# Patient Record
Sex: Male | Born: 1955 | Hispanic: No | Marital: Married | Smoking: Never smoker
Health system: Southern US, Community
[De-identification: ages and names within clinical notes are randomized; demographics above are authoritative.]

## PROBLEM LIST (undated history)

## (undated) DIAGNOSIS — I2510 Atherosclerotic heart disease of native coronary artery without angina pectoris: Secondary | ICD-10-CM

## (undated) DIAGNOSIS — I213 ST elevation (STEMI) myocardial infarction of unspecified site: Secondary | ICD-10-CM

## (undated) DIAGNOSIS — J302 Other seasonal allergic rhinitis: Secondary | ICD-10-CM

## (undated) DIAGNOSIS — I219 Acute myocardial infarction, unspecified: Secondary | ICD-10-CM

## (undated) DIAGNOSIS — E785 Hyperlipidemia, unspecified: Secondary | ICD-10-CM

## (undated) DIAGNOSIS — I10 Essential (primary) hypertension: Secondary | ICD-10-CM

## (undated) DIAGNOSIS — T7840XA Allergy, unspecified, initial encounter: Secondary | ICD-10-CM

## (undated) HISTORY — DX: Essential (primary) hypertension: I10

## (undated) HISTORY — DX: Other seasonal allergic rhinitis: J30.2

## (undated) HISTORY — DX: Hyperlipidemia, unspecified: E78.5

## (undated) HISTORY — DX: Allergy, unspecified, initial encounter: T78.40XA

## (undated) HISTORY — DX: Acute myocardial infarction, unspecified: I21.9

## (undated) HISTORY — DX: Atherosclerotic heart disease of native coronary artery without angina pectoris: I25.10

---

## 2000-08-23 HISTORY — PX: KNEE ARTHROSCOPY: SUR90

## 2005-07-01 ENCOUNTER — Ambulatory Visit (HOSPITAL_COMMUNITY): Admission: RE | Admit: 2005-07-01 | Discharge: 2005-07-01 | Payer: Self-pay | Admitting: Family Medicine

## 2006-01-12 ENCOUNTER — Ambulatory Visit (HOSPITAL_COMMUNITY): Admission: RE | Admit: 2006-01-12 | Discharge: 2006-01-12 | Payer: Self-pay | Admitting: Family Medicine

## 2006-03-01 ENCOUNTER — Ambulatory Visit (HOSPITAL_COMMUNITY): Admission: RE | Admit: 2006-03-01 | Discharge: 2006-03-01 | Payer: Self-pay | Admitting: Urology

## 2008-03-28 ENCOUNTER — Ambulatory Visit (HOSPITAL_COMMUNITY): Admission: RE | Admit: 2008-03-28 | Discharge: 2008-03-28 | Payer: Self-pay | Admitting: Family Medicine

## 2011-08-24 HISTORY — PX: HERNIA REPAIR: SHX51

## 2013-01-03 ENCOUNTER — Telehealth: Payer: Self-pay

## 2013-01-03 NOTE — Telephone Encounter (Signed)
LMOM to call. ( Pt was referred by Dr. Regino Schultze for screening colonoscopy).

## 2013-01-18 NOTE — Telephone Encounter (Signed)
LMOM to call.

## 2013-01-19 NOTE — Telephone Encounter (Signed)
Letter to pt and PCP.  

## 2015-01-01 ENCOUNTER — Telehealth: Payer: Self-pay | Admitting: *Deleted

## 2015-01-01 ENCOUNTER — Encounter: Payer: Self-pay | Admitting: Neurology

## 2015-01-01 ENCOUNTER — Ambulatory Visit (INDEPENDENT_AMBULATORY_CARE_PROVIDER_SITE_OTHER): Payer: BLUE CROSS/BLUE SHIELD | Admitting: Neurology

## 2015-01-01 VITALS — BP 139/86 | HR 81 | Ht 66.5 in | Wt 166.0 lb

## 2015-01-01 DIAGNOSIS — G20 Parkinson's disease: Secondary | ICD-10-CM

## 2015-01-01 DIAGNOSIS — R251 Tremor, unspecified: Secondary | ICD-10-CM

## 2015-01-01 MED ORDER — PROPRANOLOL HCL ER 80 MG PO CP24: 80.0000 mg | ORAL_CAPSULE | Freq: Every day | ORAL | Status: DC

## 2015-01-01 NOTE — Telephone Encounter (Signed)
Patient notes on [facility] desk.

## 2015-01-01 NOTE — Patient Instructions (Signed)
Overall you are doing fairly well but I do want to suggest a few things today:   Remember to drink plenty of fluid, eat healthy meals and do not skip any meals. Try to eat protein with a every meal and eat a healthy snack such as fruit or nuts in between meals. Try to keep a regular sleep-wake schedule and try to exercise daily, particularly in the form of walking, 20-30 minutes a day, if you can.   As far as your medications are concerned, I would like to suggest: Propranolol ER daily. Stop caffeine  As far as diagnostic testing: MRI of the brain, labs  I would like to see you back in 4 months, sooner if we need to. Please call us with any interim questions, concerns, problems, updates or refill requests.   Please also call us for any test results so we can go over those with you on the phone.  My clinical assistant and will answer any of your questions and relay your messages to me and also relay most of my messages to you.   Our phone number is [phone]. We also have an after hours call service for urgent matters and there is a physician on-call for urgent questions. For any emergencies you know to call 911 or go to the nearest emergency room

## 2015-01-01 NOTE — Progress Notes (Signed)
[facility]    Provider:  Dr Lucia GaskinsAhern Referring Provider: [name], Juan C, MD Primary Care Physician:  Juan RibasGOLDING, Juan CABOT, MD  CC:  Tremors  HPI:  Juan Schultz is a 59 y.o. male here as a referral from Dr. Hassan RowanKoberlein for Tremors  Just the left hand, been years. Patient unsure if it is worsening. He sometimes will shake so much wll spill drink. He drinks 2 cups caffeine a day and drinks it every night but more decaf or half caf. It is every day, can be mild or higher frequency. Usually a baseline tremor but worsens sometimes. Could be caffeine or anxiety. Still has good smell. No constipation, no dry mouth, no ED, no orthostatic hypotension. Shuffles sometimes. He feels it even at rest. Doesn't drink alcohol. No tremors in the family. Uses singular sparingly. No history of dopa blocking medications. No FHX parkinson's disease. No inciting factors, no previous head trauma.  Review of Systems: Patient complains of symptoms per HPI as well as the following symptoms: Fatigue, snoring, allergies. Pertinent negatives per HPI. All others negative.   History   Social History  . Marital Status: Married    Spouse Name: Juan Schultz  . Number of Children: 2  . Years of Education: College   Occupational History  . [employer]    Social History Main Topics  . Smoking status: Never Smoker   . Smokeless tobacco: Not on file  . Alcohol Use: No     Comment: Quit in 1981  . Drug Use: No  . Sexual Activity: Not on file   Other Topics Concern  . Not on file   Social History Narrative   Lives at home with wife and son.   Caffeine use: 1 cups coffee per day   Drinks tea occass    No family history on file.  Past Medical History  Diagnosis Date  . Seasonal allergies     Past Surgical History  Procedure Laterality Date  . Hernia repair  2013  . Knee arthroscopy Right 2002    Current Outpatient Prescriptions  Medication Sig Dispense Refill  . Montelukast  Sodium (SINGULAIR PO) Take 5 mg by mouth as needed.    Marland Kitchen. EPIPEN 2-PAK 0.3 MG/0.3ML SOAJ injection     . propranolol ER (INDERAL LA) 80 MG 24 hr capsule Take 1 capsule (80 mg total) by mouth daily. 30 capsule 6   No current facility-administered medications for this visit.    Allergies as of 01/01/2015 - Review Complete 01/01/2015  Allergen Reaction Noted  . Tequin [gatifloxacin]  01/01/2015    Vitals: BP 139/86 mmHg  Pulse 81  Ht 5' 6.5" (1.689 m)  Wt 166 lb (75.297 kg)  BMI 26.39 kg/m2 Last Weight:  Wt Readings from Last 1 Encounters:  01/01/15 166 lb (75.297 kg)   Last Height:   Ht Readings from Last 1 Encounters:  01/01/15 5' 6.5" (1.689 m)   Physical exam: Exam: Gen: NAD, conversant, well nourised, well groomed                     CV: RRR, no MRG. No Carotid Bruits. No peripheral edema, warm, nontender Eyes: Conjunctivae clear without exudates or hemorrhage  Neuro: Detailed Neurologic Exam  Speech:    Speech is normal; fluent and spontaneous with normal comprehension.  Cognition:    The patient is oriented to person, place, and time;     recent and remote memory intact;     language fluent;  normal attention, concentration,     fund of knowledge Cranial Nerves:    The pupils are equal, round, and reactive to light. The fundi are normal and spontaneous venous pulsations are present. Visual fields are full to finger confrontation. Extraocular movements are intact. Trigeminal sensation is intact and the muscles of mastication are normal. The face is symmetric. The palate elevates in the midline. Hearing intact. Voice is normal. Shoulder shrug is normal. The tongue has normal motion without fasciculations.   Coordination:     Normal rapid alternating movements.   Gait:    Heel-toe and tandem gait are normal.   Motor Observation:    Left > right postural tremor which increases with FTN. . Tone:    Normal muscle tone.    Posture:    Posture is normal.  normal erect    Strength:    Strength is V/V in the upper and lower limbs.      Sensation: intact to LT     Reflex Exam:  DTR's:    Deep tendon reflexes in the upper and lower extremities are normal bilaterally.   Toes:    The toes are downgoing bilaterally.   Clonus:    Clonus is absent.   Assessment/Plan:  59 year old male with left greater than right tremor. Drinks multiple cups of caffeine every day. Advised to stop caffeine one day and see if the tremor improves. No other signs or symptoms of parkinsonism. We'll try propranolol extended release. MRI of the brain. We'll order thyroid and copper studies.  Need recent labs from primary care, will request.   Naomie DeanAntonia Laurice Kimmons, MD  [facility] [address] [place], [place] [id number]  Phone [phone] Fax [phone]

## 2015-01-01 NOTE — Telephone Encounter (Signed)
Called patient and verified he had lab work done in our office this morning. He stated he did.

## 2015-01-02 ENCOUNTER — Telehealth: Payer: Self-pay

## 2015-01-02 NOTE — Telephone Encounter (Signed)
Pt was referred by Dr. Phillips OdorGolding for screening colonoscopy. He left VM he is ready to schedule. I called and LMOM to call.

## 2015-01-03 LAB — THYROID PANEL WITH TSH
Free Thyroxine Index: 2.1 (ref 1.2–4.9)
T3 Uptake Ratio: 30 % (ref 24–39)
T4, Total: 7.1 ug/dL (ref 4.5–12.0)
TSH: 3.42 u[IU]/mL (ref 0.450–4.500)

## 2015-01-03 LAB — COPPER, SERUM: Copper: 98 ug/dL (ref 72–166)

## 2015-01-03 LAB — CERULOPLASMIN: Ceruloplasmin: 23.7 mg/dL (ref 16.0–31.0)

## 2015-01-05 ENCOUNTER — Encounter: Payer: Self-pay | Admitting: Neurology

## 2015-01-07 ENCOUNTER — Telehealth: Payer: Self-pay | Admitting: Neurology

## 2015-01-07 ENCOUNTER — Telehealth: Payer: Self-pay

## 2015-01-07 NOTE — Telephone Encounter (Signed)
lvm to schedule mri

## 2015-01-07 NOTE — Telephone Encounter (Signed)
Patient was informed of normal labs, he has no questions at this time.  Thanks

## 2015-01-15 ENCOUNTER — Ambulatory Visit (INDEPENDENT_AMBULATORY_CARE_PROVIDER_SITE_OTHER): Payer: BLUE CROSS/BLUE SHIELD

## 2015-01-15 ENCOUNTER — Other Ambulatory Visit: Payer: BLUE CROSS/BLUE SHIELD

## 2015-01-15 DIAGNOSIS — G20 Parkinson's disease: Secondary | ICD-10-CM

## 2015-01-15 DIAGNOSIS — R251 Tremor, unspecified: Secondary | ICD-10-CM | POA: Diagnosis not present

## 2015-01-16 NOTE — Telephone Encounter (Signed)
LMOM to call.

## 2015-01-21 ENCOUNTER — Telehealth: Payer: Self-pay

## 2015-01-21 ENCOUNTER — Other Ambulatory Visit: Payer: Self-pay

## 2015-01-21 DIAGNOSIS — Z1211 Encounter for screening for malignant neoplasm of colon: Secondary | ICD-10-CM

## 2015-01-21 NOTE — Telephone Encounter (Signed)
Gastroenterology Pre-Procedure Review  Request Date: 01/16/2015 Requesting Physician: Dr. Phillips OdorGolding  PATIENT REVIEW QUESTIONS: The patient responded to the following health history questions as indicated:    1. Diabetes Melitis: no 2. Joint replacements in the past 12 months: no 3. Major health problems in the past 3 months: no 4. Has an artificial valve or MVP: no 5. Has a defibrillator: no 6. Has been advised in past to take antibiotics in advance of a procedure like teeth cleaning: no    MEDICATIONS & ALLERGIES:    Patient reports the following regarding taking any blood thinners:   Plavix? no Aspirin? no Coumadin? no  Patient confirms/reports the following medications:  Current Outpatient Prescriptions  Medication Sig Dispense Refill  . Montelukast Sodium (SINGULAIR PO) Take 5 mg by mouth as needed.    Marland Kitchen. EPIPEN 2-PAK 0.3 MG/0.3ML SOAJ injection     . propranolol ER (INDERAL LA) 80 MG 24 hr capsule Take 1 capsule (80 mg total) by mouth daily. (Patient not taking: Reported on 01/16/2015) 30 capsule 6   No current facility-administered medications for this visit.    Patient confirms/reports the following allergies:  Allergies  Allergen Reactions  . Tequin [Gatifloxacin]     Mouth got numb    No orders of the defined types were placed in this encounter.    AUTHORIZATION INFORMATION Primary Insurance:   ID #:  Group #:  Pre-Cert / Auth required:  Pre-Cert / Auth #:   Secondary Insurance:  ID #:   Group #:  Pre-Cert / Auth required: Pre-Cert / Auth #:  SCHEDULE INFORMATION: Procedure has been scheduled as follows:  Date:    03/14/2015                Time: 8:30 AM  [facility]  This Gastroenterology Pre-Precedure Review Form is being routed to the following provider(s): Jonette EvaSandi Fields, MD

## 2015-01-21 NOTE — Telephone Encounter (Signed)
Pt was informed of Normal MRI

## 2015-01-21 NOTE — Telephone Encounter (Signed)
VM left to inform patient of normal MRI, he been instructed to call office back.

## 2015-01-22 NOTE — Telephone Encounter (Signed)
SUPREP SPLIT DOSING- FULL LIQUID WITH BREAKFAST.   Full Liquid Diet A high-calorie, high-protein supplement should be used to meet your nutritional requirements when the full liquid diet is continued for more than 2 or 3 days. If this diet is to be used for an extended period of time (more than 7 days), a multivitamin should be considered.  Breads and Starches  Allowed: None are allowed except crackers WHOLE OR pureed (made into a thick, smooth soup) in soup.   Avoid: Any others.    Potatoes/Pasta/Rice  Allowed: ANY ITEM AS A SOUP OR SMALL PLATE OF MASHED POTATOES.       Vegetables  Allowed: Strained tomato or vegetable juice. Vegetables pureed in soup.   Avoid: Any others.    Fruit  Allowed: Any strained fruit juices and fruit drinks. Include 1 serving of citrus or vitamin C-enriched fruit juice daily.   Avoid: Any others.  Meat and Meat Substitutes  Allowed: Egg  Avoid: Any meat, fish, or fowl. All cheese.  Milk  Allowed: Milk beverages, including milk shakes and instant breakfast mixes. Smooth yogurt.   Avoid: Any others. Avoid dairy products if not tolerated.    Soups and Combination Foods  Allowed: Broth, strained cream soups. Strained, broth-based soups.   Avoid: Any others.    Desserts and Sweets  Allowed: flavored gelatin,plain ice cream, sherbet, smooth pudding, junket, fruit ices, frozen ice pops, pudding pops,, frozen fudge pops, chocolate syrup. Sugar, honey, jelly, syrup.   Avoid: Any others.  Fats and Oils  Allowed: Margarine, butter, cream, sour cream, oils.   Avoid: Any others.  Beverages  Allowed: All.   Avoid: None.  Condiments  Allowed: Iodized salt, pepper, spices, flavorings. Cocoa powder.   Avoid: Any others.    SAMPLE MEAL PLAN Breakfast   cup orange juice.   1 OR 2 EGGS   1 cup  milk.   1 cup beverage (coffee or tea).   Cream or sugar, if desired.    Midmorning Snack  2 SCRAMBLED OR HARD BOILED  EGG   Lunch  1 cup cream soup.    cup fruit juice.   1 cup milk.    cup custard.   1 cup beverage (coffee or tea).   Cream or sugar, if desired.    Midafternoon Snack  1 cup milk shake.  Dinner  1 cup cream soup.    cup fruit juice.   1 cup milk.    cup pudding.   1 cup beverage (coffee or tea).   Cream or sugar, if desired.  Evening Snack  1 cup supplement.  To increase calories, add sugar, cream, butter, or margarine if possible. Nutritional supplements will also increase the total calories.

## 2015-01-23 MED ORDER — NA SULFATE-K SULFATE-MG SULF 17.5-3.13-1.6 GM/177ML PO SOLN: 1.0000 | ORAL | Status: DC

## 2015-01-23 NOTE — Telephone Encounter (Signed)
Rx sent to the pharmacy and instructions mailed to pt.  

## 2015-01-31 ENCOUNTER — Ambulatory Visit: Payer: Self-pay | Admitting: Cardiology

## 2015-02-07 ENCOUNTER — Ambulatory Visit (INDEPENDENT_AMBULATORY_CARE_PROVIDER_SITE_OTHER): Payer: BLUE CROSS/BLUE SHIELD | Admitting: Internal Medicine

## 2015-02-07 VITALS — BP 104/76 | HR 78 | Ht 66.5 in | Wt 165.0 lb

## 2015-02-07 DIAGNOSIS — Z136 Encounter for screening for cardiovascular disorders: Secondary | ICD-10-CM

## 2015-02-07 DIAGNOSIS — Z8249 Family history of ischemic heart disease and other diseases of the circulatory system: Secondary | ICD-10-CM | POA: Diagnosis not present

## 2015-02-07 NOTE — Patient Instructions (Signed)
Your physician recommends that you schedule a follow-up appointment in: to be determined    Please schedule Cardiac CT for calcium scoring at the [facility] office      Thank you for choosing [facility] !

## 2015-02-07 NOTE — Progress Notes (Signed)
Cardiology Office Note   Date:  02/07/2015   ID:  Juan Schultz, DOB 1955-09-30, MRN [id number]  PCP:  Purvis Kilts, MD  Cardiologist:   Dorris Carnes, MD   No chief complaint on file.    Pt presents for cardiac risk assessment   History of Present Illness: Juan Schultz is a 59 y.o. male with no history of CAD  Denies CP  No SOB   Does have a fairly strong FHX  Mom had 2 mis in her 58s  Not a smoker  Dad had several stents.  Brother who is 33 years older has had 6 stents  Also nonsmoker Pt seen in internal  medcine recently  Recomm ASA 81 mg   Pt has not started     Current Outpatient Prescriptions  Medication Sig Dispense Refill  . EPIPEN 2-PAK 0.3 MG/0.3ML SOAJ injection     . Montelukast Sodium (SINGULAIR PO) Take 5 mg by mouth as needed.    . Na Sulfate-K Sulfate-Mg Sulf (SUPREP BOWEL PREP) SOLN Take 1 kit by mouth as directed. 1 Bottle 0  . propranolol ER (INDERAL LA) 80 MG 24 hr capsule Take 1 capsule (80 mg total) by mouth daily. 30 capsule 6   No current facility-administered medications for this visit.    Allergies:   Tequin   Past Medical History  Diagnosis Date  . Seasonal allergies     Past Surgical History  Procedure Laterality Date  . Hernia repair  2013  . Knee arthroscopy Right 2002     Social History:  The patient  reports that he has never smoked. He does not have any smokeless tobacco history on file. He reports that he does not drink alcohol or use illicit drugs.   Family History:  The patient's family history is negative for Parkinsonism.    ROS:  Please see the history of present illness. All other systems are reviewed and  Negative to the above problem except as noted.    PHYSICAL EXAM: VS:  BP 104/76 mmHg  Pulse 78  Ht 5' 6.5" (1.689 m)  Wt 165 lb (74.844 kg)  BMI 26.24 kg/m2  SpO2 96%  GEN: Well nourished, well developed, in no acute distress HEENT: normal Neck: no JVD, carotid bruits, or masses Cardiac: RRR; no  murmurs, rubs, or gallops,no edema  Respiratory:  clear to auscultation bilaterally, normal work of breathing GI: soft, nontender, nondistended, + BS  No hepatomegaly  MS: no deformity Moving all extremities   Skin: warm and dry, no rash Neuro:  Strength and sensation are intact Psych: euthymic mood, full affect   EKG:  EKG is ordered today.  SR 78   Lipid Panel No results found for: CHOL, TRIG, HDL, CHOLHDL, VLDL, LDLCALC, LDLDIRECT    Wt Readings from Last 3 Encounters:  02/07/15 165 lb (74.844 kg)  01/14/15 166 lb (75.297 kg)  01/01/15 166 lb (75.297 kg)      ASSESSMENT AND PLAN:   1.  Cardiac risk assessment.  Pt with strong FHx of CAD   His risk assessment is not that alarming  Lipids appear pretty good.  Reviewed with him  Note recomm for ASA which he has not started. I would recomm 1.  He find out more about his families lipids, risk factors.  ? If they also have good numbers  2.  I would recomm CT calcium score to observe degree fof plaquing.   Furhter recomm based on above  I did encourage  him to increase his physical activity   Discussed benefits.    Disposition:   FU will depend on test results    Signed, Dorris Carnes, MD  02/07/2015 8:37 AM    [address], [place], [place]  [address] Phone: [phone]; Fax: [phone]

## 2015-02-27 ENCOUNTER — Other Ambulatory Visit: Payer: Self-pay | Admitting: Internal Medicine

## 2015-02-27 ENCOUNTER — Ambulatory Visit
Admission: RE | Admit: 2015-02-27 | Discharge: 2015-02-27 | Disposition: A | Discharge status: Home / Self Care | Payer: BLUE CROSS/BLUE SHIELD | Source: Ambulatory Visit | Attending: Internal Medicine | Admitting: Internal Medicine

## 2015-02-27 ENCOUNTER — Ambulatory Visit (INDEPENDENT_AMBULATORY_CARE_PROVIDER_SITE_OTHER)
Admission: RE | Admit: 2015-02-27 | Discharge: 2015-02-27 | Disposition: A | Discharge status: Home / Self Care | Payer: BLUE CROSS/BLUE SHIELD | Source: Ambulatory Visit | Attending: Internal Medicine | Admitting: Internal Medicine

## 2015-02-27 DIAGNOSIS — Z8249 Family history of ischemic heart disease and other diseases of the circulatory system: Secondary | ICD-10-CM

## 2015-03-06 ENCOUNTER — Other Ambulatory Visit: Payer: Self-pay

## 2015-03-06 MED ORDER — ASPIRIN EC 81 MG PO TBEC: 81.0000 mg | DELAYED_RELEASE_TABLET | Freq: Every day | ORAL | Status: DC

## 2015-03-10 ENCOUNTER — Telehealth: Payer: Self-pay

## 2015-03-10 NOTE — Telephone Encounter (Signed)
I called pt to update triage prior to colonoscopy. He said he is not taking anything any different. He was prescribed Propanolol but he is not taking it.

## 2015-03-11 ENCOUNTER — Telehealth: Payer: Self-pay

## 2015-03-11 NOTE — Telephone Encounter (Signed)
I called [employer] @ [phone] and spoke to Vassie MoselleKeith P who said that a PA is not required for screening colonoscopy. Reference # [id number].

## 2015-03-11 NOTE — Telephone Encounter (Signed)
REVIEWED-NO ADDITIONAL RECOMMENDATIONS. 

## 2015-03-14 ENCOUNTER — Ambulatory Visit (HOSPITAL_COMMUNITY)
Admission: RE | Admit: 2015-03-14 | Discharge: 2015-03-14 | Disposition: A | Discharge status: Home / Self Care | Payer: BLUE CROSS/BLUE SHIELD | Source: Ambulatory Visit | Attending: Gastroenterology | Admitting: Gastroenterology

## 2015-03-14 ENCOUNTER — Encounter (HOSPITAL_COMMUNITY)
Admission: RE | Disposition: A | Discharge status: Home / Self Care | Payer: Self-pay | Source: Ambulatory Visit | Attending: Gastroenterology

## 2015-03-14 ENCOUNTER — Encounter (HOSPITAL_COMMUNITY): Payer: Self-pay

## 2015-03-14 ENCOUNTER — Telehealth: Payer: Self-pay

## 2015-03-14 DIAGNOSIS — K5730 Diverticulosis of large intestine without perforation or abscess without bleeding: Secondary | ICD-10-CM | POA: Diagnosis not present

## 2015-03-14 DIAGNOSIS — K644 Residual hemorrhoidal skin tags: Secondary | ICD-10-CM | POA: Diagnosis not present

## 2015-03-14 DIAGNOSIS — K6389 Other specified diseases of intestine: Secondary | ICD-10-CM | POA: Diagnosis not present

## 2015-03-14 DIAGNOSIS — Z79899 Other long term (current) drug therapy: Secondary | ICD-10-CM | POA: Diagnosis not present

## 2015-03-14 DIAGNOSIS — K648 Other hemorrhoids: Secondary | ICD-10-CM | POA: Diagnosis not present

## 2015-03-14 DIAGNOSIS — J302 Other seasonal allergic rhinitis: Secondary | ICD-10-CM | POA: Insufficient documentation

## 2015-03-14 DIAGNOSIS — Z1211 Encounter for screening for malignant neoplasm of colon: Secondary | ICD-10-CM | POA: Diagnosis not present

## 2015-03-14 DIAGNOSIS — Z7982 Long term (current) use of aspirin: Secondary | ICD-10-CM | POA: Diagnosis not present

## 2015-03-14 DIAGNOSIS — K621 Rectal polyp: Secondary | ICD-10-CM | POA: Insufficient documentation

## 2015-03-14 HISTORY — PX: COLONOSCOPY: SHX5424

## 2015-03-14 SURGERY — COLONOSCOPY
Anesthesia: Moderate Sedation

## 2015-03-14 MED ORDER — STERILE WATER FOR IRRIGATION IR SOLN
Status: DC | PRN
  Administered 2015-03-14: 08:00:00

## 2015-03-14 MED ORDER — MEPERIDINE HCL 100 MG/ML IJ SOLN
INTRAMUSCULAR | Status: DC | PRN
  Administered 2015-03-14 (×3): 25 mg via INTRAVENOUS

## 2015-03-14 MED ORDER — MEPERIDINE HCL 100 MG/ML IJ SOLN
INTRAMUSCULAR | Status: AC
  Filled 2015-03-14: qty 2

## 2015-03-14 MED ORDER — MIDAZOLAM HCL 5 MG/5ML IJ SOLN
INTRAMUSCULAR | Status: AC
  Filled 2015-03-14: qty 10

## 2015-03-14 MED ORDER — MIDAZOLAM HCL 5 MG/5ML IJ SOLN
INTRAMUSCULAR | Status: DC | PRN
  Administered 2015-03-14: 2 mg via INTRAVENOUS
  Administered 2015-03-14: 1 mg via INTRAVENOUS
  Administered 2015-03-14: 2 mg via INTRAVENOUS
  Administered 2015-03-14: 1 mg via INTRAVENOUS

## 2015-03-14 MED ORDER — SODIUM CHLORIDE 0.9 % IV SOLN
INTRAVENOUS | Status: DC
  Administered 2015-03-14: 08:00:00 via INTRAVENOUS

## 2015-03-14 NOTE — Telephone Encounter (Signed)
Called [facility] left message to call back so we can get a copy of lab work (Lipids) for Dr. Tenny Craw

## 2015-03-14 NOTE — Op Note (Signed)
[facility] [address] [place], [address]   COLONOSCOPY PROCEDURE REPORT  PATIENT: Juan Schultz, Juan Schultz  MR#: [id number] BIRTHDATE: [birth year] , 59  yrs. old GENDER: male ENDOSCOPIST: West Bali, MD REFERRED AO:ZHYQ Phillips Odor, M.D. PROCEDURE DATE:  03/22/2015 PROCEDURE:   Colonoscopy with cold biopsy polypectomy INDICATIONS:average risk patient for colorectal cancer. MEDICATIONS: Demerol 75 mg IV and Versed 6 mg IV  DESCRIPTION OF PROCEDURE:    Physical exam was performed.  Informed consent was obtained from the patient after explaining the benefits, risks, and alternatives to procedure.  The patient was connected to monitor and placed in left lateral position. Continuous oxygen was provided by nasal cannula and IV medicine administered through an indwelling cannula.  After administration of sedation and rectal exam, the patients rectum was intubated and the EC-[id number] ([id number])  colonoscope was advanced under direct visualization to the cecum.  The scope was removed slowly by carefully examining the color, texture, anatomy, and integrity mucosa on the way out.  The patient was recovered in endoscopy and discharged home in satisfactory condition. Estimated blood loss is zero unless otherwise noted in this procedure report.     COLON FINDINGS: There was mild diverticulosis noted throughout the entire examined colon with associated tortuosity.  , The examination was otherwise normal.  , A sessile polyp measuring 3 mm in size was found in the rectum.  A polypectomy was performed with cold forceps.  , and Small external and internal hemorrhoids were found.  PREP QUALITY: excellent.  CECAL W/D TIME: 11       minutes COMPLICATIONS: None  ENDOSCOPIC IMPRESSION: 1.   Mild diverticulosis throughout the entire examined colon 2.   ONE RECTAL POLYP REMOVED 3.   Small external and internal hemorrhoids  RECOMMENDATIONS: AWAIT BIOPSY HIGH FIBER DIET NEXT TCS IN  5-10 YEARS      _______________________________ eSignedWest Bali, MD Mar 22, 2015 10:32 AM    CPT CODES: ICD CODES:  The ICD and CPT codes recommended by this software are interpretations from the data that the clinical staff has captured with the software.  The verification of the translation of this report to the ICD and CPT codes and modifiers is the sole responsibility of the health care institution and practicing physician where this report was generated.  [employer] will not be held responsible for the validity of the ICD and CPT codes included on this report.  AMA assumes no liability for data contained or not contained herein. CPT is a Publishing rights manager of the [employer].

## 2015-03-14 NOTE — H&P (Signed)
  Primary Care Physician:  Purvis Kilts, MD Primary Gastroenterologist:  Dr. Oneida Alar  Pre-Procedure History & Physical: HPI:  Juan Schultz is a 59 y.o. male here for [place].  Past Medical History  Diagnosis Date  . Seasonal allergies     Past Surgical History  Procedure Laterality Date  . Hernia repair  2013  . Knee arthroscopy Right 2002    Prior to Admission medications   Medication Sig Start Date End Date Taking? Authorizing Provider  aspirin EC 81 MG tablet Take 1 tablet (81 mg total) by mouth daily. 03/06/15  Yes Fay Records, MD  montelukast (SINGULAIR) 10 MG tablet Take 10 mg by mouth daily as needed (allergies).   Yes Historical Provider, MD  Na Sulfate-K Sulfate-Mg Sulf (SUPREP BOWEL PREP) SOLN Take 1 kit by mouth as directed. 01/23/15  Yes Danie Binder, MD  OVER THE COUNTER MEDICATION Take 1 packet by mouth daily. "Green Vibrance" vitamin supplement   Yes Historical Provider, MD  Saw Palmetto, Serenoa repens, (SAW PALMETTO PO) Take 1 capsule by mouth daily.   Yes Historical Provider, MD    Allergies as of 01/21/2015 - Review Complete 01/16/2015  Allergen Reaction Noted  . Tequin [gatifloxacin]  01/01/2015    Family History  Problem Relation Age of Onset  . Parkinsonism Neg Hx     History   Social History  . Marital Status: Married    Spouse Name: [name]  . Number of Children: 2  . Years of Education: College   Occupational History  . [employer]    Social History Main Topics  . Smoking status: Never Smoker   . Smokeless tobacco: Not on file  . Alcohol Use: No     Comment: Quit in 1981  . Drug Use: No  . Sexual Activity: Not on file   Other Topics Concern  . Not on file   Social History Narrative   Lives at home with wife and son.   Caffeine use: 1 cups coffee per day   Drinks tea occass    Review of Systems: See HPI, otherwise negative ROS   Physical Exam: BP 133/83 mmHg  Temp(Src) 97.8 F (36.6 C) (Oral)   Ht $R'5\' 6"'Az$  (1.676 m)  Wt 168 lb (76.204 kg)  BMI 27.13 kg/m2  SpO2 99% General:   Alert,  pleasant and cooperative in NAD Head:  Normocephalic and atraumatic. Neck:  Supple; Lungs:  Clear throughout to auscultation.    Heart:  Regular rate and rhythm. Abdomen:  Soft, nontender and nondistended. Normal bowel sounds, without guarding, and without rebound.   Neurologic:  Alert and  oriented x4;  grossly normal neurologically.  Impression/Plan:     SCREENING  Plan:  1. TCS TODAY

## 2015-03-14 NOTE — Discharge Instructions (Signed)
You had 1 polyp removed. You have small internal hemorrhoids and diverticulosis IN YOUR RIGHT AND LEFT COLON.   FOLLOW A HIGH FIBER DIET. AVOID ITEMS THAT CAUSE BLOATING. SEE INFO BELOW.  YOUR BIOPSY RESULTS WILL BE AVAILABLE IN MY CHART AFTER JUL 26 AND MY OFFICE WILL CONTACT YOU IN 10-14 DAYS WITH YOUR RESULTS.   Next colonoscopy in 5-10 years.   Colonoscopy Care After Read the instructions outlined below and refer to this sheet in the next week. These discharge instructions provide you with general information on caring for yourself after you leave the hospital. While your treatment has been planned according to the most current medical practices available, unavoidable complications occasionally occur. If you have any problems or questions after discharge, call DR. Juliane Guest, [phone].  ACTIVITY  You may resume your regular activity, but move at a slower pace for the next 24 hours.   Take frequent rest periods for the next 24 hours.   Walking will help get rid of the air and reduce the bloated feeling in your belly (abdomen).   No driving for 24 hours (because of the medicine (anesthesia) used during the test).   You may shower.   Do not sign any important legal documents or operate any machinery for 24 hours (because of the anesthesia used during the test).    NUTRITION  Drink plenty of fluids.   You may resume your normal diet as instructed by your doctor.   Begin with a light meal and progress to your normal diet. Heavy or fried foods are harder to digest and may make you feel sick to your stomach (nauseated).   Avoid alcoholic beverages for 24 hours or as instructed.    MEDICATIONS  You may resume your normal medications.   WHAT YOU CAN EXPECT TODAY  Some feelings of bloating in the abdomen.   Passage of more gas than usual.   Spotting of blood in your stool or on the toilet paper  .  IF YOU HAD POLYPS REMOVED DURING THE COLONOSCOPY:  Eat a soft diet IF  YOU HAVE NAUSEA, BLOATING, ABDOMINAL PAIN, OR VOMITING.    FINDING OUT THE RESULTS OF YOUR TEST Not all test results are available during your visit. DR. Darrick Penna WILL CALL YOU WITHIN 14 DAYS OF YOUR PROCEDUE WITH YOUR RESULTS. Do not assume everything is normal if you have not heard from DR. Olando Willems, CALL HER OFFICE AT [phone].  SEEK IMMEDIATE MEDICAL ATTENTION AND CALL THE OFFICE: [phone] IF:  You have more than a spotting of blood in your stool.   Your belly is swollen (abdominal distention).   You are nauseated or vomiting.   You have a temperature over 101F.   You have abdominal pain or discomfort that is severe or gets worse throughout the day.  Polyps, Colon  A polyp is extra tissue that grows inside your body. Colon polyps grow in the large intestine. The large intestine, also called the colon, is part of your digestive system. It is a long, hollow tube at the end of your digestive tract where your body makes and stores stool. Most polyps are not dangerous. They are benign. This means they are not cancerous. But over time, some types of polyps can turn into cancer. Polyps that are smaller than a pea are usually not harmful. But larger polyps could someday become or may already be cancerous. To be safe, doctors remove all polyps and test them.   PREVENTION There is not one sure way to  prevent polyps. You might be able to lower your risk of getting them if you:  Eat more fruits and vegetables and less fatty food.   Do not smoke.   Avoid alcohol.   Exercise every day.   Lose weight if you are overweight.   Eating more calcium and folate can also lower your risk of getting polyps. Some foods that are rich in calcium are milk, cheese, and broccoli. Some foods that are rich in folate are chickpeas, kidney beans, and spinach.    High-Fiber Diet A high-fiber diet changes your normal diet to include more whole grains, legumes, fruits, and vegetables. Changes in the diet  involve replacing refined carbohydrates with unrefined foods. The calorie level of the diet is essentially unchanged. The Dietary Reference Intake (recommended amount) for adult males is 38 grams per day. For adult females, it is 25 grams per day. Pregnant and lactating women should consume 28 grams of fiber per day. Fiber is the intact part of a plant that is not broken down during digestion. Functional fiber is fiber that has been isolated from the plant to provide a beneficial effect in the body. PURPOSE  Increase stool bulk.   Ease and regulate bowel movements.   Lower cholesterol.  INDICATIONS THAT YOU NEED MORE FIBER  Constipation and hemorrhoids.   Uncomplicated diverticulosis (intestine condition) and irritable bowel syndrome.   Weight management.   As a protective measure against hardening of the arteries (atherosclerosis), diabetes, and cancer.   GUIDELINES FOR INCREASING FIBER IN THE DIET  Start adding fiber to the diet slowly. A gradual increase of about 5 more grams (2 slices of whole-wheat bread, 2 servings of most fruits or vegetables, or 1 bowl of high-fiber cereal) per day is best. Too rapid an increase in fiber may result in constipation, flatulence, and bloating.   Drink enough water and fluids to keep your urine clear or pale yellow. Water, juice, or caffeine-free drinks are recommended. Not drinking enough fluid may cause constipation.   Eat a variety of high-fiber foods rather than one type of fiber.   Try to increase your intake of fiber through using high-fiber foods rather than fiber pills or supplements that contain small amounts of fiber.   The goal is to change the types of food eaten. Do not supplement your present diet with high-fiber foods, but replace foods in your present diet.   INCLUDE A VARIETY OF FIBER SOURCES  Replace refined and processed grains with whole grains, canned fruits with fresh fruits, and incorporate other fiber sources. White rice,  white breads, and most bakery goods contain little or no fiber.   Brown whole-grain rice, buckwheat oats, and many fruits and vegetables are all good sources of fiber. These include: broccoli, Brussels sprouts, cabbage, cauliflower, beets, sweet potatoes, white potatoes (skin on), carrots, tomatoes, eggplant, squash, berries, fresh fruits, and dried fruits.   Cereals appear to be the richest source of fiber. Cereal fiber is found in whole grains and bran. Bran is the fiber-rich outer coat of cereal grain, which is largely removed in refining. In whole-grain cereals, the bran remains. In breakfast cereals, the largest amount of fiber is found in those with "bran" in their names. The fiber content is sometimes indicated on the label.   You may need to include additional fruits and vegetables each day.   In baking, for 1 cup white flour, you may use the following substitutions:   1 cup whole-wheat flour minus 2 tablespoons.   1/2  cup white flour plus 1/2 cup whole-wheat flour.    Diverticulosis Diverticulosis is a common condition that develops when small pouches (diverticula) form in the wall of the colon. The risk of diverticulosis increases with age. It happens more often in people who eat a low-fiber diet. Most individuals with diverticulosis have no symptoms. Those individuals with symptoms usually experience belly (abdominal) pain, constipation, or loose stools (diarrhea).  HOME CARE INSTRUCTIONS  Increase the amount of fiber in your diet as directed by your caregiver or dietician. This may reduce symptoms of diverticulosis.   Drink at least 6 to 8 glasses of water each day to prevent constipation.   Try not to strain when you have a bowel movement.   Avoiding nuts and seeds to prevent complications is NOT NECESSARY.   FOODS HAVING HIGH FIBER CONTENT INCLUDE:  Fruits. Apple, peach, pear, tangerine, raisins, prunes.   Vegetables. Brussels sprouts, asparagus, broccoli, cabbage,  carrot, cauliflower, romaine lettuce, spinach, summer squash, tomato, winter squash, zucchini.   Starchy Vegetables. Baked beans, kidney beans, lima beans, split peas, lentils, potatoes (with skin).   Grains. Whole wheat bread, brown rice, bran flake cereal, plain oatmeal, white rice, shredded wheat, bran muffins.   SEEK IMMEDIATE MEDICAL CARE IF:  You develop increasing pain or severe bloating.   You have an oral temperature above 101F.   You develop vomiting or bowel movements that are bloody or black.   Hemorrhoids Hemorrhoids are dilated (enlarged) veins around the rectum. Sometimes clots will form in the veins. This makes them swollen and painful. These are called thrombosed hemorrhoids. Causes of hemorrhoids include:  Constipation.   Straining to have a bowel movement.   HEAVY LIFTING HOME CARE INSTRUCTIONS  Eat a well balanced diet and drink 6 to 8 glasses of water every day to avoid constipation. You may also use a bulk laxative.   Avoid straining to have bowel movements.   Keep anal area dry and clean.   Do not use a donut shaped pillow or sit on the toilet for long periods. This increases blood pooling and pain.   Move your bowels when your body has the urge; this will require less straining and will decrease pain and pressure.

## 2015-03-17 ENCOUNTER — Encounter (HOSPITAL_COMMUNITY): Payer: Self-pay | Admitting: Gastroenterology

## 2015-03-17 NOTE — Telephone Encounter (Signed)
Spoke with medical records at [facility] they are faxing over copy of lab work today.

## 2015-03-18 ENCOUNTER — Telehealth: Payer: Self-pay | Admitting: Gastroenterology

## 2015-03-18 ENCOUNTER — Encounter: Payer: Self-pay | Admitting: Internal Medicine

## 2015-03-18 NOTE — Telephone Encounter (Signed)
Pt is aware of results. 

## 2015-03-18 NOTE — Telephone Encounter (Signed)
LMOM to call.

## 2015-03-18 NOTE — Telephone Encounter (Signed)
NIC for 10 TCS

## 2015-03-18 NOTE — Telephone Encounter (Signed)
Please call pt. He had A HYPERPLASTIC POLYP removed.   FOLLOW A HIGH FIBER DIET. AVOID ITEMS THAT CAUSE BLOATING.   Next colonoscopy in 10 years.

## 2015-03-19 NOTE — Progress Notes (Signed)
Would recomm 6 month trial of diet then recheck lipids

## 2015-05-05 ENCOUNTER — Ambulatory Visit: Payer: BLUE CROSS/BLUE SHIELD | Admitting: Neurology

## 2016-02-19 ENCOUNTER — Ambulatory Visit (HOSPITAL_COMMUNITY)
Admission: RE | Admit: 2016-02-19 | Discharge: 2016-02-19 | Disposition: A | Discharge status: Home / Self Care | Payer: BLUE CROSS/BLUE SHIELD | Source: Ambulatory Visit | Attending: Family Medicine | Admitting: Family Medicine

## 2016-02-19 ENCOUNTER — Other Ambulatory Visit (HOSPITAL_COMMUNITY): Payer: Self-pay | Admitting: Family Medicine

## 2016-02-19 DIAGNOSIS — R05 Cough: Secondary | ICD-10-CM | POA: Insufficient documentation

## 2016-02-19 DIAGNOSIS — J45909 Unspecified asthma, uncomplicated: Secondary | ICD-10-CM | POA: Diagnosis present

## 2016-02-19 DIAGNOSIS — J209 Acute bronchitis, unspecified: Secondary | ICD-10-CM

## 2016-04-23 ENCOUNTER — Ambulatory Visit: Payer: BLUE CROSS/BLUE SHIELD | Admitting: Internal Medicine

## 2019-08-13 ENCOUNTER — Ambulatory Visit: Payer: BC Managed Care – PPO | Attending: Internal Medicine

## 2019-08-13 ENCOUNTER — Other Ambulatory Visit: Payer: Self-pay

## 2019-08-13 DIAGNOSIS — Z20822 Contact with and (suspected) exposure to covid-19: Secondary | ICD-10-CM

## 2019-08-14 LAB — NOVEL CORONAVIRUS, NAA: SARS-CoV-2, NAA: NOT DETECTED

## 2019-11-19 ENCOUNTER — Other Ambulatory Visit: Payer: Self-pay

## 2019-11-19 ENCOUNTER — Other Ambulatory Visit (HOSPITAL_COMMUNITY): Payer: Self-pay | Admitting: Internal Medicine

## 2019-11-19 ENCOUNTER — Ambulatory Visit (HOSPITAL_COMMUNITY)
Admission: RE | Admit: 2019-11-19 | Discharge: 2019-11-19 | Disposition: A | Discharge status: Home / Self Care | Payer: BC Managed Care – PPO | Source: Ambulatory Visit | Attending: Internal Medicine | Admitting: Internal Medicine

## 2019-11-19 DIAGNOSIS — R059 Cough, unspecified: Secondary | ICD-10-CM

## 2019-11-19 DIAGNOSIS — R05 Cough: Secondary | ICD-10-CM | POA: Diagnosis present

## 2019-12-03 ENCOUNTER — Other Ambulatory Visit: Payer: Self-pay

## 2019-12-03 ENCOUNTER — Other Ambulatory Visit (HOSPITAL_COMMUNITY): Payer: Self-pay | Admitting: Internal Medicine

## 2019-12-03 ENCOUNTER — Other Ambulatory Visit: Payer: Self-pay | Admitting: Internal Medicine

## 2019-12-03 ENCOUNTER — Ambulatory Visit (HOSPITAL_COMMUNITY)
Admission: RE | Admit: 2019-12-03 | Discharge: 2019-12-03 | Disposition: A | Discharge status: Home / Self Care | Payer: BC Managed Care – PPO | Source: Ambulatory Visit | Attending: Internal Medicine | Admitting: Internal Medicine

## 2019-12-03 DIAGNOSIS — R0600 Dyspnea, unspecified: Secondary | ICD-10-CM

## 2019-12-03 DIAGNOSIS — R079 Chest pain, unspecified: Secondary | ICD-10-CM

## 2019-12-03 LAB — POCT I-STAT CREATININE: Creatinine, Ser: 1.2 mg/dL (ref 0.61–1.24)

## 2019-12-03 MED ORDER — IOHEXOL 350 MG/ML SOLN
100.0000 mL | Freq: Once | INTRAVENOUS | Status: AC | PRN
  Administered 2019-12-03: 100 mL via INTRAVENOUS

## 2020-04-17 ENCOUNTER — Encounter: Payer: Self-pay | Admitting: Nurse Practitioner

## 2020-04-17 ENCOUNTER — Telehealth (HOSPITAL_COMMUNITY): Payer: Self-pay | Admitting: Nurse Practitioner

## 2020-04-17 DIAGNOSIS — U071 COVID-19: Secondary | ICD-10-CM

## 2020-04-17 NOTE — Telephone Encounter (Signed)
Called to discuss with Juan Schultz about Covid symptoms and the use of regeneron, a monoclonal antibody infusion for those with mild to moderate Covid symptoms and at a high risk of hospitalization.     Pt is qualified for this infusion at the [facility] infusion center due to co-morbid conditions and/or a member of an at-risk group, however declines infusion at this time. Symptoms tier reviewed as well as criteria for ending isolation.  Symptoms reviewed that would warrant ED/Hospital evaluation. Preventative practices reviewed. Patient verbalized understanding. Patient advised to call back if he/she opts to proceed with infusion. Callback number provided. Urgent care and/or ER precautions given for severe symptoms. Last date eligible for infusion was today. Patient says he's starting to feel better.   Patient Active Problem List   Diagnosis Date Noted  . Special screening for malignant neoplasms, colon   . Tremor 01/01/2015   Consuello Masse, NP [facility]  [phone] [email or web address]

## 2020-04-17 NOTE — Telephone Encounter (Signed)
Called to Discuss with patient about Covid symptoms and the use of regeneron, a monoclonal antibody infusion for those with mild to moderate Covid symptoms and at a high risk of hospitalization.     Pt is qualified for this infusion at the [facility] infusion center due to co-morbid conditions and/or a member of an at-risk group.     Unable to reach pt. Left message to return call. Sent mychart message.   Adrianna Dudas, DNP, AGNP-C [phone] (Infusion Center Hotline)  

## 2020-11-26 ENCOUNTER — Encounter: Payer: Self-pay | Admitting: Emergency Medicine

## 2020-11-26 ENCOUNTER — Ambulatory Visit
Admission: EM | Admit: 2020-11-26 | Discharge: 2020-11-26 | Disposition: A | Discharge status: Home / Self Care | Payer: BC Managed Care – PPO | Attending: Emergency Medicine | Admitting: Emergency Medicine

## 2020-11-26 ENCOUNTER — Other Ambulatory Visit: Payer: Self-pay

## 2020-11-26 DIAGNOSIS — S6991XA Unspecified injury of right wrist, hand and finger(s), initial encounter: Secondary | ICD-10-CM

## 2020-11-26 DIAGNOSIS — Z23 Encounter for immunization: Secondary | ICD-10-CM | POA: Diagnosis not present

## 2020-11-26 DIAGNOSIS — L089 Local infection of the skin and subcutaneous tissue, unspecified: Secondary | ICD-10-CM | POA: Diagnosis not present

## 2020-11-26 DIAGNOSIS — S60921A Unspecified superficial injury of right hand, initial encounter: Secondary | ICD-10-CM

## 2020-11-26 MED ORDER — TETANUS-DIPHTH-ACELL PERTUSSIS 5-2.5-18.5 LF-MCG/0.5 IM SUSY
0.5000 mL | PREFILLED_SYRINGE | Freq: Once | INTRAMUSCULAR | Status: AC
  Administered 2020-11-26: 0.5 mL via INTRAMUSCULAR

## 2020-11-26 MED ORDER — DOXYCYCLINE HYCLATE 100 MG PO CAPS: 100.0000 mg | ORAL_CAPSULE | Freq: Two times a day (BID) | ORAL | 0 refills | Status: DC

## 2020-11-26 NOTE — ED Provider Notes (Signed)
[facility]   [id number] 11/26/20 Arrival Time: 1727  CC: Hand infection  SUBJECTIVE:  Juan Schultz is a 65 y.o. male who presents with hand swelling and redness that he noticed today.  Cut hand 4 days ago with hand saw.  Has been applying neosporin without relief.  Denies similar symptoms in the past.  Denies fever, chills, nausea, vomiting, redness, swelling, purulent drainage, decrease strength or sensation.   Td UTD: Unknown.  ROS: As per HPI.  All other pertinent ROS negative.     Past Medical History:  Diagnosis Date  . Seasonal allergies    Past Surgical History:  Procedure Laterality Date  . COLONOSCOPY N/A 03/14/2015   Procedure: COLONOSCOPY;  Surgeon: West Bali, MD;  [facility];  Service: Endoscopy;  Laterality: N/A;  8:30 AM  . HERNIA REPAIR  2013  . KNEE ARTHROSCOPY Right 2002   Allergies  Allergen Reactions  . Tequin [Gatifloxacin]     Mouth got numb   No current facility-administered medications on file prior to encounter.   Current Outpatient Medications on File Prior to Encounter  Medication Sig Dispense Refill  . aspirin EC 81 MG tablet Take 1 tablet (81 mg total) by mouth daily.    . montelukast (SINGULAIR) 10 MG tablet Take 10 mg by mouth daily as needed (allergies).    Marland Kitchen OVER THE COUNTER MEDICATION Take 1 packet by mouth daily. "Green Vibrance" vitamin supplement    . Saw Palmetto, Serenoa repens, (SAW PALMETTO PO) Take 1 capsule by mouth daily.     Social History   Socioeconomic History  . Marital status: Married    Spouse name: Clydie Braun  . Number of children: 2  . Years of education: College  . Highest education level: Not on file  Occupational History  . Occupation: Programme researcher, broadcasting/film/video  Tobacco Use  . Smoking status: Never Smoker  . Smokeless tobacco: Never Used  Substance and Sexual Activity  . Alcohol use: No    Alcohol/week: 0.0 standard drinks    Comment: Quit in 1981  . Drug use: No  . Sexual activity: Not  on file  Other Topics Concern  . Not on file  Social History Narrative   Lives at home with wife and son.   Caffeine use: 1 cups coffee per day   Drinks tea occass   Social Determinants of Health   Financial Resource Strain: Not on file  Food Insecurity: Not on file  Transportation Needs: Not on file  Physical Activity: Not on file  Stress: Not on file  Social Connections: Not on file  Intimate Partner Violence: Not on file   Family History  Problem Relation Age of Onset  . Parkinsonism Neg Hx    OBJECTIVE:  Vitals:   11/26/20 1749  BP: (!) 143/94  Pulse: 77  Resp: 18  Temp: 97.7 F (36.5 C)  TempSrc: Oral  SpO2: 96%    General appearance: alert; no distress CV: Radial pulse 2+ Thumb: strength and sensation intact Skin: 1-2 cm superficial healing laceration to distal lateral aspect of RT second MC, small superficial laceration to distal tip of first digit, erythema and swelling diffuse about the lateral aspect of hand, specifically dorsum of hand, TTP over distal first digit, no obvious drainage or bleeding Psychological: alert and cooperative; normal mood and affect  ASSESSMENT & PLAN:  1. Infected superficial injury of right hand, initial encounter   2. Injury of finger of right hand, initial encounter  Meds ordered this encounter  Medications  . doxycycline (VIBRAMYCIN) 100 MG capsule    Sig: Take 1 capsule (100 mg total) by mouth 2 (two) times daily.    Dispense:  20 capsule    Refill:  0    Order Specific Question:   Supervising Provider    Answer:   Eustace Moore [[id number]]  . Tdap (BOOSTRIX) injection 0.5 mL   Wash with warm water and mild soap Prescribed doxycycline take as directed and to completion Continue to alternate ibuprofen and tylenol as needed for pain and fever Follow up with PCP if symptoms persists Return or go to the ED if you have any new or worsening symptoms such as increased pain, redness, swelling, discharge, high fever,  night sweats, abdominal pain, etc...    Reviewed expectations re: course of current medical issues. Questions answered. Outlined signs and symptoms indicating need for more acute intervention. Patient verbalized understanding. After Visit Summary given.   Rennis Harding, PA-C 11/26/20 1750

## 2020-11-26 NOTE — ED Triage Notes (Signed)
Cut to RT thumb on sat that is now red and swollen

## 2020-11-26 NOTE — Discharge Instructions (Signed)
Wash with warm water and mild soap °Prescribed doxycycline take as directed and to completion °Continue to alternate ibuprofen and tylenol as needed for pain and fever °Follow up with PCP if symptoms persists °Return or go to the ED if you have any new or worsening symptoms such as increased pain, redness, swelling, discharge, high fever, night sweats, abdominal pain, etc...  °

## 2020-11-27 ENCOUNTER — Encounter: Payer: Self-pay | Admitting: Emergency Medicine

## 2020-11-27 ENCOUNTER — Ambulatory Visit
Admission: EM | Admit: 2020-11-27 | Discharge: 2020-11-27 | Disposition: A | Discharge status: Home / Self Care | Payer: BC Managed Care – PPO | Attending: Emergency Medicine | Admitting: Emergency Medicine

## 2020-11-27 DIAGNOSIS — S61011D Laceration without foreign body of right thumb without damage to nail, subsequent encounter: Secondary | ICD-10-CM

## 2020-11-27 DIAGNOSIS — L089 Local infection of the skin and subcutaneous tissue, unspecified: Secondary | ICD-10-CM | POA: Diagnosis not present

## 2020-11-27 DIAGNOSIS — L03129 Acute lymphangitis of unspecified part of limb: Secondary | ICD-10-CM

## 2020-11-27 DIAGNOSIS — Z5189 Encounter for other specified aftercare: Secondary | ICD-10-CM

## 2020-11-27 MED ORDER — CEFTRIAXONE SODIUM 1 G IJ SOLR
1.0000 g | Freq: Once | INTRAMUSCULAR | Status: AC
  Administered 2020-11-27: 1 g via INTRAMUSCULAR

## 2020-11-27 NOTE — ED Provider Notes (Signed)
[facility]   [id number] 11/27/20 Arrival Time: 0850  CC: Wound check  SUBJECTIVE:  Juan Schultz is a 65 y.o. male who presents for wound check.  Laceration to left hand over weekend.  Presented to UC yesterday for thumb pain, redness, and swelling.  Prescribed doxycycline to cover for infection.  Has taken two doses.  Today noticed red streaking up arm and low back pain.  Concern for reaction to doxycycline.  Denies fever, chills, nausea, vomiting, purulent drainage, decrease sensation.   ROS: As per HPI.  All other pertinent ROS negative.     Past Medical History:  Diagnosis Date  . Seasonal allergies    Past Surgical History:  Procedure Laterality Date  . COLONOSCOPY N/A 03/14/2015   Procedure: COLONOSCOPY;  Surgeon: West Bali, MD;  Location: [facility];  Service: Endoscopy;  Laterality: N/A;  8:30 AM  . HERNIA REPAIR  2013  . KNEE ARTHROSCOPY Right 2002   Allergies  Allergen Reactions  . Tequin [Gatifloxacin]     Mouth got numb   No current facility-administered medications on file prior to encounter.   Current Outpatient Medications on File Prior to Encounter  Medication Sig Dispense Refill  . aspirin EC 81 MG tablet Take 1 tablet (81 mg total) by mouth daily.    Marland Kitchen doxycycline (VIBRAMYCIN) 100 MG capsule Take 1 capsule (100 mg total) by mouth 2 (two) times daily. 20 capsule 0  . montelukast (SINGULAIR) 10 MG tablet Take 10 mg by mouth daily as needed (allergies).    Marland Kitchen OVER THE COUNTER MEDICATION Take 1 packet by mouth daily. "Green Vibrance" vitamin supplement    . Saw Palmetto, Serenoa repens, (SAW PALMETTO PO) Take 1 capsule by mouth daily.     Social History   Socioeconomic History  . Marital status: Married    Spouse name: Clydie Braun  . Number of children: 2  . Years of education: College  . Highest education level: Not on file  Occupational History  . Occupation: Programme researcher, broadcasting/film/video  Tobacco Use  . Smoking status: Never Smoker  . Smokeless  tobacco: Never Used  Substance and Sexual Activity  . Alcohol use: No    Alcohol/week: 0.0 standard drinks    Comment: Quit in 1981  . Drug use: No  . Sexual activity: Not on file  Other Topics Concern  . Not on file  Social History Narrative   Lives at home with wife and son.   Caffeine use: 1 cups coffee per day   Drinks tea occass   Social Determinants of Health   Financial Resource Strain: Not on file  Food Insecurity: Not on file  Transportation Needs: Not on file  Physical Activity: Not on file  Stress: Not on file  Social Connections: Not on file  Intimate Partner Violence: Not on file   Family History  Problem Relation Age of Onset  . Parkinsonism Neg Hx      OBJECTIVE:  Vitals:   11/27/20 0918  BP: 131/78  Pulse: 89  Resp: 16  Temp: 98.1 F (36.7 C)  TempSrc: Oral  SpO2: 95%     General appearance: alert; no distress CV: radial pulse 2+, cap refill <2 seconds about thumb Hand: ROM intact about base of thumb, somewhat limited about the distal tip of thumb Skin: healing laceration to distal lateral aspect of RT second MC; erythematous streaking up anterior forearm towards RT axilla, thumb with erythema and swelling (see pictures below) Psychological: alert and cooperative; normal mood  and affect         ASSESSMENT & PLAN:  1. Laceration of right thumb with infection, subsequent encounter   2. Visit for wound check   3. Acute lymphangitis of multiple sites of upper arm     Meds ordered this encounter  Medications  . cefTRIAXone (ROCEPHIN) injection 1 g    1 gram rocephin given in office Continue with doxycycline Wash with warm water and mild soap Continue with OTC ibuprofen or tylenol as needed for pain relief If streaking does not improve or worsens (continues to go up arm) please go to the ED for further evaluation and management.  You may need IV antibiotics Otherwise follow up with PCP for recheck next week Go to the ED if you have  any new or worsening symptoms such as increased pain, swelling, drainage, decreased range of motion of extremity, fever, nausea, vomiting, flu-like symptoms, etc..     Reviewed expectations re: course of current medical issues. Questions answered. Outlined signs and symptoms indicating need for more acute intervention. Patient verbalized understanding. After Visit Summary given.   Rennis Harding, PA-C 11/27/20 [phone]

## 2020-11-27 NOTE — Discharge Instructions (Signed)
1 gram rocephin given in office Continue with doxycycline Wash with warm water and mild soap Continue with OTC ibuprofen or tylenol as needed for pain relief If streaking does not improve or worsens (continues to go up arm) please go to the ED for further evaluation and management.  You may need IV antibiotics Otherwise follow up with PCP for recheck next week Go to the ED if you have any new or worsening symptoms such as increased pain, swelling, drainage, decreased range of motion of extremity, fever, nausea, vomiting, flu-like symptoms, etc..

## 2020-11-27 NOTE — ED Triage Notes (Signed)
Pt was seen here yesterday.  Now has red streaks going up his forearm.

## 2021-04-22 ENCOUNTER — Other Ambulatory Visit (HOSPITAL_COMMUNITY): Payer: Self-pay | Admitting: Family Medicine

## 2021-04-22 ENCOUNTER — Other Ambulatory Visit: Payer: Self-pay

## 2021-04-22 ENCOUNTER — Ambulatory Visit (HOSPITAL_COMMUNITY)
Admission: RE | Admit: 2021-04-22 | Discharge: 2021-04-22 | Disposition: A | Discharge status: Home / Self Care | Payer: BC Managed Care – PPO | Source: Ambulatory Visit | Attending: Family Medicine | Admitting: Family Medicine

## 2021-04-22 DIAGNOSIS — M25561 Pain in right knee: Secondary | ICD-10-CM

## 2021-04-22 DIAGNOSIS — M92513 Juvenile osteochondrosis of proximal tibia, bilateral: Secondary | ICD-10-CM

## 2021-09-26 ENCOUNTER — Encounter: Payer: Self-pay | Admitting: Emergency Medicine

## 2021-09-26 ENCOUNTER — Other Ambulatory Visit: Payer: Self-pay

## 2021-09-26 ENCOUNTER — Ambulatory Visit
Admission: EM | Admit: 2021-09-26 | Discharge: 2021-09-26 | Disposition: A | Discharge status: Home / Self Care | Payer: BC Managed Care – PPO | Attending: Urgent Care | Admitting: Urgent Care

## 2021-09-26 DIAGNOSIS — Z20822 Contact with and (suspected) exposure to covid-19: Secondary | ICD-10-CM

## 2021-09-26 NOTE — ED Triage Notes (Signed)
Here for covid test only 

## 2021-09-27 LAB — NOVEL CORONAVIRUS, NAA: SARS-CoV-2, NAA: NOT DETECTED

## 2021-09-27 LAB — SARS-COV-2, NAA 2 DAY TAT

## 2021-10-29 ENCOUNTER — Ambulatory Visit
Admission: EM | Admit: 2021-10-29 | Discharge: 2021-10-29 | Disposition: A | Discharge status: Home / Self Care | Payer: BC Managed Care – PPO | Attending: Urgent Care | Admitting: Urgent Care

## 2021-10-29 ENCOUNTER — Other Ambulatory Visit: Payer: Self-pay

## 2021-10-29 ENCOUNTER — Ambulatory Visit (INDEPENDENT_AMBULATORY_CARE_PROVIDER_SITE_OTHER): Payer: BC Managed Care – PPO

## 2021-10-29 DIAGNOSIS — R070 Pain in throat: Secondary | ICD-10-CM

## 2021-10-29 DIAGNOSIS — J4520 Mild intermittent asthma, uncomplicated: Secondary | ICD-10-CM

## 2021-10-29 DIAGNOSIS — R0982 Postnasal drip: Secondary | ICD-10-CM | POA: Diagnosis not present

## 2021-10-29 DIAGNOSIS — R051 Acute cough: Secondary | ICD-10-CM | POA: Diagnosis not present

## 2021-10-29 DIAGNOSIS — J309 Allergic rhinitis, unspecified: Secondary | ICD-10-CM | POA: Diagnosis not present

## 2021-10-29 DIAGNOSIS — R059 Cough, unspecified: Secondary | ICD-10-CM | POA: Diagnosis not present

## 2021-10-29 MED ORDER — PREDNISONE 20 MG PO TABS: ORAL_TABLET | ORAL | 0 refills | Status: DC

## 2021-10-29 MED ORDER — LEVOCETIRIZINE DIHYDROCHLORIDE 5 MG PO TABS: 5.0000 mg | ORAL_TABLET | Freq: Every evening | ORAL | 0 refills | Status: DC

## 2021-10-29 MED ORDER — PROMETHAZINE-DM 6.25-15 MG/5ML PO SYRP
5.0000 mL | ORAL_SOLUTION | Freq: Four times a day (QID) | ORAL | 0 refills | Status: DC | PRN
Start: 2021-10-29 — End: 2021-11-21

## 2021-10-29 NOTE — ED Provider Notes (Signed)
?[facility] ? ? ?MRN: [id number] DOB: 29-Apr-1956 ? ?Subjective:  ? ?Juan Schultz is a 66 y.o. male presenting for 1 day history of acute onset persistent cough, sinus drainage and scratchy throat.  Has a history of allergic rhinitis and asthma.  He also has a history of frequent viral infections, pneumonia and bronchitis.  Would like to make sure that this is not the case.  No active chest pain, shortness of breath or wheezing. ? ?No current facility-administered medications for this encounter. ? ?Current Outpatient Medications:  ?  aspirin EC 81 MG tablet, Take 1 tablet (81 mg total) by mouth daily., Disp: , Rfl:  ?  montelukast (SINGULAIR) 10 MG tablet, Take 10 mg by mouth daily as needed (allergies)., Disp: , Rfl:  ?  OVER THE COUNTER MEDICATION, Take 1 packet by mouth daily. "Green Vibrance" vitamin supplement, Disp: , Rfl:  ?  Saw Palmetto, Serenoa repens, (SAW PALMETTO PO), Take 1 capsule by mouth daily., Disp: , Rfl:   ? ?Allergies  ?Allergen Reactions  ? Tequin [Gatifloxacin]   ?  Mouth got numb  ? ? ?Past Medical History:  ?Diagnosis Date  ? Seasonal allergies   ?  ? ?Past Surgical History:  ?Procedure Laterality Date  ? COLONOSCOPY N/A 03/14/2015  ? Procedure: COLONOSCOPY;  Surgeon: West Bali, MD;  Location: [facility];  Service: Endoscopy;  Laterality: N/A;  8:30 AM  ? HERNIA REPAIR  2013  ? KNEE ARTHROSCOPY Right 2002  ? ? ?Family History  ?Problem Relation Age of Onset  ? Parkinsonism Neg Hx   ? ? ?Social History  ? ?Tobacco Use  ? Smoking status: Never  ? Smokeless tobacco: Never  ?Substance Use Topics  ? Alcohol use: No  ?  Alcohol/week: 0.0 standard drinks  ?  Comment: Quit in 1981  ? Drug use: No  ? ? ?ROS ? ? ?Objective:  ? ?Vitals: ?BP 130/81   Pulse 85   Temp 98.2 ?F (36.8 ?C) (Oral)   Resp 18   SpO2 95%  ? ?Physical Exam ?Constitutional:   ?   General: He is not in acute distress. ?   Appearance: Normal appearance. He is well-developed. He is not ill-appearing,  toxic-appearing or diaphoretic.  ?HENT:  ?   Head: Normocephalic and atraumatic.  ?   Right Ear: External ear normal.  ?   Left Ear: External ear normal.  ?   Nose: Rhinorrhea present. No congestion.  ?   Mouth/Throat:  ?   Mouth: Mucous membranes are moist.  ?   Pharynx: No oropharyngeal exudate or posterior oropharyngeal erythema.  ?   Comments: Like postnasal drainage overlying pharynx. ?Eyes:  ?   General: No scleral icterus.    ?   Right eye: No discharge.     ?   Left eye: No discharge.  ?   Extraocular Movements: Extraocular movements intact.  ?Cardiovascular:  ?   Rate and Rhythm: Normal rate and regular rhythm.  ?   Heart sounds: Normal heart sounds. No murmur heard. ?  No friction rub. No gallop.  ?Pulmonary:  ?   Effort: Pulmonary effort is normal. No respiratory distress.  ?   Breath sounds: Normal breath sounds. No stridor. No wheezing, rhonchi or rales.  ?Neurological:  ?   Mental Status: He is alert and oriented to person, place, and time.  ?Psychiatric:     ?   Mood and Affect: Mood normal.     ?   Behavior:  Behavior normal.     ?   Thought Content: Thought content normal.  ? ?DG Chest 2 View ? ?Result Date: 10/29/2021 ?CLINICAL DATA:  Cough for 5 days.  History of pneumonia. EXAM: CHEST - 2 VIEW COMPARISON:  Chest two views 11/19/2019 FINDINGS: Cardiac silhouette and mediastinal contours are within normal limits. The lungs are clear. No pleural effusion or pneumothorax. No acute skeletal abnormality. IMPRESSION: No active cardiopulmonary disease. Electronically Signed   By: Neita Garnet M.D.   On: 10/29/2021 10:03   ? ?Assessment and Plan :  ? ?PDMP not reviewed this encounter. ? ?1. Acute cough   ?2. Post-nasal drainage   ?3. Throat discomfort   ?4. Allergic rhinitis, unspecified seasonality, unspecified trigger   ?5. Mild intermittent asthma without complication   ? ? Xray negative.  In the context of his asthma, allergic rhinitis offered an oral prednisone course.  Recommended Xyzal long-term.   Use supportive care otherwise. Counseled patient on potential for adverse effects with medications prescribed/recommended today, ER and return-to-clinic precautions discussed, patient verbalized understanding. ? ?  ?Wallis Bamberg, PA-C ?10/29/21 1008 ? ?

## 2021-10-29 NOTE — ED Triage Notes (Signed)
Pt presents with c/o cough for past week  

## 2021-10-31 ENCOUNTER — Ambulatory Visit
Admission: EM | Admit: 2021-10-31 | Discharge: 2021-10-31 | Disposition: A | Discharge status: Home / Self Care | Payer: BC Managed Care – PPO | Attending: Family Medicine | Admitting: Family Medicine

## 2021-10-31 ENCOUNTER — Encounter: Payer: Self-pay | Admitting: Emergency Medicine

## 2021-10-31 ENCOUNTER — Other Ambulatory Visit: Payer: Self-pay

## 2021-10-31 DIAGNOSIS — J069 Acute upper respiratory infection, unspecified: Secondary | ICD-10-CM | POA: Diagnosis not present

## 2021-10-31 DIAGNOSIS — R509 Fever, unspecified: Secondary | ICD-10-CM | POA: Diagnosis not present

## 2021-10-31 DIAGNOSIS — Z1152 Encounter for screening for COVID-19: Secondary | ICD-10-CM | POA: Diagnosis not present

## 2021-10-31 MED ORDER — MOLNUPIRAVIR EUA 200MG CAPSULE: 4.0000 | ORAL_CAPSULE | Freq: Two times a day (BID) | ORAL | 0 refills | Status: AC

## 2021-10-31 NOTE — ED Triage Notes (Signed)
Pt reports was seen for same on Thursday and reports fever, chills that started on Friday. Pt reports work is requiring PCR covid. ? ?

## 2021-10-31 NOTE — ED Provider Notes (Signed)
?[facility] ? ? ? ?CSN: [id number] ?Arrival date & time: 10/31/21  1031 ? ? ?  ? ?History   ?Chief Complaint ?Chief Complaint  ?Patient presents with  ? Chills  ? ? ?HPI ?Juan Schultz is a 66 y.o. male.  ? ?Presenting today with 3-day history of progressively worsening congestion, cough, fever, chills, body aches, fatigue.  Denies chest pain, shortness of breath, abdominal pain, nausea vomiting or diarrhea.  Taking sinus medications, cough medication and allergy regimen with mild relief of symptoms.  States his work is requiring a COVID PCR test given the progression of symptoms.  No known history of chronic pulmonary disease. ? ? ?Past Medical History:  ?Diagnosis Date  ? Seasonal allergies   ? ? ?Patient Active Problem List  ? Diagnosis Date Noted  ? Special screening for malignant neoplasms, colon   ? Tremor 01/01/2015  ? ? ?Past Surgical History:  ?Procedure Laterality Date  ? COLONOSCOPY N/A 03/14/2015  ? Procedure: COLONOSCOPY;  Surgeon: West Bali, MD;  Location: [facility];  Service: Endoscopy;  Laterality: N/A;  8:30 AM  ? HERNIA REPAIR  2013  ? KNEE ARTHROSCOPY Right 2002  ? ? ? ? ? ?Home Medications   ? ?Prior to Admission medications   ?Medication Sig Start Date End Date Taking? Authorizing Provider  ?molnupiravir EUA (LAGEVRIO) 200 mg CAPS capsule Take 4 capsules (800 mg total) by mouth 2 (two) times daily for 5 days. 10/31/21 11/05/21 Yes Particia Nearing, PA-C  ?aspirin EC 81 MG tablet Take 1 tablet (81 mg total) by mouth daily. 03/06/15   Pricilla Riffle, MD  ?levocetirizine (XYZAL) 5 MG tablet Take 1 tablet (5 mg total) by mouth every evening. 10/29/21   Wallis Bamberg, PA-C  ?montelukast (SINGULAIR) 10 MG tablet Take 10 mg by mouth daily as needed (allergies).    [provider]  ?OVER THE COUNTER MEDICATION Take 1 packet by mouth daily. "Green Vibrance" vitamin supplement    [provider]  ?predniSONE (DELTASONE) 20 MG tablet Take 2 tablets daily with breakfast.  10/29/21   Wallis Bamberg, PA-C  ?promethazine-dextromethorphan (PROMETHAZINE-DM) 6.25-15 MG/5ML syrup Take 5 mLs by mouth 4 (four) times daily as needed for cough. 10/29/21   Wallis Bamberg, PA-C  ?Saw Palmetto, Serenoa repens, (SAW PALMETTO PO) Take 1 capsule by mouth daily.    [provider]  ? ? ?Family History ?Family History  ?Problem Relation Age of Onset  ? Parkinsonism Neg Hx   ? ? ?Social History ?Social History  ? ?Tobacco Use  ? Smoking status: Never  ? Smokeless tobacco: Never  ?Substance Use Topics  ? Alcohol use: No  ?  Alcohol/week: 0.0 standard drinks  ?  Comment: Quit in 1981  ? Drug use: No  ? ? ? ?Allergies   ?Tequin [gatifloxacin] ? ? ?Review of Systems ?Review of Systems ?Per HPI ? ?Physical Exam ?Triage Vital Signs ?ED Triage Vitals  ?Enc Vitals Group  ?   BP 10/31/21 1233 123/87  ?   Pulse Rate 10/31/21 1233 99  ?   Resp 10/31/21 1233 18  ?   Temp 10/31/21 1233 98 ?F (36.7 ?C)  ?   Temp Source 10/31/21 1233 Oral  ?   SpO2 10/31/21 1233 92 %  ?   Weight 10/31/21 1234 160 lb (72.6 kg)  ?   Height 10/31/21 1234 5' 6.5" (1.689 m)  ?   Head Circumference --   ?   Peak Flow --   ?  Pain Score 10/31/21 1234 0  ?   Pain Loc --   ?   Pain Edu? --   ?   Excl. in GC? --   ? ?No data found. ? ?Updated Vital Signs ?BP 123/87 (BP Location: Right Arm)   Pulse 99   Temp 98 ?F (36.7 ?C) (Oral)   Resp 18   Ht 5' 6.5" (1.689 m)   Wt 160 lb (72.6 kg)   SpO2 92%   BMI 25.44 kg/m?  ? ?Visual Acuity ?Right Eye Distance:   ?Left Eye Distance:   ?Bilateral Distance:   ? ?Right Eye Near:   ?Left Eye Near:    ?Bilateral Near:    ? ?Physical Exam ?Vitals and nursing note reviewed.  ?Constitutional:   ?   Appearance: He is well-developed.  ?HENT:  ?   Head: Atraumatic.  ?   Right Ear: External ear normal.  ?   Left Ear: External ear normal.  ?   Nose: Rhinorrhea present.  ?   Mouth/Throat:  ?   Pharynx: Posterior oropharyngeal erythema present. No oropharyngeal exudate.  ?Eyes:  ?   Conjunctiva/sclera:  Conjunctivae normal.  ?   Pupils: Pupils are equal, round, and reactive to light.  ?Cardiovascular:  ?   Rate and Rhythm: Normal rate and regular rhythm.  ?Pulmonary:  ?   Effort: Pulmonary effort is normal. No respiratory distress.  ?   Breath sounds: No wheezing or rales.  ?Musculoskeletal:     ?   General: Normal range of motion.  ?   Cervical back: Normal range of motion and neck supple.  ?Lymphadenopathy:  ?   Cervical: No cervical adenopathy.  ?Skin: ?   General: Skin is warm and dry.  ?Neurological:  ?   Mental Status: He is alert and oriented to person, place, and time.  ?Psychiatric:     ?   Behavior: Behavior normal.  ? ?UC Treatments / Results  ?Labs ?(all labs ordered are listed, but only abnormal results are displayed) ?Labs Reviewed  ?COVID-19, FLU A+B NAA  ? ? ?EKG ? ? ?Radiology ?No results found. ? ?Procedures ?Procedures (including critical care time) ? ?Medications Ordered in UC ?Medications - No data to display ? ?Initial Impression / Assessment and Plan / UC Course  ?I have reviewed the triage vital signs and the nursing notes. ? ?Pertinent labs & imaging results that were available during my care of the patient were reviewed by me and considered in my medical decision making (see chart for details). ? ?  ? ?Vital signs overall reassuring today, consistent with viral upper respiratory infection.  Likely COVID given symptoms, COVID and flu testing pending, start molnupiravir proactively while awaiting results.  Discussed supportive home care and return precautions.  Work note given. ? ?Final Clinical Impressions(s) / UC Diagnoses  ? ?Final diagnoses:  ?Encounter for screening for COVID-19  ?Viral URI with cough  ?Fever, unspecified  ? ?Discharge Instructions   ?None ?  ? ?ED Prescriptions   ? ? Medication Sig Dispense Auth. Provider  ? molnupiravir EUA (LAGEVRIO) 200 mg CAPS capsule Take 4 capsules (800 mg total) by mouth 2 (two) times daily for 5 days. 40 capsule Particia Nearing, [place]   ? ?  ? ?PDMP not reviewed this encounter. ?  ?Particia Nearing, PA-C ?10/31/21 1355 ? ?

## 2021-11-01 LAB — COVID-19, FLU A+B NAA
Influenza A, NAA: NOT DETECTED
Influenza B, NAA: NOT DETECTED
SARS-CoV-2, NAA: NOT DETECTED

## 2021-11-21 ENCOUNTER — Ambulatory Visit (INDEPENDENT_AMBULATORY_CARE_PROVIDER_SITE_OTHER): Payer: BC Managed Care – PPO

## 2021-11-21 ENCOUNTER — Ambulatory Visit
Admission: EM | Admit: 2021-11-21 | Discharge: 2021-11-21 | Disposition: A | Discharge status: Home / Self Care | Payer: 59 | Attending: Urgent Care | Admitting: Urgent Care

## 2021-11-21 ENCOUNTER — Other Ambulatory Visit: Payer: Self-pay

## 2021-11-21 DIAGNOSIS — R053 Chronic cough: Secondary | ICD-10-CM | POA: Diagnosis not present

## 2021-11-21 DIAGNOSIS — R0989 Other specified symptoms and signs involving the circulatory and respiratory systems: Secondary | ICD-10-CM

## 2021-11-21 MED ORDER — BENZONATATE 100 MG PO CAPS
100.0000 mg | ORAL_CAPSULE | Freq: Three times a day (TID) | ORAL | 0 refills | Status: DC | PRN
Start: 2021-11-21 — End: 2022-08-24

## 2021-11-21 MED ORDER — LEVOCETIRIZINE DIHYDROCHLORIDE 5 MG PO TABS: 5.0000 mg | ORAL_TABLET | Freq: Every evening | ORAL | 0 refills | Status: DC

## 2021-11-21 MED ORDER — PROMETHAZINE-DM 6.25-15 MG/5ML PO SYRP
5.0000 mL | ORAL_SOLUTION | Freq: Four times a day (QID) | ORAL | 0 refills | Status: DC | PRN
Start: 2021-11-21 — End: 2022-08-24

## 2021-11-21 NOTE — ED Triage Notes (Signed)
Pt reports sinus headache since this morning; right side sharp pain on and off , muscle spasm x 2 weeks; congestion in chest x 1 month. Pt reports he was treated for walking pneumonia. Denies chest pain at this moment.  ? ?Pt reports he was feeling dizzy at work, on 11/18/2021.  ?

## 2021-11-21 NOTE — ED Provider Notes (Signed)
?[place] ? ? ?MRN: [id number] DOB: 01-10-1956 ? ?Subjective:  ? ?Juan Schultz is a 66 y.o. male presenting for persistent malaise, fatigue, coughing, sweats, body aches. I first saw patient 10/29/2021, had a negative chest x-ray. Had a steroid course then with supportive care. On 10/31/2021, was tested for COVID 19, was negative. Did not take molnupiravir. Went to his PCP thereafter, underwent a course of doxycycline, IM Rocefin and IM steroid followed by oral prednisone through his doctor's office. Finished this treatment ~1 week ago.  No history of smoking.  No history of respiratory disorders.  Patient is overall healthy.  He is not currently using any medications to help him with his symptoms. ? ?No current facility-administered medications for this encounter. ? ?Current Outpatient Medications:  ?  aspirin EC 81 MG tablet, Take 1 tablet (81 mg total) by mouth daily., Disp: , Rfl:  ?  levocetirizine (XYZAL) 5 MG tablet, Take 1 tablet (5 mg total) by mouth every evening., Disp: 90 tablet, Rfl: 0 ?  montelukast (SINGULAIR) 10 MG tablet, Take 10 mg by mouth daily as needed (allergies)., Disp: , Rfl:  ?  OVER THE COUNTER MEDICATION, Take 1 packet by mouth daily. "Green Vibrance" vitamin supplement, Disp: , Rfl:  ?  predniSONE (DELTASONE) 20 MG tablet, Take 2 tablets daily with breakfast., Disp: 10 tablet, Rfl: 0 ?  promethazine-dextromethorphan (PROMETHAZINE-DM) 6.25-15 MG/5ML syrup, Take 5 mLs by mouth 4 (four) times daily as needed for cough., Disp: 100 mL, Rfl: 0 ?  Saw Palmetto, Serenoa repens, (SAW PALMETTO PO), Take 1 capsule by mouth daily., Disp: , Rfl:   ? ?Allergies  ?Allergen Reactions  ? Tequin [Gatifloxacin]   ?  Mouth got numb  ? ? ?Past Medical History:  ?Diagnosis Date  ? Seasonal allergies   ?  ? ?Past Surgical History:  ?Procedure Laterality Date  ? COLONOSCOPY N/A 03/14/2015  ? Procedure: COLONOSCOPY;  Surgeon: Danie Binder, MD;  [facility]: [facility];  Service:  Endoscopy;  Laterality: N/A;  8:30 AM  ? HERNIA REPAIR  2013  ? KNEE ARTHROSCOPY Right 2002  ? ? ?Family History  ?Problem Relation Age of Onset  ? Parkinsonism Neg Hx   ? ? ?Social History  ? ?Tobacco Use  ? Smoking status: Never  ? Smokeless tobacco: Never  ?Substance Use Topics  ? Alcohol use: No  ?  Alcohol/week: 0.0 standard drinks  ?  Comment: Quit in 1981  ? Drug use: No  ? ? ?ROS ? ? ?Objective:  ? ?Vitals: ?BP 140/88 (BP Location: Right Arm)   Pulse (!) 121   Temp 98.4 ?F (36.9 ?C) (Oral)   SpO2 96%  ? ?Pulse ranged between 115-124bpm. Pulse oximetry ranged from 92%-96%. ? ?Physical Exam ?Constitutional:   ?   General: He is not in acute distress. ?   Appearance: Normal appearance. He is well-developed. He is not ill-appearing, toxic-appearing or diaphoretic.  ?HENT:  ?   Head: Normocephalic and atraumatic.  ?   Right Ear: External ear normal.  ?   Left Ear: External ear normal.  ?   Nose: Nose normal.  ?   Mouth/Throat:  ?   Mouth: Mucous membranes are moist.  ?Eyes:  ?   General: No scleral icterus.    ?   Right eye: No discharge.     ?   Left eye: No discharge.  ?   Extraocular Movements: Extraocular movements intact.  ?Cardiovascular:  ?   Rate and Rhythm: Normal  rate and regular rhythm.  ?   Heart sounds: Normal heart sounds. No murmur heard. ?  No friction rub. No gallop.  ?Pulmonary:  ?   Effort: Pulmonary effort is normal. No respiratory distress.  ?   Breath sounds: Normal breath sounds. No stridor. No wheezing, rhonchi or rales.  ?Neurological:  ?   Mental Status: He is alert and oriented to person, place, and time.  ?Psychiatric:     ?   Mood and Affect: Mood normal.     ?   Behavior: Behavior normal.     ?   Thought Content: Thought content normal.     ?   Judgment: Judgment normal.  ? ?DG Chest 2 View ? ?Result Date: 11/21/2021 ?CLINICAL DATA:  Chest congestion EXAM: CHEST - 2 VIEW COMPARISON:  10/29/2021. FINDINGS: The heart size and mediastinal contours are within normal limits. Both lungs  are clear. The visualized skeletal structures are unremarkable. IMPRESSION: No active cardiopulmonary disease. Electronically Signed   By: Misty Stanley M.D.   On: 11/21/2021 10:49   ? ?ED ECG REPORT ? ? Date: 11/21/2021 ? EKG Time: 11:26 AM ? Rate: 102 ? Rhythm: sinus tachycardia,  unchanged from previous tracings ? Axis: normal ? Intervals:none ? ST&T Change: Nonspecific T wave inversion in lead aVL ? Narrative Interpretation: Borderline sinus tachycardia at 102 bpm with nonspecific T wave change.  No acute findings, changes from previous EKG. ? ? ?Assessment and Plan :  ? ?PDMP not reviewed this encounter. ? ?1. Persistent cough   ?2. Chest congestion   ? ?Patient has an undifferentiated persistent cough.  He is taking 2 antibiotics, 3 rounds of steroids.  He has low risk factors for an acute cardiopulmonary event including pulmonary embolism and the patient does not have any overt shortness of breath.  Chest x-ray was negative again today.  EKG unremarkable with borderline tachycardia.  Recommended continued supportive care, follow-up closely with his PCP.  Maintain strict ER precautions. Counseled patient on potential for adverse effects with medications prescribed/recommended today, ER and return-to-clinic precautions discussed, patient verbalized understanding. ? ?  ?Jaynee Eagles, PA-C ?11/21/21 1127 ? ?

## 2021-11-22 ENCOUNTER — Ambulatory Visit (HOSPITAL_COMMUNITY): Payer: Self-pay

## 2021-11-22 ENCOUNTER — Other Ambulatory Visit: Payer: Self-pay

## 2021-11-22 ENCOUNTER — Emergency Department (HOSPITAL_COMMUNITY): Payer: 59

## 2021-11-22 ENCOUNTER — Emergency Department (HOSPITAL_COMMUNITY)
Admission: EM | Admit: 2021-11-22 | Discharge: 2021-11-22 | Disposition: A | Discharge status: Home / Self Care | Payer: 59 | Attending: Emergency Medicine | Admitting: Emergency Medicine

## 2021-11-22 ENCOUNTER — Encounter (HOSPITAL_COMMUNITY): Payer: Self-pay | Admitting: Emergency Medicine

## 2021-11-22 DIAGNOSIS — Z20822 Contact with and (suspected) exposure to covid-19: Secondary | ICD-10-CM | POA: Insufficient documentation

## 2021-11-22 DIAGNOSIS — R000 Tachycardia, unspecified: Secondary | ICD-10-CM | POA: Diagnosis not present

## 2021-11-22 DIAGNOSIS — R7401 Elevation of levels of liver transaminase levels: Secondary | ICD-10-CM | POA: Insufficient documentation

## 2021-11-22 DIAGNOSIS — J45909 Unspecified asthma, uncomplicated: Secondary | ICD-10-CM | POA: Insufficient documentation

## 2021-11-22 DIAGNOSIS — R42 Dizziness and giddiness: Secondary | ICD-10-CM | POA: Insufficient documentation

## 2021-11-22 DIAGNOSIS — R079 Chest pain, unspecified: Secondary | ICD-10-CM | POA: Diagnosis not present

## 2021-11-22 DIAGNOSIS — R0602 Shortness of breath: Secondary | ICD-10-CM | POA: Diagnosis not present

## 2021-11-22 DIAGNOSIS — R059 Cough, unspecified: Secondary | ICD-10-CM | POA: Diagnosis not present

## 2021-11-22 DIAGNOSIS — Z7982 Long term (current) use of aspirin: Secondary | ICD-10-CM | POA: Diagnosis not present

## 2021-11-22 DIAGNOSIS — E876 Hypokalemia: Secondary | ICD-10-CM | POA: Diagnosis not present

## 2021-11-22 DIAGNOSIS — Z7952 Long term (current) use of systemic steroids: Secondary | ICD-10-CM | POA: Diagnosis not present

## 2021-11-22 DIAGNOSIS — R052 Subacute cough: Secondary | ICD-10-CM | POA: Insufficient documentation

## 2021-11-22 DIAGNOSIS — I2510 Atherosclerotic heart disease of native coronary artery without angina pectoris: Secondary | ICD-10-CM | POA: Diagnosis not present

## 2021-11-22 LAB — COMPREHENSIVE METABOLIC PANEL
ALT: 46 U/L — ABNORMAL HIGH (ref 0–44)
AST: 26 U/L (ref 15–41)
Albumin: 3.8 g/dL (ref 3.5–5.0)
Alkaline Phosphatase: 58 U/L (ref 38–126)
Anion gap: 9 (ref 5–15)
BUN: 13 mg/dL (ref 8–23)
CO2: 26 mmol/L (ref 22–32)
Calcium: 9 mg/dL (ref 8.9–10.3)
Chloride: 104 mmol/L (ref 98–111)
Creatinine, Ser: 1.01 mg/dL (ref 0.61–1.24)
GFR, Estimated: >60 mL/min (ref >60)
Glucose, Bld: 146 mg/dL — ABNORMAL HIGH (ref 70–99)
Potassium: 3.4 mmol/L — ABNORMAL LOW (ref 3.5–5.1)
Sodium: 139 mmol/L (ref 135–145)
Total Bilirubin: 0.4 mg/dL (ref 0.3–1.2)
Total Protein: 6.6 g/dL (ref 6.5–8.1)

## 2021-11-22 LAB — CBC WITH DIFFERENTIAL/PLATELET
Abs Immature Granulocytes: 0.02 10*3/uL (ref 0.00–0.07)
Basophils Absolute: 0.1 10*3/uL (ref 0.0–0.1)
Basophils Relative: 2 %
Eosinophils Absolute: 0.3 10*3/uL (ref 0.0–0.5)
Eosinophils Relative: 5 %
HCT: 41.1 % (ref 39.0–52.0)
Hemoglobin: 13.7 g/dL (ref 13.0–17.0)
Immature Granulocytes: 0 %
Lymphocytes Relative: 20 %
Lymphs Abs: 1.1 10*3/uL (ref 0.7–4.0)
MCH: 30.2 pg (ref 26.0–34.0)
MCHC: 33.3 g/dL (ref 30.0–36.0)
MCV: 90.5 fL (ref 80.0–100.0)
Monocytes Absolute: 0.5 10*3/uL (ref 0.1–1.0)
Monocytes Relative: 8 %
Neutro Abs: 3.6 10*3/uL (ref 1.7–7.7)
Neutrophils Relative %: 65 %
Platelets: 232 10*3/uL (ref 150–400)
RBC: 4.54 MIL/uL (ref 4.22–5.81)
RDW: 12.4 % (ref 11.5–15.5)
WBC: 5.5 10*3/uL (ref 4.0–10.5)
nRBC: 0 % (ref 0.0–0.2)

## 2021-11-22 LAB — RESP PANEL BY RT-PCR (FLU A&B, COVID) ARPGX2
Influenza A by PCR: NEGATIVE
Influenza B by PCR: NEGATIVE
SARS Coronavirus 2 by RT PCR: NEGATIVE

## 2021-11-22 LAB — TROPONIN I (HIGH SENSITIVITY)
Troponin I (High Sensitivity): <2 ng/L (ref <18)
Troponin I (High Sensitivity): <2 ng/L (ref <18)

## 2021-11-22 LAB — URINALYSIS, ROUTINE W REFLEX MICROSCOPIC
Bilirubin Urine: NEGATIVE
Glucose, UA: NEGATIVE mg/dL
Hgb urine dipstick: NEGATIVE
Ketones, ur: NEGATIVE mg/dL
Leukocytes,Ua: NEGATIVE
Nitrite: NEGATIVE
Protein, ur: NEGATIVE mg/dL
Specific Gravity, Urine: 1.031 — ABNORMAL HIGH (ref 1.005–1.030)
pH: 7 (ref 5.0–8.0)

## 2021-11-22 LAB — LACTIC ACID, PLASMA: Lactic Acid, Venous: 1.9 mmol/L (ref 0.5–1.9)

## 2021-11-22 MED ORDER — IOHEXOL 350 MG/ML SOLN
75.0000 mL | Freq: Once | INTRAVENOUS | Status: AC | PRN
  Administered 2021-11-22: 75 mL via INTRAVENOUS

## 2021-11-22 MED ORDER — LACTATED RINGERS IV BOLUS
500.0000 mL | Freq: Once | INTRAVENOUS | Status: AC
  Administered 2021-11-22: 500 mL via INTRAVENOUS

## 2021-11-22 NOTE — Discharge Instructions (Addendum)
Please make sure you are drinking plenty water and staying well-hydrated.  Your urine today showed that you do appear to be slightly dehydrated. ?Please take Allegra, Claritin, Zyrtec, or Xyzal every day.  I recommend taking these at night so if you do get a little bit drowsy it does not affect you. ?Additionally I would recommend that you start using Flonase or Nasacort.  Nasacort I think tends to work a little bit better however either 1 should help.  I will take a few days for you to see any effects from this. ? ?All of your blood work today was very reassuring.  Your CT scan did not show a cause for your symptoms today. ?Please call your primary care doctor, tell them that you were in the emergency room and that you need close outpatient follow-up. ?Please allow your body time when you change positions going from sitting to standing or from laying to sitting as your body may need extra time to adjust given that you have been sick recently. ? ?You develop blood cultures pending.  You will only receive a call if these are positive and you require treatment.  This is to ensure that you do not have a hidden infection ? ?

## 2021-11-22 NOTE — ED Provider Notes (Signed)
?[facility] ?Provider Note ? ? ?CSN: [id number] ?Arrival date & time: 11/22/21  1346 ? ?  ? ?History ? ?Chief Complaint  ?Patient presents with  ? Dizziness  ? ? ?Juan Schultz is a 66 y.o. male with past medical history of allergic rhinitis and asthma who presents today for concern of about 1 month of cough, lightheadedness/dizziness, fatigue, night sweats/chills and debility. ?He states that he has been treated with multiple rounds of medications including a prednisone burst, shot of IM Rocephin, 10 days of doxycycline, Xyzal, all without resolution of his symptoms.  He has been seen multiple times for this at urgent care and by his PCP per his report. ?He has been tested negative for COVID and flu twice.  He has had 2 chest x-rays including one yesterday from urgent care without consolidation pneumothorax or other cause for symptoms found. ? ?Patient's wife provides additional history, reports that he has been having ongoing hot flashes and cold chills, fatigue, and significant lightheadedness or dizziness anytime he stands.  He reports no nausea vomiting or diarrhea.  He reports he has good p.o. intake. ? ?HPI ? ?  ? ?Home Medications ?Prior to Admission medications   ?Medication Sig Start Date End Date Taking? Authorizing Provider  ?aspirin EC 81 MG tablet Take 1 tablet (81 mg total) by mouth daily. 03/06/15   Fay Records, MD  ?benzonatate (TESSALON) 100 MG capsule Take 1-2 capsules (100-200 mg total) by mouth 3 (three) times daily as needed for cough. 11/21/21   Jaynee Eagles, PA-C  ?levocetirizine (XYZAL) 5 MG tablet Take 1 tablet (5 mg total) by mouth every evening. 11/21/21   Jaynee Eagles, PA-C  ?montelukast (SINGULAIR) 10 MG tablet Take 10 mg by mouth daily as needed (allergies).    [provider]  ?OVER THE COUNTER MEDICATION Take 1 packet by mouth daily. "Green Vibrance" vitamin supplement    [provider]  ?predniSONE (DELTASONE) 20 MG tablet Take 2 tablets daily with  breakfast. 10/29/21   Jaynee Eagles, PA-C  ?promethazine-dextromethorphan (PROMETHAZINE-DM) 6.25-15 MG/5ML syrup Take 5 mLs by mouth 4 (four) times daily as needed for cough. 11/21/21   Jaynee Eagles, PA-C  ?Saw Palmetto, Serenoa repens, (SAW PALMETTO PO) Take 1 capsule by mouth daily.    [provider]  ?   ? ?Allergies    ?Tequin [gatifloxacin]   ? ?Review of Systems   ?Review of Systems ? ?Physical Exam ?Updated Vital Signs ?BP 126/79   Pulse 86   Temp 98 ?F (36.7 ?C)   Resp 20   Ht 5' 6.5" (1.689 m)   Wt 72.6 kg   SpO2 97%   BMI 25.44 kg/m?  ?Physical Exam ?Vitals and nursing note reviewed.  ?Constitutional:   ?   General: He is not in acute distress. ?   Appearance: He is not diaphoretic.  ?HENT:  ?   Head: Normocephalic and atraumatic.  ?   Nose: Nose normal. No congestion.  ?Eyes:  ?   General: No scleral icterus.    ?   Right eye: No discharge.     ?   Left eye: No discharge.  ?   Conjunctiva/sclera: Conjunctivae normal.  ?Cardiovascular:  ?   Rate and Rhythm: Regular rhythm. Tachycardia present.  ?   Pulses: Normal pulses.  ?   Heart sounds: Normal heart sounds.  ?Pulmonary:  ?   Effort: Pulmonary effort is normal. No respiratory distress.  ?   Breath sounds: Normal breath sounds.  No stridor. No wheezing or rhonchi.  ?Abdominal:  ?   General: There is no distension.  ?   Palpations: Abdomen is soft.  ?   Tenderness: There is no abdominal tenderness. There is no guarding or rebound.  ?Musculoskeletal:     ?   General: No deformity.  ?   Cervical back: Normal range of motion and neck supple.  ?   Right lower leg: No edema.  ?   Left lower leg: No edema.  ?Skin: ?   General: Skin is warm and dry.  ?Neurological:  ?   Mental Status: He is alert.  ?   Motor: No abnormal muscle tone.  ?   Comments: Patient is awake and alert, speech is not slurred.  Answers questions appropriately without difficulty.  ?Psychiatric:     ?   Behavior: Behavior normal.  ? ? ?ED Results / Procedures / Treatments    ?Labs ?(all labs ordered are listed, but only abnormal results are displayed) ?Labs Reviewed  ?COMPREHENSIVE METABOLIC PANEL - Abnormal; Notable for the following components:  ?    Result Value  ? Potassium 3.4 (*)   ? Glucose, Bld 146 (*)   ? ALT 46 (*)   ? All other components within normal limits  ?URINALYSIS, ROUTINE W REFLEX MICROSCOPIC - Abnormal; Notable for the following components:  ? Specific Gravity, Urine 1.031 (*)   ? All other components within normal limits  ?RESP PANEL BY RT-PCR (FLU A&B, COVID) [id number]  ?CULTURE, BLOOD (ROUTINE X 2)  ?CULTURE, BLOOD (ROUTINE X 2)  ?CBC WITH DIFFERENTIAL/PLATELET  ?LACTIC ACID, PLASMA  ?TROPONIN I (HIGH SENSITIVITY)  ?TROPONIN I (HIGH SENSITIVITY)  ? ? ?EKG ?EKG Interpretation ? ?Date/Time:  Sunday November 22 2021 14:08:54 EDT ?Ventricular Rate:  105 ?PR Interval:  154 ?QRS Duration: 90 ?QT Interval:  339 ?QTC Calculation: 448 ?R Axis:   43 ?Text Interpretation: Sinus tachycardia Abnormal ECG Confirmed by Carmin Muskrat [id number]) on 11/22/2021 3:09:43 PM ? ?Radiology ?DG Chest 2 View ? ?Result Date: 11/21/2021 ?CLINICAL DATA:  Chest congestion EXAM: CHEST - 2 VIEW COMPARISON:  10/29/2021. FINDINGS: The heart size and mediastinal contours are within normal limits. Both lungs are clear. The visualized skeletal structures are unremarkable. IMPRESSION: No active cardiopulmonary disease. Electronically Signed   By: Misty Stanley M.D.   On: 11/21/2021 10:49  ? ?CT Angio Chest PE W and/or Wo Contrast ? ?Result Date: 11/22/2021 ?CLINICAL DATA:  66 year old male with shortness of breath and chest pain. History of recent pneumonia. EXAM: CT ANGIOGRAPHY CHEST WITH CONTRAST TECHNIQUE: Multidetector CT imaging of the chest was performed using the standard protocol during bolus administration of intravenous contrast. Multiplanar CT image reconstructions and MIPs were obtained to evaluate the vascular anatomy. RADIATION DOSE REDUCTION: This exam was performed according to the departmental  dose-optimization program which includes automated exposure control, adjustment of the mA and/or kV according to patient size and/or use of iterative reconstruction technique. CONTRAST:  70m OMNIPAQUE IOHEXOL 350 MG/ML SOLN COMPARISON:  12/03/2019 CT and prior studies FINDINGS: Cardiovascular: This is a technically satisfactory study but motion artifact in the LOWER lungs slightly limits sensitivity. No pulmonary emboli are identified. Heart size is normal. LAD coronary artery atherosclerotic calcifications are noted. No thoracic aortic aneurysm or pericardial effusion identified. Mediastinum/Nodes: No enlarged mediastinal, hilar, or axillary lymph nodes. Thyroid gland, trachea, and esophagus demonstrate no significant findings. Lungs/Pleura: Mild bibasilar atelectasis/scarring is unchanged. There is no evidence of airspace disease, consolidation, mass, suspicious nodule, pleural effusion or  pneumothorax. Upper Abdomen: No acute abnormality. Probable mild hepatic steatosis noted. Musculoskeletal: No acute or suspicious bony abnormalities are noted. Review of the MIP images confirms the above findings. IMPRESSION: 1. No evidence of acute abnormality. No evidence of pulmonary emboli. 2. Coronary artery disease. 3. Probable mild hepatic steatosis. Electronically Signed   By: Margarette Canada M.D.   On: 11/22/2021 16:47   ? ?Procedures ?Procedures  ? ? ?Medications Ordered in ED ?Medications  ?lactated ringers bolus 500 mL (0 mLs Intravenous Stopped 11/22/21 1812)  ?iohexol (OMNIPAQUE) 350 MG/ML injection 75 mL (75 mLs Intravenous Contrast Given 11/22/21 1612)  ? ? ?ED Course/ Medical Decision Making/ A&P ?  ?                        ?Medical Decision Making ?Patient is a 66 year old man who presents today for evaluation of multiple weeks of cough, fatigue, difficulty tolerating activities without getting lightheaded. ? ?I reviewed chest x-ray that was obtained yesterday without acute abnormalities. ? ?Given patient has had  ongoing myriad of symptoms without significant improvement additional evaluation is indicated. ? ?Please see below for further information about labs and imaging that was ordered.  Incidental findings were discussed with p

## 2021-11-22 NOTE — ED Notes (Signed)
Patient transported to CT 

## 2021-11-22 NOTE — ED Triage Notes (Signed)
Pt reports diagnosis of pneumonia x 4-5 weeks ago; pt reports completed abx; since has had episodes of dizziness, shakes, sweating, chest congestion, chills; reports was seen by UC  ?

## 2021-11-25 DIAGNOSIS — Z6825 Body mass index (BMI) 25.0-25.9, adult: Secondary | ICD-10-CM | POA: Diagnosis not present

## 2021-11-25 DIAGNOSIS — E663 Overweight: Secondary | ICD-10-CM | POA: Diagnosis not present

## 2021-11-25 DIAGNOSIS — E876 Hypokalemia: Secondary | ICD-10-CM | POA: Diagnosis not present

## 2021-11-25 DIAGNOSIS — R5383 Other fatigue: Secondary | ICD-10-CM | POA: Diagnosis not present

## 2021-11-25 DIAGNOSIS — J189 Pneumonia, unspecified organism: Secondary | ICD-10-CM | POA: Diagnosis not present

## 2021-11-27 LAB — CULTURE, BLOOD (ROUTINE X 2)
Culture: NO GROWTH
Culture: NO GROWTH

## 2022-03-08 DIAGNOSIS — M1711 Unilateral primary osteoarthritis, right knee: Secondary | ICD-10-CM | POA: Insufficient documentation

## 2022-03-08 DIAGNOSIS — M25561 Pain in right knee: Secondary | ICD-10-CM | POA: Insufficient documentation

## 2022-06-01 ENCOUNTER — Encounter: Payer: Self-pay | Admitting: Emergency Medicine

## 2022-06-01 ENCOUNTER — Ambulatory Visit
Admission: EM | Admit: 2022-06-01 | Discharge: 2022-06-01 | Disposition: A | Discharge status: Home / Self Care | Payer: 59

## 2022-06-01 DIAGNOSIS — U071 COVID-19: Secondary | ICD-10-CM | POA: Diagnosis not present

## 2022-06-01 NOTE — ED Provider Notes (Signed)
[facility]    CSN: [id number] Arrival date & time: 06/01/22  1606      History   Chief Complaint No chief complaint on file.   HPI QUITMAN NORBERTO is a 66 y.o. male.   The history is provided by the patient.   Patient presents for complaints of chest congestion, nasal congestion, bilateral ear fullness, and head congestion that started approximately 1 week ago.  Patient was in [place] at the time his symptoms started.  He did go to a local urgent care and was given a Z-Pak, fluticasone, and prednisone and diagnosed with bronchitis.  Patient states while he was there, his symptoms fail to improve and he followed up at the same urgent care 3 days later and was given an injection of Rocephin.  He also states at that time, he was tested for COVID-19 which came back positive via PCR on 05/28/2022.  Patient continues to experience a productive cough with yellow sputum.  Patient presents today concerned that the test was not accurate as no one else around him has tested positive.  Patient states that he did return to [place] on a plane while he had symptoms, but reports that he did wear his mask.  Patient has been taking over-the-counter cough and cold medications for his symptoms as well.  Patient states every year he gets the same or similar symptoms and is confused as to why this time it may be COVID.  Past Medical History:  Diagnosis Date   Seasonal allergies     Patient Active Problem List   Diagnosis Date Noted   Special screening for malignant neoplasms, colon    Tremor 01/01/2015    Past Surgical History:  Procedure Laterality Date   COLONOSCOPY N/A 03/14/2015   Procedure: COLONOSCOPY;  Surgeon: West Bali, MD;  Location: [facility];  Service: Endoscopy;  Laterality: N/A;  8:30 AM   HERNIA REPAIR  2013   KNEE ARTHROSCOPY Right 2002       Home Medications    Prior to Admission medications   Medication Sig Start Date End Date Taking?  Authorizing Provider  aspirin EC 81 MG tablet Take 1 tablet (81 mg total) by mouth daily. 03/06/15   Pricilla Riffle, MD  benzonatate (TESSALON) 100 MG capsule Take 1-2 capsules (100-200 mg total) by mouth 3 (three) times daily as needed for cough. 11/21/21   Wallis Bamberg, PA-C  levocetirizine (XYZAL) 5 MG tablet Take 1 tablet (5 mg total) by mouth every evening. 11/21/21   Wallis Bamberg, PA-C  montelukast (SINGULAIR) 10 MG tablet Take 10 mg by mouth daily as needed (allergies).    [provider]  OVER THE COUNTER MEDICATION Take 1 packet by mouth daily. "Green Vibrance" vitamin supplement    [provider]  predniSONE (DELTASONE) 20 MG tablet Take 2 tablets daily with breakfast. 10/29/21   Wallis Bamberg, PA-C  promethazine-dextromethorphan (PROMETHAZINE-DM) 6.25-15 MG/5ML syrup Take 5 mLs by mouth 4 (four) times daily as needed for cough. 11/21/21   Wallis Bamberg, PA-C  Saw Palmetto, Serenoa repens, (SAW PALMETTO PO) Take 1 capsule by mouth daily.    [provider]    Family History Family History  Problem Relation Age of Onset   Parkinsonism Neg Hx     Social History Social History   Tobacco Use   Smoking status: Never   Smokeless tobacco: Never  Substance Use Topics   Alcohol use: No    Alcohol/week: 0.0 standard drinks  of alcohol    Comment: Quit in 1981   Drug use: No     Allergies   Tequin [gatifloxacin]   Review of Systems Review of Systems Per HPI  Physical Exam Triage Vital Signs ED Triage Vitals  Enc Vitals Group     BP 06/01/22 1613 (!) 150/92     Pulse Rate 06/01/22 1613 (!) 113     Resp 06/01/22 1613 16     Temp 06/01/22 1613 (!) 97.5 F (36.4 C)     Temp Source 06/01/22 1613 Oral     SpO2 06/01/22 1613 94 %     Weight --      Height --      Head Circumference --      Peak Flow --      Pain Score 06/01/22 1616 1     Pain Loc --      Pain Edu? --      Excl. in [place]? --    No data found.  Updated Vital Signs BP (!) 150/92 (BP  Location: Right Arm)   Pulse (!) 113   Temp (!) 97.5 F (36.4 C) (Oral)   Resp 16   SpO2 94%   Visual Acuity Right Eye Distance:   Left Eye Distance:   Bilateral Distance:    Right Eye Near:   Left Eye Near:    Bilateral Near:     Physical Exam Vitals and nursing note reviewed.  Constitutional:      General: He is not in acute distress.    Appearance: Normal appearance.  HENT:     Head: Normocephalic.     Right Ear: Tympanic membrane, ear canal and external ear normal. There is impacted cerumen.     Left Ear: Tympanic membrane and external ear normal.     Nose: Congestion present.     Right Turbinates: Enlarged and swollen.     Left Turbinates: Enlarged and swollen.     Right Sinus: No maxillary sinus tenderness or frontal sinus tenderness.     Left Sinus: No maxillary sinus tenderness or frontal sinus tenderness.     Mouth/Throat:     Lips: Pink.     Mouth: Mucous membranes are moist.     Pharynx: Uvula midline. Posterior oropharyngeal erythema present. No pharyngeal swelling, oropharyngeal exudate or uvula swelling.     Tonsils: No tonsillar exudate.  Eyes:     Extraocular Movements: Extraocular movements intact.     Conjunctiva/sclera: Conjunctivae normal.     Pupils: Pupils are equal, round, and reactive to light.  Cardiovascular:     Rate and Rhythm: Normal rate and regular rhythm.     Pulses: Normal pulses.     Heart sounds: Normal heart sounds.  Pulmonary:     Effort: Pulmonary effort is normal. No respiratory distress.     Breath sounds: Normal breath sounds. No stridor. No wheezing, rhonchi or rales.  Abdominal:     General: Bowel sounds are normal.     Palpations: Abdomen is soft.     Tenderness: There is no abdominal tenderness.  Musculoskeletal:     Cervical back: Normal range of motion.  Lymphadenopathy:     Cervical: No cervical adenopathy.  Skin:    General: Skin is warm and dry.  Neurological:     General: No focal deficit present.     Mental  Status: He is alert and oriented to person, place, and time.  Psychiatric:        Mood and Affect:  Mood normal.      UC Treatments / Results  Labs (all labs ordered are listed, but only abnormal results are displayed) Labs Reviewed - No data to display  EKG   Radiology No results found.  Procedures Procedures (including critical care time)  Medications Ordered in UC Medications - No data to display  Initial Impression / Assessment and Plan / UC Course  I have reviewed the triage vital signs and the nursing notes.  Pertinent labs & imaging results that were available during my care of the patient were reviewed by me and considered in my medical decision making (see chart for details).  Patient presents for head congestion, ear fullness, and cough that been present for the past week.  Patient tested positive for COVID-19 on 05/28/2022 via PCR.  Patient is concerned that he has COVID as none of the other people that were on his trip tested positive.  Discussion with patient regarding PCR testing and its accuracy.  Patient advised that even though he feels the same as he normally does every year, his symptoms are caused by the COVID-19 virus.  Patient would like to continue the medications he is currently taking at home.  Patient was given strict indications of when to go to the emergency department versus when to follow-up with his primary care physician.  Patient was given a work note for the next 3 days.  Also discussed supportive care recommendations with the patient.  Patient verbalizes understanding.  All questions were answered. Final Clinical Impressions(s) / UC Diagnoses   Final diagnoses:  COVID-19     Discharge Instructions      Continue the medicines you are currently taking.  You are past the window to start Paxlovid. Increase fluids and allow for plenty of rest. Remain isolated while symptoms persist. Go to the emergency department immediately if you develop  shortness of breath, difficulty breathing, trouble breathing, or other concerns. Remain isolated while symptoms persist.  If after 10 days you have to go out in public and you are continuing to experience symptoms, please wear your mask.  Follow-up with your primary care physician if symptoms do not improve or if you need a note for work beyond the one provided today. Follow-up as needed.     ED Prescriptions   None    PDMP not reviewed this encounter.   Abran Cantor, NP 06/01/22 1701

## 2022-06-01 NOTE — ED Triage Notes (Signed)
Chest congestion since Tuesday last week.  Was seen and give a z-pack and nasal spray and prednisone and diagnosed with bronchitis.  Was given a shot of Rocephin on Friday.  States head and ears are congested.  Productive cough with yellow sputum.   Was tested on Friday for covid and states it came back positive.

## 2022-06-01 NOTE — Discharge Instructions (Signed)
Continue the medicines you are currently taking.  You are past the window to start Paxlovid. Increase fluids and allow for plenty of rest. Remain isolated while symptoms persist. Go to the emergency department immediately if you develop shortness of breath, difficulty breathing, trouble breathing, or other concerns. Remain isolated while symptoms persist.  If after 10 days you have to go out in public and you are continuing to experience symptoms, please wear your mask.  Follow-up with your primary care physician if symptoms do not improve or if you need a note for work beyond the one provided today. Follow-up as needed.

## 2022-06-07 DIAGNOSIS — M25561 Pain in right knee: Secondary | ICD-10-CM | POA: Diagnosis not present

## 2022-06-07 DIAGNOSIS — M9902 Segmental and somatic dysfunction of thoracic region: Secondary | ICD-10-CM | POA: Diagnosis not present

## 2022-06-07 DIAGNOSIS — M546 Pain in thoracic spine: Secondary | ICD-10-CM | POA: Diagnosis not present

## 2022-06-07 DIAGNOSIS — M9903 Segmental and somatic dysfunction of lumbar region: Secondary | ICD-10-CM | POA: Diagnosis not present

## 2022-06-07 DIAGNOSIS — M5441 Lumbago with sciatica, right side: Secondary | ICD-10-CM | POA: Diagnosis not present

## 2022-06-07 DIAGNOSIS — M9905 Segmental and somatic dysfunction of pelvic region: Secondary | ICD-10-CM | POA: Diagnosis not present

## 2022-06-07 DIAGNOSIS — M9906 Segmental and somatic dysfunction of lower extremity: Secondary | ICD-10-CM | POA: Diagnosis not present

## 2022-07-05 DIAGNOSIS — M9902 Segmental and somatic dysfunction of thoracic region: Secondary | ICD-10-CM | POA: Diagnosis not present

## 2022-07-05 DIAGNOSIS — M25561 Pain in right knee: Secondary | ICD-10-CM | POA: Diagnosis not present

## 2022-07-05 DIAGNOSIS — M9906 Segmental and somatic dysfunction of lower extremity: Secondary | ICD-10-CM | POA: Diagnosis not present

## 2022-07-05 DIAGNOSIS — M546 Pain in thoracic spine: Secondary | ICD-10-CM | POA: Diagnosis not present

## 2022-07-05 DIAGNOSIS — M5441 Lumbago with sciatica, right side: Secondary | ICD-10-CM | POA: Diagnosis not present

## 2022-07-05 DIAGNOSIS — M9905 Segmental and somatic dysfunction of pelvic region: Secondary | ICD-10-CM | POA: Diagnosis not present

## 2022-07-05 DIAGNOSIS — M9903 Segmental and somatic dysfunction of lumbar region: Secondary | ICD-10-CM | POA: Diagnosis not present

## 2022-08-02 DIAGNOSIS — M9905 Segmental and somatic dysfunction of pelvic region: Secondary | ICD-10-CM | POA: Diagnosis not present

## 2022-08-02 DIAGNOSIS — M5441 Lumbago with sciatica, right side: Secondary | ICD-10-CM | POA: Diagnosis not present

## 2022-08-02 DIAGNOSIS — M9903 Segmental and somatic dysfunction of lumbar region: Secondary | ICD-10-CM | POA: Diagnosis not present

## 2022-08-02 DIAGNOSIS — M546 Pain in thoracic spine: Secondary | ICD-10-CM | POA: Diagnosis not present

## 2022-08-02 DIAGNOSIS — M9906 Segmental and somatic dysfunction of lower extremity: Secondary | ICD-10-CM | POA: Diagnosis not present

## 2022-08-02 DIAGNOSIS — M25561 Pain in right knee: Secondary | ICD-10-CM | POA: Diagnosis not present

## 2022-08-02 DIAGNOSIS — M9902 Segmental and somatic dysfunction of thoracic region: Secondary | ICD-10-CM | POA: Diagnosis not present

## 2022-08-07 IMAGING — DX DG CHEST 2V
2 series · 2 of 2 positions shown · non-contrast
Comparison: 10/29/2021.

CLINICAL DATA: Chest congestion

EXAM:
CHEST - 2 VIEW

[chest pa]
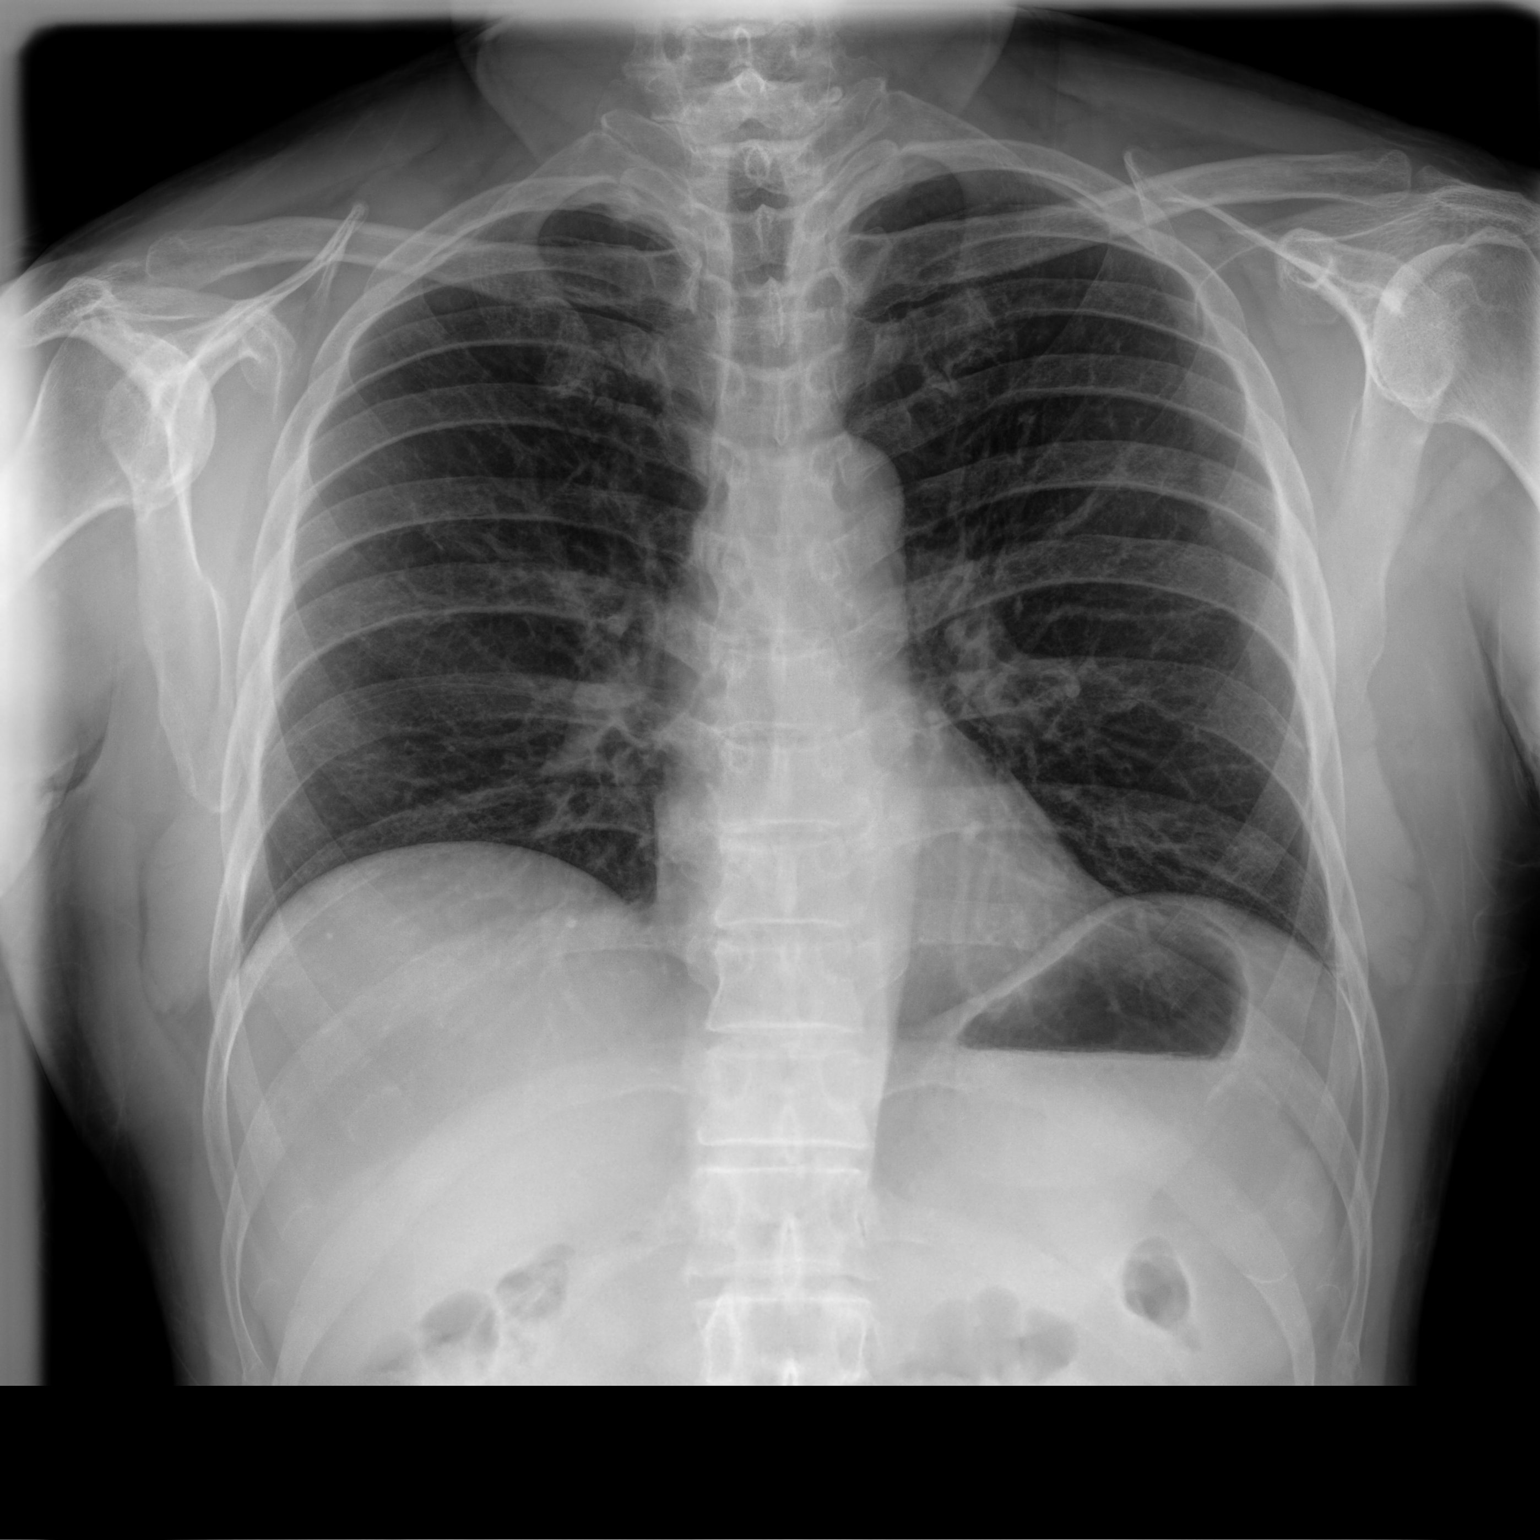

[chest lat]
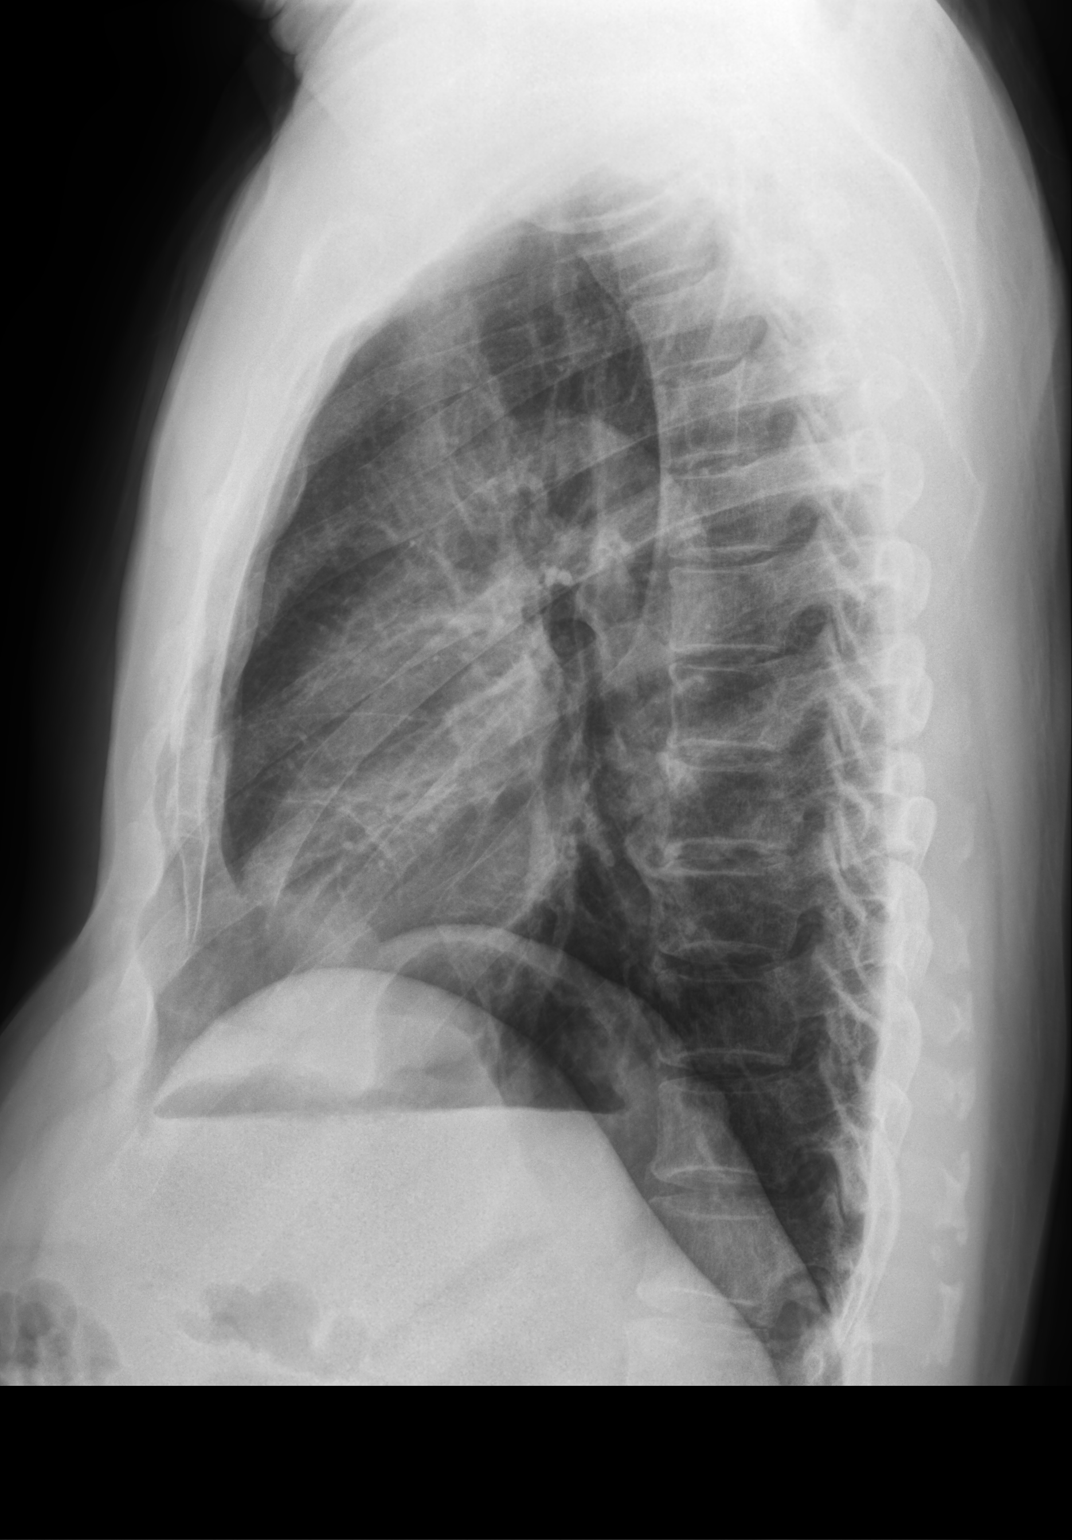

[2 of 2 positions shown; findings below may reference images not displayed]

FINDINGS: The heart size and mediastinal contours are within normal limits.
Both lungs are clear. The visualized skeletal structures are
unremarkable.
IMPRESSION: No active cardiopulmonary disease.

## 2022-08-08 IMAGING — CT CT ANGIO CHEST
2 of 7 series · 18 of 46 positions shown · IV contrast (Omnipaque or Isovue)
Comparison: 12/03/2019 CT and prior studies

CLINICAL DATA: 65-year-old male with shortness of breath and chest
pain. History of recent pneumonia.

EXAM:
CT ANGIOGRAPHY CHEST WITH CONTRAST
TECHNIQUE: Multidetector CT imaging of the chest was performed using the
standard protocol during bolus administration of intravenous
contrast. Multiplanar CT image reconstructions and MIPs were
obtained to evaluate the vascular anatomy.

[Series 5: pe axial thins · axial · 0.71mm/px · z∈[+1095,+1381]mm · 15 of 403 slices shown]
[im 23/403  lung]
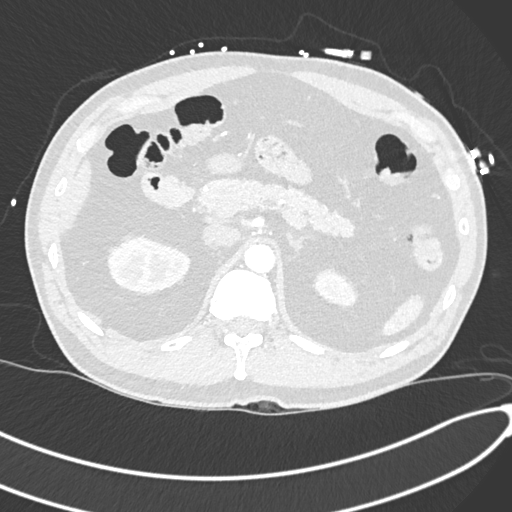
[im 45/403  soft-tissue]
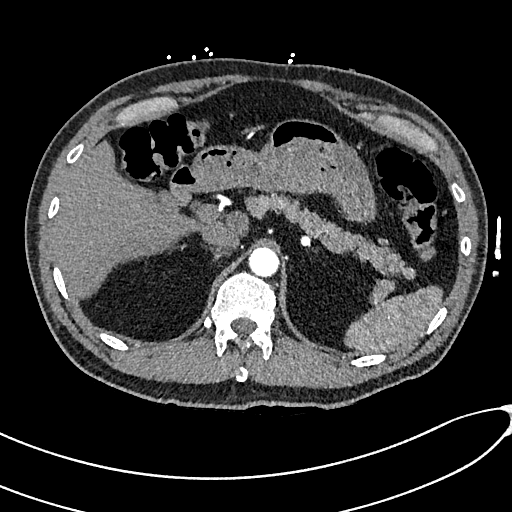
[im 68/403  lung]
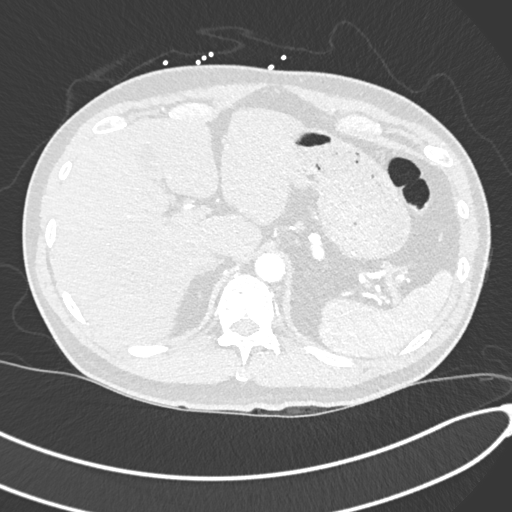
[im 90/403  soft-tissue]
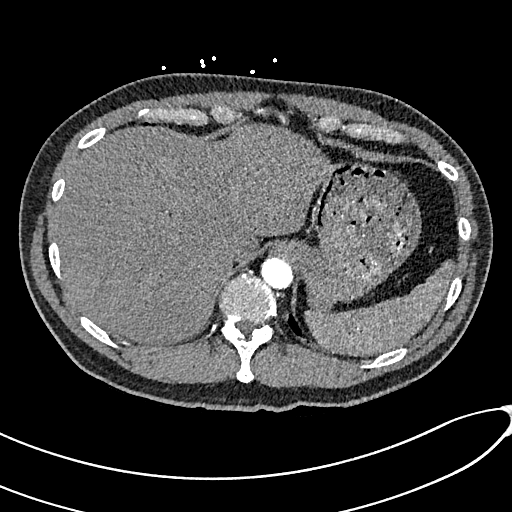
[im 135/403  lung]
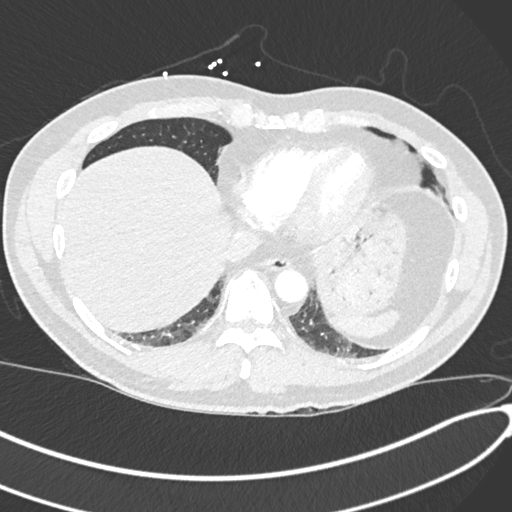
[im 157/403  soft-tissue]
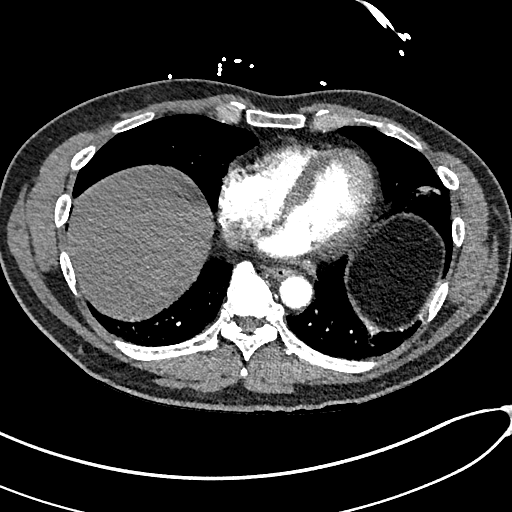
[im 179/403  lung]
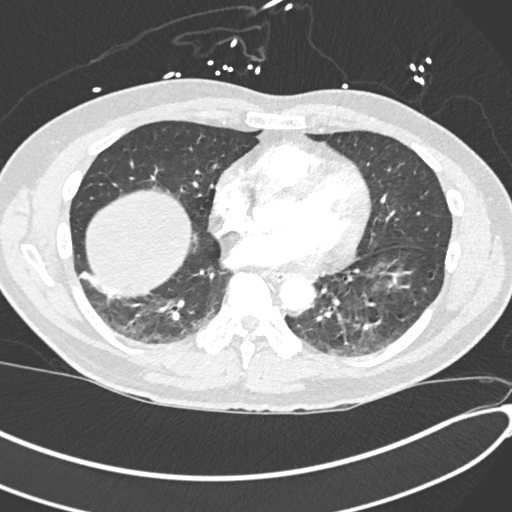
[im 202/403  soft-tissue]
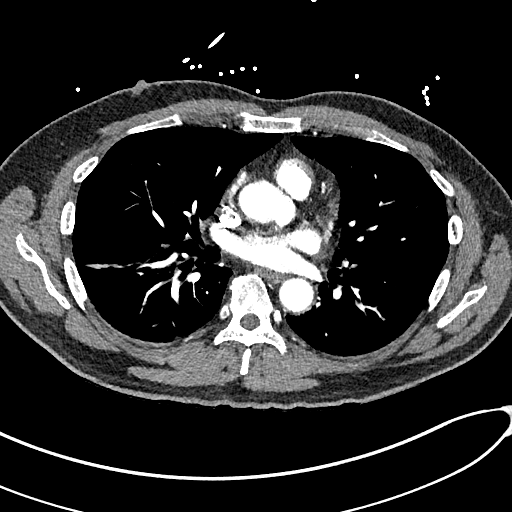
[im 224/403  lung]
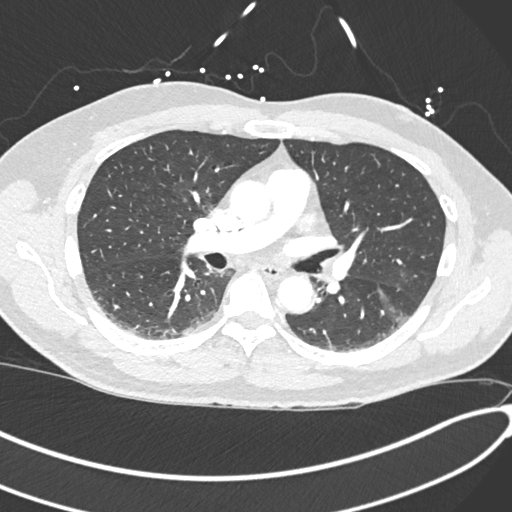
[im 246/403  soft-tissue]
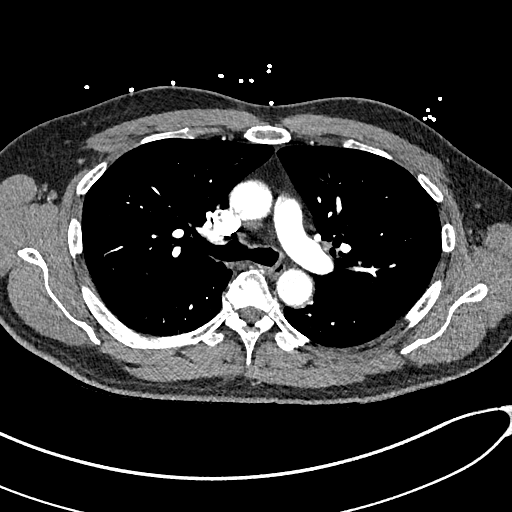
[im 269/403  lung]
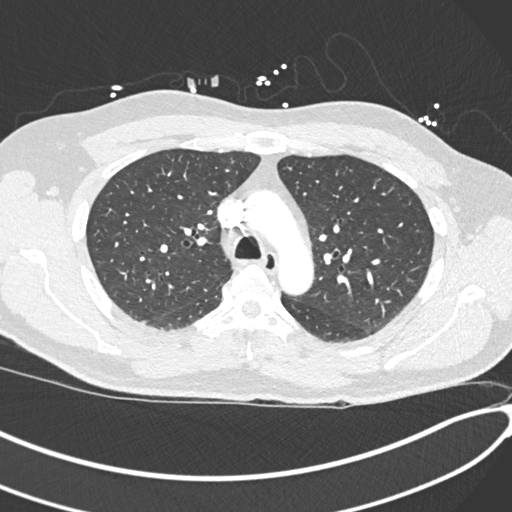
[im 313/403  soft-tissue]
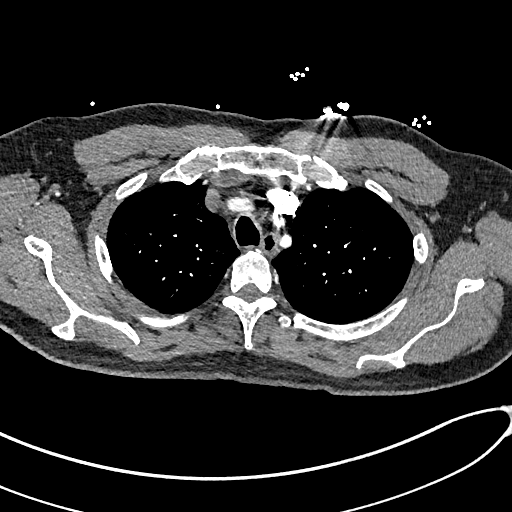
[im 336/403  lung]
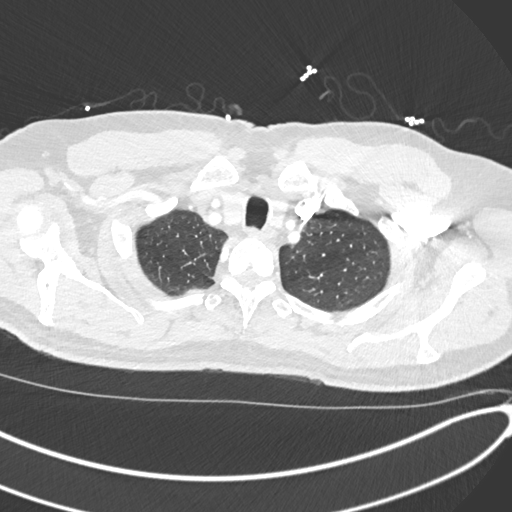
[im 358/403  soft-tissue]
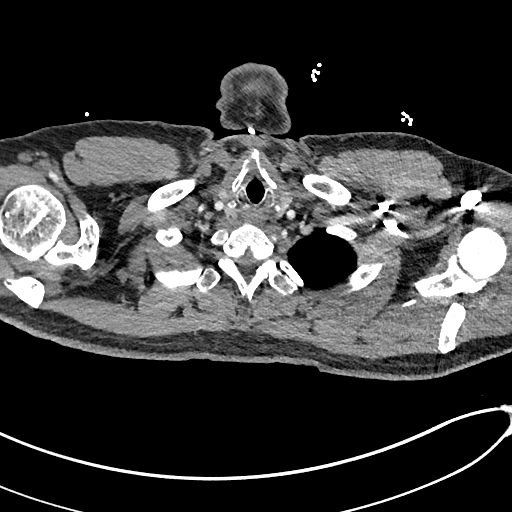
[im 380/403  lung]
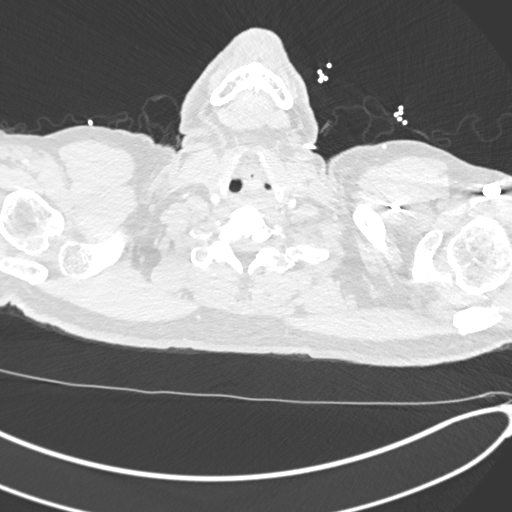

[Series 7: cor soft · coronal · 0.64mm/px · 3 of 137 slices shown]
[im 35/137  soft-tissue]
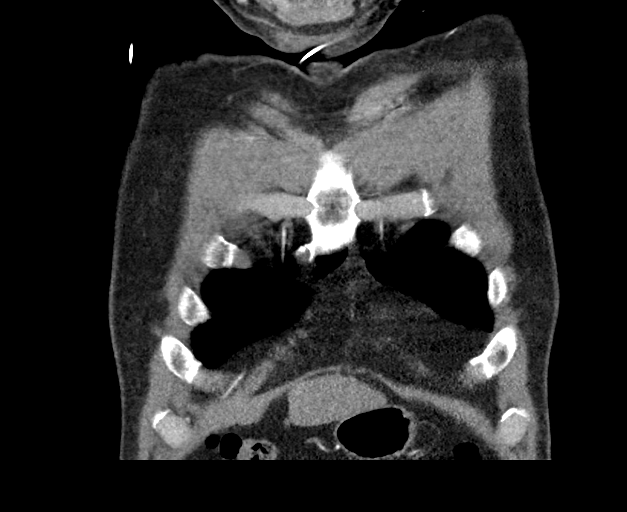
[im 69/137  soft-tissue]
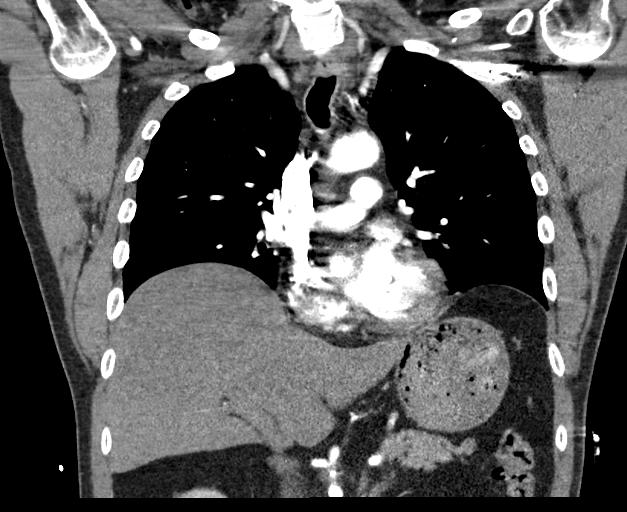
[im 103/137  soft-tissue]
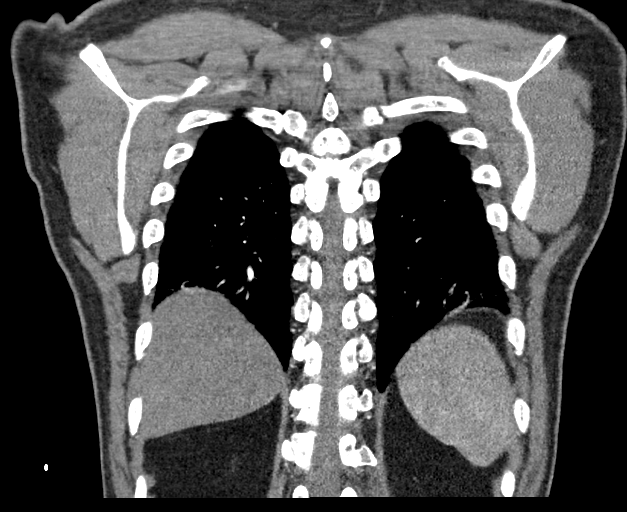

[18 of 46 positions shown; findings below may reference images not displayed]

RADIATION DOSE REDUCTION: This exam was performed according to the
departmental dose-optimization program which includes automated
exposure control, adjustment of the mA and/or kV according to
patient size and/or use of iterative reconstruction technique.

CONTRAST:  75mL OMNIPAQUE IOHEXOL 350 MG/ML SOLN
FINDINGS: Cardiovascular: This is a technically satisfactory study but motion
artifact in the LOWER lungs slightly limits sensitivity.

No pulmonary emboli are identified.

Heart size is normal. LAD coronary artery atherosclerotic
calcifications are noted. No thoracic aortic aneurysm or pericardial
effusion identified.

Mediastinum/Nodes: No enlarged mediastinal, hilar, or axillary lymph
nodes. Thyroid gland, trachea, and esophagus demonstrate no
significant findings.

Lungs/Pleura: Mild bibasilar atelectasis/scarring is unchanged.
There is no evidence of airspace disease, consolidation, mass,
suspicious nodule, pleural effusion or pneumothorax.

Upper Abdomen: No acute abnormality. Probable mild hepatic steatosis
noted.

Musculoskeletal: No acute or suspicious bony abnormalities are
noted.

Review of the MIP images confirms the above findings.
IMPRESSION: 1. No evidence of acute abnormality. No evidence of pulmonary
emboli.
2. Coronary artery disease.
3. Probable mild hepatic steatosis.

## 2022-08-24 ENCOUNTER — Ambulatory Visit
Admission: EM | Admit: 2022-08-24 | Discharge: 2022-08-24 | Disposition: A | Discharge status: Home / Self Care | Payer: 59 | Attending: Urgent Care | Admitting: Urgent Care

## 2022-08-24 ENCOUNTER — Encounter: Payer: Self-pay | Admitting: Emergency Medicine

## 2022-08-24 DIAGNOSIS — R052 Subacute cough: Secondary | ICD-10-CM | POA: Insufficient documentation

## 2022-08-24 DIAGNOSIS — Z20818 Contact with and (suspected) exposure to other bacterial communicable diseases: Secondary | ICD-10-CM | POA: Insufficient documentation

## 2022-08-24 DIAGNOSIS — R070 Pain in throat: Secondary | ICD-10-CM | POA: Diagnosis not present

## 2022-08-24 DIAGNOSIS — B349 Viral infection, unspecified: Secondary | ICD-10-CM | POA: Diagnosis not present

## 2022-08-24 DIAGNOSIS — R030 Elevated blood-pressure reading, without diagnosis of hypertension: Secondary | ICD-10-CM | POA: Insufficient documentation

## 2022-08-24 LAB — POCT RAPID STREP A (OFFICE): Rapid Strep A Screen: NEGATIVE

## 2022-08-24 MED ORDER — PROMETHAZINE-DM 6.25-15 MG/5ML PO SYRP: 5.0000 mL | ORAL_SOLUTION | Freq: Four times a day (QID) | ORAL | 0 refills | Status: DC | PRN

## 2022-08-24 MED ORDER — PSEUDOEPHEDRINE HCL 30 MG PO TABS: 30.0000 mg | ORAL_TABLET | Freq: Three times a day (TID) | ORAL | 0 refills | Status: DC | PRN

## 2022-08-24 MED ORDER — BENZONATATE 100 MG PO CAPS: 100.0000 mg | ORAL_CAPSULE | Freq: Three times a day (TID) | ORAL | 0 refills | Status: DC | PRN

## 2022-08-24 MED ORDER — LEVOCETIRIZINE DIHYDROCHLORIDE 5 MG PO TABS: 5.0000 mg | ORAL_TABLET | Freq: Every evening | ORAL | 0 refills | Status: DC

## 2022-08-24 NOTE — ED Provider Notes (Signed)
[place] [facility]  Note:  This document was prepared using Dragon voice recognition software and may include unintentional dictation errors.  MRN: [id number] DOB: 09-Mar-1956  Subjective:   Juan Schultz is a 67 y.o. male presenting for 3 day history of acute onset throat pain, painful swallowing, sinus congestion, sinus headaches. 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 Had strep exposure and possible pneumonia exposure. No smoking. No asthma. Has allergies. Had COVID 19 in Oct 2023.   No current facility-administered medications for this encounter.  Current Outpatient Medications:    aspirin EC 81 MG tablet, Take 1 tablet (81 mg total) by mouth daily., Disp: , Rfl:    benzonatate (TESSALON) 100 MG capsule, Take 1-2 capsules (100-200 mg total) by mouth 3 (three) times daily as needed for cough., Disp: 60 capsule, Rfl: 0   levocetirizine (XYZAL) 5 MG tablet, Take 1 tablet (5 mg total) by mouth every evening., Disp: 90 tablet, Rfl: 0   montelukast (SINGULAIR) 10 MG tablet, Take 10 mg by mouth daily as needed (allergies)., Disp: , Rfl:    OVER THE COUNTER MEDICATION, Take 1 packet by mouth daily. "Green Vibrance" vitamin supplement, Disp: , Rfl:    predniSONE (DELTASONE) 20 MG tablet, Take 2 tablets daily with breakfast., Disp: 10 tablet, Rfl: 0   promethazine-dextromethorphan (PROMETHAZINE-DM) 6.25-15 MG/5ML syrup, Take 5 mLs by mouth 4 (four) times daily as needed for cough., Disp: 200 mL, Rfl: 0   Saw Palmetto, Serenoa repens, (SAW PALMETTO PO), Take 1 capsule by mouth daily., Disp: , Rfl:    Allergies  Allergen Reactions   Tequin [Gatifloxacin]     Mouth got numb    Past Medical History:  Diagnosis Date   Seasonal allergies      Past Surgical History:  Procedure Laterality Date   COLONOSCOPY N/A 03/14/2015   Procedure: COLONOSCOPY;  Surgeon: Danie Binder, MD;  Location: [facility];   Service: Endoscopy;  Laterality: N/A;  8:30 AM   HERNIA REPAIR  2013   KNEE ARTHROSCOPY Right 2002    Family History  Problem Relation Age of Onset   Parkinsonism Neg Hx     Social History   Tobacco Use   Smoking status: Never   Smokeless tobacco: Never  Substance Use Topics   Alcohol use: No    Alcohol/week: 0.0 standard drinks of alcohol    Comment: Quit in 1981   Drug use: No    ROS   Objective:   Vitals: BP (!) 164/88 (BP Location: Right Arm)   Pulse 98   Temp 97.9 F (36.6 C) (Oral)   Resp 18   SpO2 94%   Physical Exam Constitutional:      General: He is not in acute distress.    Appearance: Normal appearance. He is well-developed and normal weight. He is not ill-appearing, toxic-appearing or diaphoretic.  HENT:     Head: Normocephalic and atraumatic.     Right Ear: Tympanic membrane, ear canal and external ear normal. No drainage, swelling or tenderness. No middle ear effusion. There is no impacted cerumen. Tympanic membrane is not erythematous or bulging.     Left Ear: Tympanic membrane, ear canal and external ear normal. No drainage, swelling or tenderness.  No middle ear effusion. There is no impacted cerumen. Tympanic membrane is not erythematous or bulging.     Nose: Congestion present. No rhinorrhea.     Mouth/Throat:  Mouth: Mucous membranes are moist.     Pharynx: Posterior oropharyngeal erythema (with associated post-nasal drainage) present. No oropharyngeal exudate.  Eyes:     General: No scleral icterus.       Right eye: No discharge.        Left eye: No discharge.     Extraocular Movements: Extraocular movements intact.     Conjunctiva/sclera: Conjunctivae normal.  Cardiovascular:     Rate and Rhythm: Normal rate and regular rhythm.     Heart sounds: Normal heart sounds. No murmur heard.    No friction rub. No gallop.  Pulmonary:     Effort: Pulmonary effort is normal. No respiratory distress.     Breath sounds: Normal breath sounds. No  stridor. No wheezing, rhonchi or rales.  Musculoskeletal:     Cervical back: Normal range of motion and neck supple. No rigidity. No muscular tenderness.  Neurological:     General: No focal deficit present.     Mental Status: He is alert and oriented to person, place, and time.  Psychiatric:        Mood and Affect: Mood normal.        Behavior: Behavior normal.        Thought Content: Thought content normal.    Results for orders placed or performed during the hospital encounter of 08/24/22 (from the past 24 hour(s))  POCT rapid strep A     Status: None   Collection Time: 08/24/22  8:24 AM  Result Value Ref Range   Rapid Strep A Screen Negative Negative     Assessment and Plan :   PDMP not reviewed this encounter.  1. Acute viral syndrome   2. Throat pain   3. Subacute cough   4. Strep throat exposure   5. Elevated blood pressure reading in office without diagnosis of hypertension     Deferred imaging given clear cardiopulmonary exam, hemodynamically stable vital signs. Will manage for viral illness such as viral URI, viral syndrome, viral rhinitis, COVID-19, viral pharyngitis. Recommended supportive care. Offered scripts for symptomatic relief. COVID 19 and strep culture are pending. Counseled patient on potential for adverse effects with medications prescribed/recommended today, ER and return-to-clinic precautions discussed, patient verbalized understanding.     Jaynee Eagles, PA-C 08/24/22 0830

## 2022-08-24 NOTE — Discharge Instructions (Addendum)
We will manage this as a viral illness. For sore throat or cough try using a honey-based tea. Use 3 teaspoons of honey with juice squeezed from half lemon. Place shaved pieces of ginger into 1/2-1 cup of water and warm over stove top. Then mix the ingredients and repeat every 4 hours as needed. Please take ibuprofen 600mg  every 6 hours with food alternating with OR taken together with Tylenol 500mg -650mg  every 6 hours for throat pain, fevers, aches and pains. Hydrate very well with at least 2 liters of water. Eat light meals such as soups (chicken and noodles, vegetable, chicken and wild rice).  Do not eat foods that you are allergic to.  Taking an antihistamine like Xyzal can help against postnasal drainage, sinus congestion which can cause sinus pain, sinus headaches, throat pain, painful swallowing, coughing.  You can take this together with pseudoephedrine (Sudafed) at a dose of 30 mg 3 times a day or twice daily as needed for the same kind of nasal drip, congestion. Use the cough medications as needed.  You strep culture will take about 3-4 days to result.

## 2022-08-24 NOTE — ED Triage Notes (Signed)
Head congestion, cough, headache, sore throat since Saturday.

## 2022-08-25 ENCOUNTER — Encounter: Payer: Self-pay | Admitting: Emergency Medicine

## 2022-08-25 ENCOUNTER — Ambulatory Visit
Admission: EM | Admit: 2022-08-25 | Discharge: 2022-08-25 | Disposition: A | Discharge status: Home / Self Care | Payer: 59

## 2022-08-25 DIAGNOSIS — J069 Acute upper respiratory infection, unspecified: Secondary | ICD-10-CM | POA: Diagnosis not present

## 2022-08-25 NOTE — ED Triage Notes (Signed)
Patient seen yesterday.  Did not get his prescriptions from the pharmacy yesterday.  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  Wants a note to be out the rest of the week from work.  Head congestion, cough, headache and sore throat since Saturday.  States the congestion is started to get into his chest.

## 2022-08-25 NOTE — ED Provider Notes (Signed)
[facility]    CSN: [id number] Arrival date & time: 08/25/22  3818      History   Chief Complaint No chief complaint on file.   HPI Juan Schultz is a 67 y.o. male.   Patient presenting today following up on yesterday's visit for viral upper respiratory infection requesting an extended work note and reevaluation.  His rapid strep test was negative and he is a throat culture pending, was prescribed multiple medications yesterday but states his pharmacy did not have them available until this morning so he plans to pick them up today.  He is still having sore throat, headache, cough, congestion but no fevers, chest pain, shortness of breath.    Past Medical History:  Diagnosis Date   Seasonal allergies     Patient Active Problem List   Diagnosis Date Noted   Special screening for malignant neoplasms, colon    Tremor 01/01/2015    Past Surgical History:  Procedure Laterality Date   COLONOSCOPY N/A 03/14/2015   Procedure: COLONOSCOPY;  Surgeon: Danie Binder, MD;  Location: [facility];  Service: Endoscopy;  Laterality: N/A;  8:30 AM   HERNIA REPAIR  2013   KNEE ARTHROSCOPY Right 2002       Home Medications    Prior to Admission medications   Medication Sig Start Date End Date Taking? Authorizing Provider  aspirin EC 81 MG tablet Take 1 tablet (81 mg total) by mouth daily. 03/06/15   Fay Records, MD  benzonatate (TESSALON) 100 MG capsule Take 1-2 capsules (100-200 mg total) by mouth 3 (three) times daily as needed for cough. 08/24/22   Jaynee Eagles, PA-C  levocetirizine (XYZAL) 5 MG tablet Take 1 tablet (5 mg total) by mouth every evening. 08/24/22   Jaynee Eagles, PA-C  loratadine (CLARITIN) 10 MG tablet Take 10 mg by mouth daily.    [provider]  montelukast (SINGULAIR) 10 MG tablet Take 10 mg by mouth daily as needed (allergies).    [provider]  OVER THE COUNTER MEDICATION Take 1 packet by mouth daily. "Green Vibrance" vitamin  supplement    [provider]  predniSONE (DELTASONE) 20 MG tablet Take 2 tablets daily with breakfast. 10/29/21   Jaynee Eagles, PA-C  promethazine-dextromethorphan (PROMETHAZINE-DM) 6.25-15 MG/5ML syrup Take 5 mLs by mouth 4 (four) times daily as needed for cough. 08/24/22   Jaynee Eagles, PA-C  pseudoephedrine (SUDAFED) 30 MG tablet Take 1 tablet (30 mg total) by mouth every 8 (eight) hours as needed for congestion. 08/24/22   Jaynee Eagles, PA-C  Saw Palmetto, Serenoa repens, (SAW PALMETTO PO) Take 1 capsule by mouth daily.    [provider]    Family History Family History  Problem Relation Age of Onset   Parkinsonism Neg Hx     Social History Social History   Tobacco Use   Smoking status: Never   Smokeless tobacco: Never  Substance Use Topics   Alcohol use: No    Alcohol/week: 0.0 standard drinks of alcohol    Comment: Quit in 1981   Drug use: No     Allergies   Tequin [gatifloxacin]   Review of Systems Review of Systems Per HPI  Physical Exam Triage Vital Signs ED Triage Vitals  Enc Vitals Group     BP 08/25/22 1117 122/80     Pulse Rate 08/25/22 1117 (!) 111     Resp 08/25/22 1117 18     Temp 08/25/22 1117 98.9 F (37.2 C)  Temp Source 08/25/22 1117 Oral     SpO2 08/25/22 1117 94 %     Weight --      Height --      Head Circumference --      Peak Flow --      Pain Score 08/25/22 1120 4     Pain Loc --      Pain Edu? --      Excl. in [place]? --    No data found.  Updated Vital Signs BP 122/80 (BP Location: Right Arm)   Pulse (!) 111   Temp 98.9 F (37.2 C) (Oral)   Resp 18   SpO2 94%   Visual Acuity Right Eye Distance:   Left Eye Distance:   Bilateral Distance:    Right Eye Near:   Left Eye Near:    Bilateral Near:     Physical Exam Vitals and nursing note reviewed.  Constitutional:      Appearance: He is well-developed.  HENT:     Head: Atraumatic.     Right Ear: External ear normal.     Left Ear: External ear normal.      Nose: Rhinorrhea present.     Mouth/Throat:     Pharynx: Posterior oropharyngeal erythema present. No oropharyngeal exudate.  Eyes:     Conjunctiva/sclera: Conjunctivae normal.     Pupils: Pupils are equal, round, and reactive to light.  Cardiovascular:     Rate and Rhythm: Normal rate and regular rhythm.  Pulmonary:     Effort: Pulmonary effort is normal. No respiratory distress.     Breath sounds: No wheezing or rales.  Musculoskeletal:        General: Normal range of motion.     Cervical back: Normal range of motion and neck supple.  Lymphadenopathy:     Cervical: No cervical adenopathy.  Skin:    General: Skin is warm and dry.  Neurological:     Mental Status: He is alert and oriented to person, place, and time.  Psychiatric:        Behavior: Behavior normal.      UC Treatments / Results  Labs (all labs ordered are listed, but only abnormal results are displayed) Labs Reviewed - No data to display  EKG   Radiology No results found.  Procedures Procedures (including critical care time)  Medications Ordered in UC Medications - No data to display  Initial Impression / Assessment and Plan / UC Course  I have reviewed the triage vital signs and the nursing notes.  Pertinent labs & imaging results that were available during my care of the patient were reviewed by me and considered in my medical decision making (see chart for details).     Vital signs and exam consistent with viral respiratory infection.  Throat culture pending, declines viral testing today.  Treat with the prescribed medications from yesterday to include Phenergan DM, Tessalon Perles and allergy medication and cold and congestion medications as needed.  Return for worsening symptoms.  Work note extended.  Final Clinical Impressions(s) / UC Diagnoses   Final diagnoses:  Viral URI with cough   Discharge Instructions   None    ED Prescriptions   None    PDMP not reviewed this encounter.    Volney American, [place] 08/25/22 1213

## 2022-08-27 ENCOUNTER — Encounter: Payer: Self-pay | Admitting: Emergency Medicine

## 2022-08-27 ENCOUNTER — Ambulatory Visit
Admission: EM | Admit: 2022-08-27 | Discharge: 2022-08-27 | Disposition: A | Discharge status: Home / Self Care | Payer: 59

## 2022-08-27 ENCOUNTER — Other Ambulatory Visit: Payer: Self-pay

## 2022-08-27 DIAGNOSIS — H1033 Unspecified acute conjunctivitis, bilateral: Secondary | ICD-10-CM | POA: Diagnosis not present

## 2022-08-27 LAB — CULTURE, GROUP A STREP (THRC)

## 2022-08-27 MED ORDER — POLYMYXIN B-TRIMETHOPRIM 10000-0.1 UNIT/ML-% OP SOLN: 1.0000 [drp] | Freq: Four times a day (QID) | OPHTHALMIC | 0 refills | Status: AC

## 2022-08-27 NOTE — Discharge Instructions (Signed)
Use eyedrops as prescribed.  Recommend using them in both eyes in the event that the infection spreads. Cool compresses to the eyes to help with pain or swelling. May use over-the-counter eyedrops such as Visine or Clear Eyes to help with moisture and decrease redness. Strict handwashing when applying medication.  Avoid rubbing or manipulating the eyes while symptoms persist. If symptoms do not improve within the next 24 to 48 hours, please follow-up in this clinic or with your primary care physician for further evaluation. Follow-up as needed.

## 2022-08-27 NOTE — ED Triage Notes (Signed)
Pt reports bilateral eye drainage and redness for last several days.

## 2022-08-27 NOTE — ED Provider Notes (Signed)
[facility]    CSN: [id number] Arrival date & time: 08/27/22  0806      History   Chief Complaint Chief Complaint  Patient presents with   Eye Problem    HPI Juan Schultz is a 67 y.o. male.   The history is provided by the patient.   The patient presents for complaints of bilateral eye redness and drainage that started over the last 24 to 48 hours.  Patient complains of redness, drainage, and itching to both eyes.  Patient states symptoms started at the same time.  He reports that he has been recently treated for an upper respiratory infection.  He states that this morning when he woke, he did have drainage and matting of his eyes.  Patient reports that he does wear glasses.  He has not taken any medication or use any medication for symptoms.  Past Medical History:  Diagnosis Date   Seasonal allergies     Patient Active Problem List   Diagnosis Date Noted   Special screening for malignant neoplasms, colon    Tremor 01/01/2015    Past Surgical History:  Procedure Laterality Date   COLONOSCOPY N/A 03/14/2015   Procedure: COLONOSCOPY;  Surgeon: West Bali, MD;  [facility]: [facility];  Service: Endoscopy;  Laterality: N/A;  8:30 AM   HERNIA REPAIR  2013   KNEE ARTHROSCOPY Right 2002       Home Medications    Prior to Admission medications   Medication Sig Start Date End Date Taking? Authorizing Provider  albuterol (VENTOLIN HFA) 108 (90 Base) MCG/ACT inhaler Inhale 2 puffs into the lungs as needed for wheezing or shortness of breath.   Yes [provider]  trimethoprim-polymyxin b (POLYTRIM) ophthalmic solution Place 1 drop into both eyes every 6 (six) hours for 7 days. 08/27/22 09/03/22 Yes Blakelee Allington-Warren, Sadie Haber, NP  aspirin EC 81 MG tablet Take 1 tablet (81 mg total) by mouth daily. 03/06/15   Pricilla Riffle, MD  benzonatate (TESSALON) 100 MG capsule Take 1-2 capsules (100-200 mg total) by mouth 3 (three) times daily as needed for cough.  08/24/22   Wallis Bamberg, PA-C  levocetirizine (XYZAL) 5 MG tablet Take 1 tablet (5 mg total) by mouth every evening. 08/24/22   Wallis Bamberg, PA-C  loratadine (CLARITIN) 10 MG tablet Take 10 mg by mouth daily.    [provider]  montelukast (SINGULAIR) 10 MG tablet Take 10 mg by mouth daily as needed (allergies).    [provider]  OVER THE COUNTER MEDICATION Take 1 packet by mouth daily. "Green Vibrance" vitamin supplement    [provider]  predniSONE (DELTASONE) 20 MG tablet Take 2 tablets daily with breakfast. 10/29/21   Wallis Bamberg, PA-C  promethazine-dextromethorphan (PROMETHAZINE-DM) 6.25-15 MG/5ML syrup Take 5 mLs by mouth 4 (four) times daily as needed for cough. 08/24/22   Wallis Bamberg, PA-C  pseudoephedrine (SUDAFED) 30 MG tablet Take 1 tablet (30 mg total) by mouth every 8 (eight) hours as needed for congestion. 08/24/22   Wallis Bamberg, PA-C  Saw Palmetto, Serenoa repens, (SAW PALMETTO PO) Take 1 capsule by mouth daily.    [provider]    Family History Family History  Problem Relation Age of Onset   Parkinsonism Neg Hx     Social History Social History   Tobacco Use   Smoking status: Never   Smokeless tobacco: Never  Substance Use Topics   Alcohol use: No    Alcohol/week: 0.0 standard drinks  of alcohol    Comment: Quit in 1981   Drug use: No     Allergies   Tequin [gatifloxacin]   Review of Systems Review of Systems Per HPI  Physical Exam Triage Vital Signs ED Triage Vitals  Enc Vitals Group     BP 08/27/22 0857 120/78     Pulse Rate 08/27/22 0857 (!) 121     Resp 08/27/22 0857 20     Temp 08/27/22 0857 98.6 F (37 C)     Temp Source 08/27/22 0857 Oral     SpO2 08/27/22 0857 95 %     Weight --      Height --      Head Circumference --      Peak Flow --      Pain Score 08/27/22 0859 0     Pain Loc --      Pain Edu? --      Excl. in [place]? --    No data found.  Updated Vital Signs BP 120/78 (BP Location: Right Arm)    Pulse (!) 121   Temp 98.6 F (37 C) (Oral)   Resp 20   SpO2 95%   Visual Acuity Right Eye Distance:   Left Eye Distance:   Bilateral Distance:    Right Eye Near:   Left Eye Near:    Bilateral Near:     Physical Exam Vitals and nursing note reviewed.  Constitutional:      General: He is not in acute distress.    Appearance: Normal appearance. He is well-developed.  HENT:     Head: Normocephalic and atraumatic.     Right Ear: Tympanic membrane, ear canal and external ear normal.     Left Ear: Tympanic membrane, ear canal and external ear normal.     Nose: Congestion present. No rhinorrhea.     Mouth/Throat:     Mouth: Mucous membranes are moist.     Pharynx: Posterior oropharyngeal erythema present. No oropharyngeal exudate.  Eyes:     General: Lids are normal. Vision grossly intact. No visual field deficit.       Right eye: Discharge present. No foreign body or hordeolum.        Left eye: Discharge present.No foreign body or hordeolum.     Extraocular Movements: Extraocular movements intact.     Right eye: Normal extraocular motion and no nystagmus.     Left eye: Normal extraocular motion and no nystagmus.     Conjunctiva/sclera:     Right eye: Right conjunctiva is injected. Exudate present. No chemosis.    Left eye: Left conjunctiva is injected. Exudate present. No chemosis.    Pupils: Pupils are equal, round, and reactive to light.  Neck:     Thyroid: No thyromegaly.     Trachea: No tracheal deviation.  Cardiovascular:     Rate and Rhythm: Normal rate and regular rhythm.     Pulses: Normal pulses.     Heart sounds: Normal heart sounds.  Pulmonary:     Effort: Pulmonary effort is normal.     Breath sounds: Normal breath sounds.  Abdominal:     General: Bowel sounds are normal. There is no distension.     Palpations: Abdomen is soft.     Tenderness: There is no abdominal tenderness.  Musculoskeletal:     Cervical back: Normal range of motion and neck supple.   Skin:    General: Skin is warm and dry.  Neurological:     Mental Status:  He is alert and oriented to person, place, and time.  Psychiatric:        Behavior: Behavior normal.        Thought Content: Thought content normal.        Judgment: Judgment normal.      UC Treatments / Results  Labs (all labs ordered are listed, but only abnormal results are displayed) Labs Reviewed - No data to display  EKG   Radiology No results found.  Procedures Procedures (including critical care time)  Medications Ordered in UC Medications - No data to display  Initial Impression / Assessment and Plan / UC Course  I have reviewed the triage vital signs and the nursing notes.  Pertinent labs & imaging results that were available during my care of the patient were reviewed by me and considered in my medical decision making (see chart for details).  The patient is well-appearing, is in no acute distress.  Patient is tachycardic, but vital signs are otherwise stable, and he is in no acute distress.  Symptoms are consistent with acute conjunctivitis.  Will treat patient with trimethoprim polymyxin B ophthalmic solution.  Will have patient administer medication in both eyes as both eyes are currently affected.  Supportive care recommendations were provided to the patient to include cool compresses to the eyes, and use of over-the-counter eyedrops such as Visine or Clear Eyes to help with moisture and decreased redness.  Strict handwashing was also reinforced with the patient.  Patient verbalizes understanding, strict return precautions were discussed with the patient.  All questions were answered.  Patient is stable for discharge.  Final Clinical Impressions(s) / UC Diagnoses   Final diagnoses:  Acute conjunctivitis of both eyes, unspecified acute conjunctivitis type     Discharge Instructions      Use eyedrops as prescribed.  Recommend using them in both eyes in the event that the infection  spreads. Cool compresses to the eyes to help with pain or swelling. May use over-the-counter eyedrops such as Visine or Clear Eyes to help with moisture and decrease redness. Strict handwashing when applying medication.  Avoid rubbing or manipulating the eyes while symptoms persist. If symptoms do not improve within the next 24 to 48 hours, please follow-up in this clinic or with your primary care physician for further evaluation. Follow-up as needed.     ED Prescriptions     Medication Sig Dispense Auth. Provider   trimethoprim-polymyxin b (POLYTRIM) ophthalmic solution Place 1 drop into both eyes every 6 (six) hours for 7 days. 10 mL Geary Rufo-Warren, Alda Lea, NP      PDMP not reviewed this encounter.   Tish Men, NP 08/27/22 [phone]

## 2022-08-30 DIAGNOSIS — Z6826 Body mass index (BMI) 26.0-26.9, adult: Secondary | ICD-10-CM | POA: Diagnosis not present

## 2022-08-30 DIAGNOSIS — J209 Acute bronchitis, unspecified: Secondary | ICD-10-CM | POA: Diagnosis not present

## 2022-08-30 DIAGNOSIS — J329 Chronic sinusitis, unspecified: Secondary | ICD-10-CM | POA: Diagnosis not present

## 2022-08-30 DIAGNOSIS — H109 Unspecified conjunctivitis: Secondary | ICD-10-CM | POA: Diagnosis not present

## 2022-10-31 ENCOUNTER — Encounter (HOSPITAL_COMMUNITY): Payer: Self-pay

## 2022-10-31 ENCOUNTER — Emergency Department (HOSPITAL_COMMUNITY): Payer: 59

## 2022-10-31 ENCOUNTER — Emergency Department (HOSPITAL_COMMUNITY)
Admission: EM | Admit: 2022-10-31 | Discharge: 2022-10-31 | Disposition: A | Discharge status: Home / Self Care | Payer: 59 | Attending: Emergency Medicine | Admitting: Emergency Medicine

## 2022-10-31 ENCOUNTER — Other Ambulatory Visit: Payer: Self-pay

## 2022-10-31 DIAGNOSIS — R1011 Right upper quadrant pain: Secondary | ICD-10-CM | POA: Diagnosis not present

## 2022-10-31 DIAGNOSIS — K8020 Calculus of gallbladder without cholecystitis without obstruction: Secondary | ICD-10-CM

## 2022-10-31 DIAGNOSIS — Z0389 Encounter for observation for other suspected diseases and conditions ruled out: Secondary | ICD-10-CM | POA: Diagnosis not present

## 2022-10-31 DIAGNOSIS — K824 Cholesterolosis of gallbladder: Secondary | ICD-10-CM | POA: Diagnosis not present

## 2022-10-31 DIAGNOSIS — K760 Fatty (change of) liver, not elsewhere classified: Secondary | ICD-10-CM | POA: Diagnosis not present

## 2022-10-31 DIAGNOSIS — R1084 Generalized abdominal pain: Secondary | ICD-10-CM

## 2022-10-31 DIAGNOSIS — R109 Unspecified abdominal pain: Secondary | ICD-10-CM | POA: Diagnosis not present

## 2022-10-31 LAB — URINALYSIS, ROUTINE W REFLEX MICROSCOPIC
Bilirubin Urine: NEGATIVE
Glucose, UA: NEGATIVE mg/dL
Hgb urine dipstick: NEGATIVE
Ketones, ur: >80 mg/dL — AB
Leukocytes,Ua: NEGATIVE
Nitrite: NEGATIVE
Protein, ur: NEGATIVE mg/dL
Specific Gravity, Urine: 1.02 (ref 1.005–1.030)
pH: 5.5 (ref 5.0–8.0)

## 2022-10-31 LAB — CBC
HCT: 42.7 % (ref 39.0–52.0)
Hemoglobin: 14.1 g/dL (ref 13.0–17.0)
MCH: 29.9 pg (ref 26.0–34.0)
MCHC: 33 g/dL (ref 30.0–36.0)
MCV: 90.5 fL (ref 80.0–100.0)
Platelets: 219 10*3/uL (ref 150–400)
RBC: 4.72 MIL/uL (ref 4.22–5.81)
RDW: 12.9 % (ref 11.5–15.5)
WBC: 7.8 10*3/uL (ref 4.0–10.5)
nRBC: 0 % (ref 0.0–0.2)

## 2022-10-31 LAB — COMPREHENSIVE METABOLIC PANEL
ALT: 44 U/L (ref 0–44)
AST: 29 U/L (ref 15–41)
Albumin: 4 g/dL (ref 3.5–5.0)
Alkaline Phosphatase: 74 U/L (ref 38–126)
Anion gap: 11 (ref 5–15)
BUN: 31 mg/dL — ABNORMAL HIGH (ref 8–23)
CO2: 25 mmol/L (ref 22–32)
Calcium: 8.9 mg/dL (ref 8.9–10.3)
Chloride: 99 mmol/L (ref 98–111)
Creatinine, Ser: 0.96 mg/dL (ref 0.61–1.24)
GFR, Estimated: >60 mL/min (ref >60)
Glucose, Bld: 144 mg/dL — ABNORMAL HIGH (ref 70–99)
Potassium: 3.7 mmol/L (ref 3.5–5.1)
Sodium: 135 mmol/L (ref 135–145)
Total Bilirubin: 0.5 mg/dL (ref 0.3–1.2)
Total Protein: 7.1 g/dL (ref 6.5–8.1)

## 2022-10-31 LAB — LIPASE, BLOOD: Lipase: 35 U/L (ref 11–51)

## 2022-10-31 LAB — TROPONIN I (HIGH SENSITIVITY)
Troponin I (High Sensitivity): 3 ng/L (ref <18)
Troponin I (High Sensitivity): 3 ng/L (ref <18)

## 2022-10-31 MED ORDER — IOHEXOL 300 MG/ML  SOLN
100.0000 mL | Freq: Once | INTRAMUSCULAR | Status: AC | PRN
  Administered 2022-10-31: 100 mL via INTRAVENOUS

## 2022-10-31 MED ORDER — HYDROMORPHONE HCL 1 MG/ML IJ SOLN
0.5000 mg | Freq: Once | INTRAMUSCULAR | Status: AC
  Administered 2022-10-31: 0.5 mg via INTRAVENOUS
  Filled 2022-10-31: qty 0.5

## 2022-10-31 MED ORDER — HYDROMORPHONE HCL 1 MG/ML IJ SOLN
1.0000 mg | Freq: Once | INTRAMUSCULAR | Status: AC
  Administered 2022-10-31: 1 mg via INTRAVENOUS
  Filled 2022-10-31: qty 1

## 2022-10-31 MED ORDER — AMOXICILLIN-POT CLAVULANATE 875-125 MG PO TABS: 1.0000 | ORAL_TABLET | Freq: Two times a day (BID) | ORAL | 0 refills | Status: DC

## 2022-10-31 MED ORDER — OXYCODONE-ACETAMINOPHEN 5-325 MG PO TABS: 1.0000 | ORAL_TABLET | Freq: Four times a day (QID) | ORAL | 0 refills | Status: DC | PRN

## 2022-10-31 MED ORDER — SODIUM CHLORIDE 0.9 % IV BOLUS
1000.0000 mL | Freq: Once | INTRAVENOUS | Status: AC
  Administered 2022-10-31: 1000 mL via INTRAVENOUS

## 2022-10-31 NOTE — ED Triage Notes (Signed)
Pt arrived via POV c/o generalized abdominal pain that is described burning with sharp radiation pain to his back. Pt reports pain began a few hours ago. Pt does endorses some bloating and gas, but pain is not releived by anything.

## 2022-10-31 NOTE — Discharge Instructions (Signed)
ER for worsening symptoms See a general surgeon in the office this week  Oxycodone for pain that is severe  Augmentin twice daily for 7 days

## 2022-10-31 NOTE — ED Notes (Signed)
MD Bridges at bedside.

## 2022-10-31 NOTE — ED Provider Notes (Addendum)
Consulted with Dr. Constance Haw for evaluatoin of possible cholecystitis.  VS stable [place] with mild GB thickening  The patient has been seen and has declined surgery, they want to go home, Dr. Constance Haw was kind enough to see this patient and make recommendations including 1 week of Augmentin and some pain medication, the patient understands they should return if symptoms get worse   Juan Chapel, MD 10/31/22 0945    Juan Chapel, MD 10/31/22 1239

## 2022-10-31 NOTE — ED Notes (Signed)
Patient ambulates to the bathroom with steady gait. Pending [place].

## 2022-10-31 NOTE — ED Notes (Signed)
ED Provider at bedside. 

## 2022-10-31 NOTE — Consult Note (Signed)
[place]  Reason for Consult: Gallstones/ polyps versus cholecystitis  Referring Physician:  Dr. Sabra Heck   Chief Complaint   Abdominal Pain     HPI: Juan Schultz is a 67 y.o. male with acute onset of RUQ pain, back pain that started overnight after he ate some chicken wings. He had some associated bloating. He reports that he thinks he had something similar 2 weeks ago. He is otherwise healthy.  He is here today with his wife. He had a workup with labs that were reassuring and CT showed some concern for a distended gallbladder. His US showed some wall thickening and possible polyps but otherwise no definitive cholecystitis.  He is feeling better and just feels sore.    Past Medical History:  Diagnosis Date   Seasonal allergies     Past Surgical History:  Procedure Laterality Date   COLONOSCOPY N/A 03/14/2015   Procedure: COLONOSCOPY;  Surgeon: Danie Binder, MD;  [facility];  Service: Endoscopy;  Laterality: N/A;  8:30 AM   HERNIA REPAIR  2013   KNEE ARTHROSCOPY Right 2002    Family History  Problem Relation Age of Onset   Parkinsonism Neg Hx     Social History   Tobacco Use   Smoking status: Never   Smokeless tobacco: Never  Vaping Use   Vaping Use: Never used  Substance Use Topics   Alcohol use: No    Alcohol/week: 0.0 standard drinks of alcohol    Comment: Quit in 1981   Drug use: No    Medications: I have reviewed the patient's current medications. Current Outpatient Medications  Medication Instructions   albuterol (VENTOLIN HFA) 108 (90 Base) MCG/ACT inhaler 2 puffs, Inhalation, As needed   amoxicillin-clavulanate (AUGMENTIN) 875-125 MG tablet 1 tablet, Oral, Every 12 hours   aspirin EC 81 mg, Oral, Daily   benzonatate (TESSALON) 100-200 mg, Oral, 3 times daily PRN   levocetirizine (XYZAL) 5 mg, Oral, Every evening   loratadine (CLARITIN) 10 mg, Oral, Daily   montelukast (SINGULAIR) 10 mg, Oral, Daily PRN   OVER  THE COUNTER MEDICATION 1 packet, Oral, Daily, "Green Vibrance" vitamin supplement    oxyCODONE-acetaminophen (PERCOCET/ROXICET) 5-325 MG tablet 1 tablet, Oral, Every 6 hours PRN   predniSONE (DELTASONE) 20 MG tablet Take 2 tablets daily with breakfast.   promethazine-dextromethorphan (PROMETHAZINE-DM) 6.25-15 MG/5ML syrup 5 mLs, Oral, 4 times daily PRN   pseudoephedrine (SUDAFED) 30 mg, Oral, Every 8 hours PRN   Saw Palmetto, Serenoa repens, (SAW PALMETTO PO) 1 capsule, Oral, Daily     Allergies  Allergen Reactions   Tequin [Gatifloxacin]     Mouth got numb     ROS:  A comprehensive review of systems was negative except for: Gastrointestinal: positive for abdominal pain and back pain  Blood pressure 125/76, pulse 83, temperature 97.7 F (36.5 C), temperature source Oral, resp. rate 18, height 5' 6.5" (1.689 m), weight 65.8 kg, SpO2 100 %. Physical Exam Vitals reviewed.  HENT:     Head: Normocephalic.  Cardiovascular:     Rate and Rhythm: Normal rate.  Pulmonary:     Effort: Pulmonary effort is normal.  Abdominal:     General: There is no distension.     Palpations: Abdomen is soft.     Tenderness: There is abdominal tenderness in the right upper quadrant.     Hernia: No hernia is present.  Musculoskeletal:     Comments: Moves all extremities   Neurological:  General: No focal deficit present.     Mental Status: He is alert and oriented to person, place, and time.  Psychiatric:        Mood and Affect: Mood normal.        Behavior: Behavior normal.     Results: Results for orders placed or performed during the hospital encounter of 10/31/22 (from the past 48 hour(s))  CBC     Status: None   Collection Time: 10/31/22  1:40 AM  Result Value Ref Range   WBC 7.8 4.0 - 10.5 K/uL   RBC 4.72 4.22 - 5.81 MIL/uL   Hemoglobin 14.1 13.0 - 17.0 g/dL   HCT 42.7 39.0 - 52.0 %   MCV 90.5 80.0 - 100.0 fL   MCH 29.9 26.0 - 34.0 pg   MCHC 33.0 30.0 - 36.0 g/dL   RDW 12.9 11.5 -  15.5 %   Platelets 219 150 - 400 K/uL   nRBC 0.0 0.0 - 0.2 %    Comment: Performed at [facility], [address], [place], [place] [address]  Comprehensive metabolic panel     Status: Abnormal   Collection Time: 10/31/22  1:40 AM  Result Value Ref Range   Sodium 135 135 - 145 mmol/L   Potassium 3.7 3.5 - 5.1 mmol/L   Chloride 99 98 - 111 mmol/L   CO2 25 22 - 32 mmol/L   Glucose, Bld 144 (H) 70 - 99 mg/dL    Comment: Glucose reference range applies only to samples taken after fasting for at least 8 hours.   BUN 31 (H) 8 - 23 mg/dL   Creatinine, Ser 0.96 0.61 - 1.24 mg/dL   Calcium 8.9 8.9 - 10.3 mg/dL   Total Protein 7.1 6.5 - 8.1 g/dL   Albumin 4.0 3.5 - 5.0 g/dL   AST 29 15 - 41 U/L   ALT 44 0 - 44 U/L   Alkaline Phosphatase 74 38 - 126 U/L   Total Bilirubin 0.5 0.3 - 1.2 mg/dL   GFR, Estimated >60 >60 mL/min    Comment: (NOTE) Calculated using the CKD-EPI Creatinine Equation (2021)    Anion gap 11 5 - 15    Comment: Performed at [facility], [address] [place], [place] [address]  Lipase, blood     Status: None   Collection Time: 10/31/22  1:40 AM  Result Value Ref Range   Lipase 35 11 - 51 U/L    Comment: Performed at [facility], [address], [place], [place] [address]  Troponin I (High Sensitivity)     Status: None   Collection Time: 10/31/22  1:40 AM  Result Value Ref Range   Troponin I (High Sensitivity) 3 <18 ng/L    Comment: (NOTE) Elevated high sensitivity troponin I (hsTnI) values and significant  changes across serial measurements may suggest ACS but many other  chronic and acute conditions are known to elevate hsTnI results.  Refer to the "Links" section for chest pain algorithms and additional  guidance. Performed at [facility], [address], [place], [place] [address]   Urinalysis, Routine w reflex microscopic -Urine, Clean Catch     Status: Abnormal   Collection Time: 10/31/22  5:23 AM  Result Value Ref Range   Color, Urine YELLOW YELLOW    APPearance CLEAR CLEAR   Specific Gravity, Urine 1.020 1.005 - 1.030   pH 5.5 5.0 - 8.0   Glucose, UA NEGATIVE NEGATIVE mg/dL   Hgb urine dipstick NEGATIVE NEGATIVE  Bilirubin Urine NEGATIVE NEGATIVE   Ketones, ur >80 (A) NEGATIVE mg/dL   Protein, ur NEGATIVE NEGATIVE mg/dL   Nitrite NEGATIVE NEGATIVE   Leukocytes,Ua NEGATIVE NEGATIVE    Comment: Microscopic not done on urines with negative protein, blood, leukocytes, nitrite, or glucose < 500 mg/dL. Performed at [facility], [address] [place], [place] [address]   Troponin I (High Sensitivity)     Status: None   Collection Time: 10/31/22  5:57 AM  Result Value Ref Range   Troponin I (High Sensitivity) 3 <18 ng/L    Comment: (NOTE) Elevated high sensitivity troponin I (hsTnI) values and significant  changes across serial measurements may suggest ACS but many other  chronic and acute conditions are known to elevate hsTnI results.  Refer to the "Links" section for chest pain algorithms and additional  guidance. Performed at [facility], [address] [place], [place] [address]    Reviewed imaging and showed to patient and wife US Abdomen Limited RUQ (LIVER/GB)  Result Date: 10/31/2022 CLINICAL DATA:  [id number] RUQ abdominal pain [id number] EXAM: ULTRASOUND ABDOMEN LIMITED RIGHT UPPER QUADRANT COMPARISON:  CT 10/31/2022 FINDINGS: Gallbladder: Gallbladder wall thickness of 3.6 mm. Multiple gallbladder wall polyps, largest measuring up to 7 mm. No shadowing gallstone or biliary sludge. No pericholecystic fluid. No sonographic Murphy sign noted by sonographer. Common bile duct: Diameter: 4.5 mm. Liver: No focal lesion identified. Diffusely increased hepatic parenchymal echogenicity. Portal vein is patent on color Doppler imaging with normal direction of blood flow towards the liver. Other: None. IMPRESSION: 1. Mild gallbladder wall thickening. No other sonographic evidence of acute cholecystitis. 2. Multiple gallbladder polyps, largest  measuring up to 7 mm. Differential includes benign polyps, adenoma, versus small cancer. Follow-up ultrasound in 1 year is recommended. This recommendation follows ACR consensus guidelines: White Paper of the ACR Incidental Findings Committee II on Gallbladder and Biliary Findings. J Am Coll Radiol 2013:;10:953-956. 3. The echogenicity of the liver is increased. This is a nonspecific finding but is most commonly seen with fatty infiltration of the liver. There are no obvious focal liver lesions. Electronically Signed   By: Davina Poke D.O.   On: 10/31/2022 09:31   CT ABDOMEN PELVIS W CONTRAST  Result Date: 10/31/2022 CLINICAL DATA:  67 year old male with abdominal pain. Burning and sharp pain radiating to the back acute onset. EXAM: CT ABDOMEN AND PELVIS WITH CONTRAST TECHNIQUE: Multidetector CT imaging of the abdomen and pelvis was performed using the standard protocol following bolus administration of intravenous contrast. RADIATION DOSE REDUCTION: This exam was performed according to the departmental dose-optimization program which includes automated exposure control, adjustment of the mA and/or kV according to patient size and/or use of iterative reconstruction technique. CONTRAST:  120m OMNIPAQUE IOHEXOL 300 MG/ML  SOLN COMPARISON:  Noncontrast CT Abdomen and Pelvis 03/01/2006. FINDINGS: Lower chest: Lower lung volumes with new linear and dependent atelectasis or scarring in the lower lobes, greater on the right. No cardiomegaly or pericardial effusion. No lung base consolidation or effusion. There is faint calcified coronary artery atherosclerosis on series 2, image 12. Hepatobiliary: Gallbladder appears mildly indistinct on series 5, image 45. Subtle evidence of cholelithiasis. Liver enhancement and bile ducts remain within normal limits. Pancreas: Negative. Spleen: Negative. Adrenals/Urinary Tract: Negative. Symmetric renal enhancement and contrast excretion on the delayed images. Stomach/Bowel:  Moderate diverticulosis of the descending and redundant sigmoid colon. Retained stool in those segments. No active inflammation. Mild transverse colon retained stool. Redundant hepatic flexure is mostly decompressed. Negative cecum and normal appendix  on series 2, image 69. Negative terminal ileum. No dilated small bowel. Stomach and duodenum appear negative. No free air or free fluid. Vascular/Lymphatic: Normal caliber abdominal aorta, visible aorta negative for dissection or aneurysm. Mild-to-moderate iliofemoral atherosclerosis but major arterial structures in the abdomen and pelvis remain patent. Portal venous system is patent. No lymphadenopathy. Reproductive: Prostate hypertrophy appears progressed since 2007. Otherwise negative. Other: No pelvis free fluid. Musculoskeletal: No acute osseous abnormality identified. IMPRESSION: 1. Gallbladder appears mildly indistinct, with subtle evidence of cholelithiasis. Consider Acute Cholecystitis, and Right Upper Quadrant Ultrasound may be valuable. 2. No other acute or inflammatory process identified in the abdomen or pelvis. Normal appendix. Diverticulosis of the distal colon without active inflammation. 3. Calcified coronary artery atherosclerosis. Electronically Signed   By: Genevie Ann M.D.   On: 10/31/2022 04:25   DG Chest Port 1 View  Result Date: 10/31/2022 CLINICAL DATA:  Epigastric EXAM: PORTABLE CHEST 1 VIEW COMPARISON:  11/21/2021 FINDINGS: Cardiac and mediastinal contours are within normal limits. No focal pulmonary opacity. No pleural effusion or pneumothorax. No acute osseous abnormality. IMPRESSION: No acute cardiopulmonary process. Electronically Signed   By: Merilyn Baba M.D.   On: 10/31/2022 03:37     Assessment & Plan:  BAYLIN SUBA is a 67 y.o. male with gallstones and possible polyps. Discussed with them that these will have to be at least monitored. Discussed that he could have an early cholecystitis but that nothing definitive on the  imaging or labs. Discussed this could get worse. Gave option of home versus admission and surgery tomorrow.   They asked about option of lithotripsy and discussed with them that this was not standard of care and that I did not know of anyone in [place] that did this procedure.    I counseled the patient about the indication, risks and benefits of robotic assisted laparoscopic cholecystectomy.  He understands there is a very small chance for bleeding, infection, injury to normal structures (including common bile duct), conversion to open surgery, persistent symptoms, evolution of postcholecystectomy diarrhea, need for secondary interventions, anesthesia reaction, cardiopulmonary issues and other risks not specifically detailed here. I described the expected recovery, the plan for follow-up and the restrictions during the recovery phase.  All questions were answered.  They want to hold off on surgery for now.  Augmentin given for possible early cholecystitis Pain medication  Gave them option of making an appt with me and they said they would decide and call the office if they wanted to  follow up     All questions were answered to the satisfaction of the patient and family.    Virl Cagey 10/31/2022, 12:37 PM

## 2022-10-31 NOTE — ED Notes (Signed)
Patient transported to CT 

## 2022-10-31 NOTE — ED Provider Notes (Signed)
[facility] Emergency Department Provider Note MRN:  [id number]  Arrival date & time: 10/31/22     Chief Complaint   Abdominal Pain   History of Present Illness   Juan Schultz is a 67 y.o. year-old male with no pertinent past medical history presenting to the ED with chief complaint of abdominal pain.  Diffuse burning abdominal pain for the past several hours.  Not going away.  Is never happened before.  No fever, no nausea vomiting, no diarrhea.  No chest pain or shortness of breath.  Review of Systems  A thorough review of systems was obtained and all systems are negative except as noted in the HPI and PMH.   Patient's Health History    Past Medical History:  Diagnosis Date   Seasonal allergies     Past Surgical History:  Procedure Laterality Date   COLONOSCOPY N/A 03/14/2015   Procedure: COLONOSCOPY;  Surgeon: Danie Binder, MD;  [facility];  Service: Endoscopy;  Laterality: N/A;  8:30 AM   HERNIA REPAIR  2013   KNEE ARTHROSCOPY Right 2002    Family History  Problem Relation Age of Onset   Parkinsonism Neg Hx     Social History   Socioeconomic History   Marital status: Married    Spouse name: Juan Schultz   Number of children: 2   Years of education: College   Highest education level: Not on file  Occupational History   Occupation: Engineering geologist  Tobacco Use   Smoking status: Never   Smokeless tobacco: Never  Vaping Use   Vaping Use: Never used  Substance and Sexual Activity   Alcohol use: No    Alcohol/week: 0.0 standard drinks of alcohol    Comment: Quit in 1981   Drug use: No   Sexual activity: Yes  Other Topics Concern   Not on file  Social History Narrative   Lives at home with wife and son.   Caffeine use: 1 cups coffee per day   Drinks tea occass   Social Determinants of Health   Financial Resource Strain: Not on file  Food Insecurity: Not on file  Transportation Needs: Not on file  Physical Activity:  Not on file  Stress: Not on file  Social Connections: Not on file  Intimate Partner Violence: Not on file     Physical Exam   Vitals:   10/31/22 0530 10/31/22 0600  BP: 126/80 116/80  Pulse: 71 76  Resp: 17   Temp:    SpO2: 97% 96%    CONSTITUTIONAL: Well-appearing, NAD NEURO/PSYCH:  Alert and oriented x 3, no focal deficits EYES:  eyes equal and reactive ENT/NECK:  no LAD, no JVD CARDIO: Regular rate, well-perfused, normal S1 and S2 PULM:  CTAB no wheezing or rhonchi GI/GU:  non-distended, non-tender MSK/SPINE:  No gross deformities, no edema SKIN:  no rash, atraumatic   *Additional and/or pertinent findings included in MDM below  Diagnostic and Interventional Summary    EKG Interpretation  Date/Time:  Sunday October 31 2022 03:22:48 EDT Ventricular Rate:  73 PR Interval:  178 QRS Duration: 97 QT Interval:  415 QTC Calculation: 458 R Axis:   48 Text Interpretation: Sinus rhythm RSR' in V1 or V2, right VCD or RVH Confirmed by Gerlene Fee [phone]) on 10/31/2022 3:58:16 AM       Labs Reviewed  COMPREHENSIVE METABOLIC PANEL - Abnormal; Notable for the following components:      Result Value   Glucose, Bld 144 (*)  BUN 31 (*)    All other components within normal limits  CBC  LIPASE, BLOOD  URINALYSIS, ROUTINE W REFLEX MICROSCOPIC  TROPONIN I (HIGH SENSITIVITY)  TROPONIN I (HIGH SENSITIVITY)    CT ABDOMEN PELVIS W CONTRAST  Final Result    DG Chest Port 1 View  Final Result    US Abdomen Limited RUQ (LIVER/GB)    (Results Pending)    Medications  sodium chloride 0.9 % bolus 1,000 mL (0 mLs Intravenous Stopped 10/31/22 0525)  HYDROmorphone (DILAUDID) injection 1 mg (1 mg Intravenous Given 10/31/22 0314)  iohexol (OMNIPAQUE) 300 MG/ML solution 100 mL (100 mLs Intravenous Contrast Given 10/31/22 0358)  HYDROmorphone (DILAUDID) injection 0.5 mg (0.5 mg Intravenous Given 10/31/22 0523)     Procedures  /  Critical Care Procedures  ED Course and Medical  Decision Making  Initial Impression and Ddx Differential diagnosis includes bowel obstruction, pancreatitis, diverticulitis, perforated viscus, gastroenteritis.  Past medical/surgical history that increases complexity of ED encounter: None  Interpretation of Diagnostics I personally reviewed the EKG and my interpretation is as follows: Sinus rhythm  Labs overall reassuring with no significant blood count or electrolyte disturbance.  Patient Reassessment and Ultimate Disposition/Management     CT with some nonspecific findings of the gallbladder, with radiology recommending ultrasound.  Patient's pain does seem to be more localizing to the right upper quadrant on reassessment.  Continues to have pain.  Ultrasound ordered, will await results.  Signed out to oncoming provider.  Patient management required discussion with the following services or consulting groups:  None  Complexity of Problems Addressed Acute illness or injury that poses threat of life of bodily function  Additional Data Reviewed and Analyzed Further history obtained from: Further history from spouse/family member  Additional Factors Impacting ED Encounter Risk Use of parenteral controlled substances and Consideration of hospitalization  Barth Kirks. [place] Small, [place] [email or web address]  Final Clinical Impressions(s) / ED Diagnoses     ICD-10-CM   1. Generalized abdominal pain  R10.84       ED Discharge Orders     None        Discharge Instructions Discussed with and Provided to Patient:   Discharge Instructions   None      Maudie Flakes, MD 10/31/22 [phone]

## 2022-11-02 DIAGNOSIS — K8010 Calculus of gallbladder with chronic cholecystitis without obstruction: Secondary | ICD-10-CM | POA: Diagnosis not present

## 2022-11-02 DIAGNOSIS — K8080 Other cholelithiasis without obstruction: Secondary | ICD-10-CM | POA: Diagnosis not present

## 2022-11-02 DIAGNOSIS — K810 Acute cholecystitis: Secondary | ICD-10-CM | POA: Diagnosis not present

## 2022-11-02 DIAGNOSIS — Z6823 Body mass index (BMI) 23.0-23.9, adult: Secondary | ICD-10-CM | POA: Diagnosis not present

## 2022-11-05 DIAGNOSIS — K8010 Calculus of gallbladder with chronic cholecystitis without obstruction: Secondary | ICD-10-CM | POA: Diagnosis not present

## 2022-11-10 ENCOUNTER — Telehealth: Payer: Self-pay | Admitting: *Deleted

## 2022-11-10 NOTE — Telephone Encounter (Signed)
Surgical Date: 11/17/2022 Procedure: Xi Robotic Assisted Lap Cholecystectomy CPT: [id number] Dx: K80.20- Cholelithiasis  Received call from patient [phone]~ telephone.   Patient states that he would like to defer surgery at this time. States that he is much improved and does not want to proceed.   Surgery cancelled.

## 2022-11-12 ENCOUNTER — Encounter (HOSPITAL_COMMUNITY): Payer: 59

## 2022-11-16 ENCOUNTER — Ambulatory Visit: Payer: 59 | Admitting: General Surgery

## 2022-11-17 ENCOUNTER — Ambulatory Visit: Admit: 2022-11-17 | Payer: 59 | Admitting: General Surgery

## 2022-11-17 SURGERY — CHOLECYSTECTOMY, ROBOT-ASSISTED, LAPAROSCOPIC
Anesthesia: General

## 2022-11-27 ENCOUNTER — Ambulatory Visit
Admission: EM | Admit: 2022-11-27 | Discharge: 2022-11-27 | Disposition: A | Discharge status: Home / Self Care | Payer: 59 | Attending: Physician Assistant | Admitting: Physician Assistant

## 2022-11-27 DIAGNOSIS — J069 Acute upper respiratory infection, unspecified: Secondary | ICD-10-CM

## 2022-11-27 MED ORDER — PREDNISONE 20 MG PO TABS: ORAL_TABLET | ORAL | 0 refills | Status: DC

## 2022-11-27 MED ORDER — BENZONATATE 100 MG PO CAPS: 100.0000 mg | ORAL_CAPSULE | Freq: Three times a day (TID) | ORAL | 0 refills | Status: DC | PRN

## 2022-11-27 NOTE — ED Provider Notes (Signed)
[facility]    CSN: [id number] Arrival date & time: 11/27/22  1140      History   Chief Complaint No chief complaint on file.   HPI Juan Schultz is a 67 y.o. male.   Patient complaining of a cough, postnasal drip, congestion that started about 4 days ago.  He has been taking Mucinex and using his inhaler with some relief.  He denies fever, chills, shortness of breath, wheezing.  He denies body aches.  He reports wife is sick with similar symptoms last week and diagnosed with sinus infection.  Patient states he wants to get control of symptoms before progresses to bronchitis and pneumonia.  He reports he has been prone to episodes of bronchitis in the past.     Past Medical History:  Diagnosis Date   Seasonal allergies     Patient Active Problem List   Diagnosis Date Noted   Symptomatic cholelithiasis 10/31/2022   Special screening for malignant neoplasms, colon    Tremor 01/01/2015    Past Surgical History:  Procedure Laterality Date   COLONOSCOPY N/A 03/14/2015   Procedure: COLONOSCOPY;  Surgeon: West BaliSandi L Fields, MD;  [facility]: [facility];  Service: Endoscopy;  Laterality: N/A;  8:30 AM   HERNIA REPAIR  08/24/2011   KNEE ARTHROSCOPY Right 08/23/2000       Home Medications    Prior to Admission medications   Medication Sig Start Date End Date Taking? Authorizing Provider  albuterol (VENTOLIN HFA) 108 (90 Base) MCG/ACT inhaler Inhale 2 puffs into the lungs as needed for wheezing or shortness of breath.    [provider]  amoxicillin-clavulanate (AUGMENTIN) 875-125 MG tablet Take 1 tablet by mouth every 12 (twelve) hours. 10/31/22   Eber HongMiller, Brian, MD  aspirin EC 81 MG tablet Take 1 tablet (81 mg total) by mouth daily. 03/06/15   Pricilla Riffleoss, Paula V, MD  benzonatate (TESSALON) 100 MG capsule Take 1-2 capsules (100-200 mg total) by mouth 3 (three) times daily as needed for cough. 11/27/22   Ward, Tylene FantasiaJessica Z, PA-C  levocetirizine (XYZAL) 5 MG tablet Take  1 tablet (5 mg total) by mouth every evening. 08/24/22   Wallis BambergMani, Mario, PA-C  loratadine (CLARITIN) 10 MG tablet Take 10 mg by mouth daily.    [provider]  montelukast (SINGULAIR) 10 MG tablet Take 10 mg by mouth daily as needed (allergies).    [provider]  OVER THE COUNTER MEDICATION Take 1 packet by mouth daily. "Green Vibrance" vitamin supplement    [provider]  oxyCODONE-acetaminophen (PERCOCET/ROXICET) 5-325 MG tablet Take 1 tablet by mouth every 6 (six) hours as needed for severe pain. 10/31/22   Eber HongMiller, Brian, MD  predniSONE (DELTASONE) 20 MG tablet Take 2 tablets daily with breakfast. 11/27/22   Ward, Tylene FantasiaJessica Z, PA-C  promethazine-dextromethorphan (PROMETHAZINE-DM) 6.25-15 MG/5ML syrup Take 5 mLs by mouth 4 (four) times daily as needed for cough. 08/24/22   Wallis BambergMani, Mario, PA-C  pseudoephedrine (SUDAFED) 30 MG tablet Take 1 tablet (30 mg total) by mouth every 8 (eight) hours as needed for congestion. 08/24/22   Wallis BambergMani, Mario, PA-C  Saw Palmetto, Serenoa repens, (SAW PALMETTO PO) Take 1 capsule by mouth daily.    [provider]    Family History Family History  Problem Relation Age of Onset   Parkinsonism Neg Hx     Social History Social History   Tobacco Use   Smoking status: Never   Smokeless tobacco: Never  Vaping Use   Vaping  Use: Never used  Substance Use Topics   Alcohol use: No    Alcohol/week: 0.0 standard drinks of alcohol    Comment: Quit in 1981   Drug use: No     Allergies   Tequin [gatifloxacin]   Review of Systems Review of Systems  Constitutional:  Negative for chills and fever.  HENT:  Positive for congestion. Negative for ear pain and sore throat.   Eyes:  Negative for pain and visual disturbance.  Respiratory:  Positive for cough. Negative for shortness of breath.   Cardiovascular:  Negative for chest pain and palpitations.  Gastrointestinal:  Negative for abdominal pain and vomiting.  Genitourinary:  Negative for  dysuria and hematuria.  Musculoskeletal:  Negative for arthralgias and back pain.  Skin:  Negative for color change and rash.  Neurological:  Negative for seizures and syncope.  All other systems reviewed and are negative.    Physical Exam Triage Vital Signs ED Triage Vitals  Enc Vitals Group     BP 11/27/22 1213 126/84     Pulse Rate 11/27/22 1213 (!) 101     Resp 11/27/22 1213 18     Temp 11/27/22 1213 98 F (36.7 C)     Temp Source 11/27/22 1213 Oral     SpO2 11/27/22 1213 97 %     Weight --      Height --      Head Circumference --      Peak Flow --      Pain Score 11/27/22 1215 0     Pain Loc --      Pain Edu? --      Excl. in GC? --    No data found.  Updated Vital Signs BP 126/84 (BP Location: Right Arm)   Pulse (!) 101   Temp 98 F (36.7 C) (Oral)   Resp 18   SpO2 97%   Visual Acuity Right Eye Distance:   Left Eye Distance:   Bilateral Distance:    Right Eye Near:   Left Eye Near:    Bilateral Near:     Physical Exam Vitals and nursing note reviewed.  Constitutional:      General: He is not in acute distress.    Appearance: He is well-developed.  HENT:     Head: Normocephalic and atraumatic.  Eyes:     Conjunctiva/sclera: Conjunctivae normal.  Cardiovascular:     Rate and Rhythm: Normal rate and regular rhythm.     Heart sounds: No murmur heard. Pulmonary:     Effort: Pulmonary effort is normal. No respiratory distress.     Breath sounds: Normal breath sounds.  Abdominal:     Palpations: Abdomen is soft.     Tenderness: There is no abdominal tenderness.  Musculoskeletal:        General: No swelling.     Cervical back: Neck supple.  Skin:    General: Skin is warm and dry.     Capillary Refill: Capillary refill takes less than 2 seconds.  Neurological:     Mental Status: He is alert.  Psychiatric:        Mood and Affect: Mood normal.      UC Treatments / Results  Labs (all labs ordered are listed, but only abnormal results are  displayed) Labs Reviewed - No data to display  EKG   Radiology No results found.  Procedures Procedures (including critical care time)  Medications Ordered in UC Medications - No data to display  Initial Impression /  Assessment and Plan / UC Course  I have reviewed the triage vital signs and the nursing notes.  Pertinent labs & imaging results that were available during my care of the patient were reviewed by me and considered in my medical decision making (see chart for details).     URI.  Overall patient no acute distress, physical exam normal, vitals within normal limits.  Supportive care discussed.  I did prescribe patient a course of prednisone to take if he notices symptoms are worsening and becoming more of a bronchitis as this is patient's concern Final Clinical Impressions(s) / UC Diagnoses   Final diagnoses:  Acute upper respiratory infection     Discharge Instructions      Continue with Mucinex and Claritin.  Recommend Flonase URI symptoms typically resolve in 5 to 7 days.  If you develop more bronchitis type symptoms can take prednisone course. Drink plenty of fluids, rest. Can take Tessalon as needed for cough.     ED Prescriptions     Medication Sig Dispense Auth. Provider   benzonatate (TESSALON) 100 MG capsule Take 1-2 capsules (100-200 mg total) by mouth 3 (three) times daily as needed for cough. 60 capsule Ward, Tylene Fantasia, PA-C   predniSONE (DELTASONE) 20 MG tablet Take 2 tablets daily with breakfast. 10 tablet Ward, Tylene Fantasia, PA-C      PDMP not reviewed this encounter.   Ward, Tylene Fantasia, PA-C 11/27/22 1237

## 2022-11-27 NOTE — ED Triage Notes (Signed)
Pt reports he has a cough and is producing yellow phlem x 4 days. Took mucinex, inhaler, and cough relief which gave some relief.

## 2022-11-27 NOTE — Discharge Instructions (Addendum)
Continue with Mucinex and Claritin.  Recommend Flonase URI symptoms typically resolve in 5 to 7 days.  If you develop more bronchitis type symptoms can take prednisone course. Drink plenty of fluids, rest. Can take Tessalon as needed for cough.

## 2022-11-29 DIAGNOSIS — M9906 Segmental and somatic dysfunction of lower extremity: Secondary | ICD-10-CM | POA: Diagnosis not present

## 2022-11-29 DIAGNOSIS — M9902 Segmental and somatic dysfunction of thoracic region: Secondary | ICD-10-CM | POA: Diagnosis not present

## 2022-11-29 DIAGNOSIS — M25561 Pain in right knee: Secondary | ICD-10-CM | POA: Diagnosis not present

## 2022-11-29 DIAGNOSIS — M62830 Muscle spasm of back: Secondary | ICD-10-CM | POA: Diagnosis not present

## 2022-11-29 DIAGNOSIS — M9905 Segmental and somatic dysfunction of pelvic region: Secondary | ICD-10-CM | POA: Diagnosis not present

## 2022-11-29 DIAGNOSIS — M9903 Segmental and somatic dysfunction of lumbar region: Secondary | ICD-10-CM | POA: Diagnosis not present

## 2022-12-06 DIAGNOSIS — J209 Acute bronchitis, unspecified: Secondary | ICD-10-CM | POA: Diagnosis not present

## 2022-12-06 DIAGNOSIS — K810 Acute cholecystitis: Secondary | ICD-10-CM | POA: Diagnosis not present

## 2022-12-06 DIAGNOSIS — Z6823 Body mass index (BMI) 23.0-23.9, adult: Secondary | ICD-10-CM | POA: Diagnosis not present

## 2022-12-06 DIAGNOSIS — K8010 Calculus of gallbladder with chronic cholecystitis without obstruction: Secondary | ICD-10-CM | POA: Diagnosis not present

## 2022-12-06 DIAGNOSIS — K8080 Other cholelithiasis without obstruction: Secondary | ICD-10-CM | POA: Diagnosis not present

## 2023-01-24 DIAGNOSIS — M9901 Segmental and somatic dysfunction of cervical region: Secondary | ICD-10-CM | POA: Diagnosis not present

## 2023-01-24 DIAGNOSIS — M9903 Segmental and somatic dysfunction of lumbar region: Secondary | ICD-10-CM | POA: Diagnosis not present

## 2023-01-24 DIAGNOSIS — M542 Cervicalgia: Secondary | ICD-10-CM | POA: Diagnosis not present

## 2023-01-24 DIAGNOSIS — M9902 Segmental and somatic dysfunction of thoracic region: Secondary | ICD-10-CM | POA: Diagnosis not present

## 2023-01-24 DIAGNOSIS — M546 Pain in thoracic spine: Secondary | ICD-10-CM | POA: Diagnosis not present

## 2023-01-25 ENCOUNTER — Other Ambulatory Visit (HOSPITAL_COMMUNITY): Payer: Self-pay | Admitting: Family Medicine

## 2023-01-25 DIAGNOSIS — Z6823 Body mass index (BMI) 23.0-23.9, adult: Secondary | ICD-10-CM | POA: Diagnosis not present

## 2023-01-25 DIAGNOSIS — R221 Localized swelling, mass and lump, neck: Secondary | ICD-10-CM | POA: Diagnosis not present

## 2023-01-31 DIAGNOSIS — M9903 Segmental and somatic dysfunction of lumbar region: Secondary | ICD-10-CM | POA: Diagnosis not present

## 2023-01-31 DIAGNOSIS — M542 Cervicalgia: Secondary | ICD-10-CM | POA: Diagnosis not present

## 2023-01-31 DIAGNOSIS — M9901 Segmental and somatic dysfunction of cervical region: Secondary | ICD-10-CM | POA: Diagnosis not present

## 2023-01-31 DIAGNOSIS — M546 Pain in thoracic spine: Secondary | ICD-10-CM | POA: Diagnosis not present

## 2023-01-31 DIAGNOSIS — M9902 Segmental and somatic dysfunction of thoracic region: Secondary | ICD-10-CM | POA: Diagnosis not present

## 2023-02-07 DIAGNOSIS — M542 Cervicalgia: Secondary | ICD-10-CM | POA: Diagnosis not present

## 2023-02-07 DIAGNOSIS — M546 Pain in thoracic spine: Secondary | ICD-10-CM | POA: Diagnosis not present

## 2023-02-07 DIAGNOSIS — M9902 Segmental and somatic dysfunction of thoracic region: Secondary | ICD-10-CM | POA: Diagnosis not present

## 2023-02-07 DIAGNOSIS — M9901 Segmental and somatic dysfunction of cervical region: Secondary | ICD-10-CM | POA: Diagnosis not present

## 2023-02-07 DIAGNOSIS — M9903 Segmental and somatic dysfunction of lumbar region: Secondary | ICD-10-CM | POA: Diagnosis not present

## 2023-02-14 DIAGNOSIS — M546 Pain in thoracic spine: Secondary | ICD-10-CM | POA: Diagnosis not present

## 2023-02-14 DIAGNOSIS — M542 Cervicalgia: Secondary | ICD-10-CM | POA: Diagnosis not present

## 2023-02-14 DIAGNOSIS — M9901 Segmental and somatic dysfunction of cervical region: Secondary | ICD-10-CM | POA: Diagnosis not present

## 2023-02-14 DIAGNOSIS — M9902 Segmental and somatic dysfunction of thoracic region: Secondary | ICD-10-CM | POA: Diagnosis not present

## 2023-02-14 DIAGNOSIS — M9903 Segmental and somatic dysfunction of lumbar region: Secondary | ICD-10-CM | POA: Diagnosis not present

## 2023-03-11 DIAGNOSIS — M9903 Segmental and somatic dysfunction of lumbar region: Secondary | ICD-10-CM | POA: Diagnosis not present

## 2023-03-11 DIAGNOSIS — M9902 Segmental and somatic dysfunction of thoracic region: Secondary | ICD-10-CM | POA: Diagnosis not present

## 2023-03-11 DIAGNOSIS — M546 Pain in thoracic spine: Secondary | ICD-10-CM | POA: Diagnosis not present

## 2023-03-11 DIAGNOSIS — M9901 Segmental and somatic dysfunction of cervical region: Secondary | ICD-10-CM | POA: Diagnosis not present

## 2023-03-11 DIAGNOSIS — M542 Cervicalgia: Secondary | ICD-10-CM | POA: Diagnosis not present

## 2023-04-20 DIAGNOSIS — R1310 Dysphagia, unspecified: Secondary | ICD-10-CM | POA: Diagnosis not present

## 2023-04-20 DIAGNOSIS — R221 Localized swelling, mass and lump, neck: Secondary | ICD-10-CM | POA: Diagnosis not present

## 2023-04-21 ENCOUNTER — Other Ambulatory Visit: Payer: Self-pay | Admitting: Otolaryngology

## 2023-04-21 DIAGNOSIS — R221 Localized swelling, mass and lump, neck: Secondary | ICD-10-CM

## 2023-04-27 ENCOUNTER — Ambulatory Visit
Admission: RE | Admit: 2023-04-27 | Discharge: 2023-04-27 | Disposition: A | Discharge status: Home / Self Care | Payer: 59 | Source: Ambulatory Visit | Attending: Otolaryngology | Admitting: Otolaryngology

## 2023-04-27 DIAGNOSIS — R221 Localized swelling, mass and lump, neck: Secondary | ICD-10-CM

## 2023-04-27 MED ORDER — IOPAMIDOL (ISOVUE-300) INJECTION 61%
75.0000 mL | Freq: Once | INTRAVENOUS | Status: AC | PRN
  Administered 2023-04-27: 75 mL via INTRAVENOUS

## 2023-05-26 ENCOUNTER — Emergency Department (HOSPITAL_COMMUNITY)
Admission: EM | Admit: 2023-05-26 | Discharge: 2023-05-27 | Disposition: A | Discharge status: Home / Self Care | Payer: Medicare Other | Attending: Emergency Medicine | Admitting: Emergency Medicine

## 2023-05-26 ENCOUNTER — Emergency Department (HOSPITAL_COMMUNITY): Payer: Medicare Other

## 2023-05-26 ENCOUNTER — Encounter (HOSPITAL_COMMUNITY): Payer: Self-pay

## 2023-05-26 ENCOUNTER — Other Ambulatory Visit: Payer: Self-pay

## 2023-05-26 DIAGNOSIS — Z7952 Long term (current) use of systemic steroids: Secondary | ICD-10-CM | POA: Diagnosis not present

## 2023-05-26 DIAGNOSIS — I25110 Atherosclerotic heart disease of native coronary artery with unstable angina pectoris: Secondary | ICD-10-CM | POA: Diagnosis not present

## 2023-05-26 DIAGNOSIS — Z79899 Other long term (current) drug therapy: Secondary | ICD-10-CM | POA: Diagnosis not present

## 2023-05-26 DIAGNOSIS — E119 Type 2 diabetes mellitus without complications: Secondary | ICD-10-CM | POA: Diagnosis not present

## 2023-05-26 DIAGNOSIS — E785 Hyperlipidemia, unspecified: Secondary | ICD-10-CM | POA: Diagnosis not present

## 2023-05-26 DIAGNOSIS — R079 Chest pain, unspecified: Secondary | ICD-10-CM | POA: Diagnosis present

## 2023-05-26 DIAGNOSIS — I2111 ST elevation (STEMI) myocardial infarction involving right coronary artery: Secondary | ICD-10-CM | POA: Diagnosis not present

## 2023-05-26 DIAGNOSIS — E876 Hypokalemia: Secondary | ICD-10-CM | POA: Diagnosis not present

## 2023-05-26 DIAGNOSIS — I2119 ST elevation (STEMI) myocardial infarction involving other coronary artery of inferior wall: Secondary | ICD-10-CM | POA: Diagnosis not present

## 2023-05-26 DIAGNOSIS — I213 ST elevation (STEMI) myocardial infarction of unspecified site: Secondary | ICD-10-CM | POA: Diagnosis not present

## 2023-05-26 DIAGNOSIS — Z7982 Long term (current) use of aspirin: Secondary | ICD-10-CM | POA: Diagnosis not present

## 2023-05-26 LAB — CBC
HCT: 42.1 % (ref 39.0–52.0)
Hemoglobin: 13.9 g/dL (ref 13.0–17.0)
MCH: 29.5 pg (ref 26.0–34.0)
MCHC: 33 g/dL (ref 30.0–36.0)
MCV: 89.4 fL (ref 80.0–100.0)
Platelets: 233 10*3/uL (ref 150–400)
RBC: 4.71 MIL/uL (ref 4.22–5.81)
RDW: 13.2 % (ref 11.5–15.5)
WBC: 5.3 10*3/uL (ref 4.0–10.5)
nRBC: 0 % (ref 0.0–0.2)

## 2023-05-26 LAB — BASIC METABOLIC PANEL
Anion gap: 10 (ref 5–15)
BUN: 21 mg/dL (ref 8–23)
CO2: 25 mmol/L (ref 22–32)
Calcium: 9.2 mg/dL (ref 8.9–10.3)
Chloride: 100 mmol/L (ref 98–111)
Creatinine, Ser: 1.21 mg/dL (ref 0.61–1.24)
GFR, Estimated: >60 mL/min (ref >60)
Glucose, Bld: 172 mg/dL — ABNORMAL HIGH (ref 70–99)
Potassium: 3.4 mmol/L — ABNORMAL LOW (ref 3.5–5.1)
Sodium: 135 mmol/L (ref 135–145)

## 2023-05-26 LAB — TROPONIN I (HIGH SENSITIVITY): Troponin I (High Sensitivity): 3 ng/L (ref <18)

## 2023-05-26 LAB — LAB REPORT - SCANNED: A1c: 5.6

## 2023-05-26 NOTE — ED Triage Notes (Signed)
C/o centralized cp radiating to left arm, frequent belching, and diaphoresis that started after dinner.

## 2023-05-27 ENCOUNTER — Other Ambulatory Visit: Payer: Self-pay

## 2023-05-27 ENCOUNTER — Inpatient Hospital Stay (HOSPITAL_COMMUNITY)
Admission: EM | Admit: 2023-05-27 | Discharge: 2023-05-31 | DRG: 322 | Disposition: A | Discharge status: Home / Self Care | Payer: Medicare Other | Attending: Cardiology | Admitting: Cardiology

## 2023-05-27 ENCOUNTER — Encounter (HOSPITAL_COMMUNITY): Payer: Self-pay | Admitting: Emergency Medicine

## 2023-05-27 ENCOUNTER — Inpatient Hospital Stay (HOSPITAL_COMMUNITY)
Admission: EM | Disposition: A | Discharge status: Home / Self Care | Payer: Self-pay | Source: Home / Self Care | Attending: Cardiovascular Disease

## 2023-05-27 ENCOUNTER — Other Ambulatory Visit (HOSPITAL_COMMUNITY): Payer: Self-pay

## 2023-05-27 ENCOUNTER — Inpatient Hospital Stay (HOSPITAL_COMMUNITY): Payer: Medicare Other

## 2023-05-27 DIAGNOSIS — Z7982 Long term (current) use of aspirin: Secondary | ICD-10-CM | POA: Diagnosis not present

## 2023-05-27 DIAGNOSIS — R079 Chest pain, unspecified: Secondary | ICD-10-CM | POA: Diagnosis not present

## 2023-05-27 DIAGNOSIS — Z955 Presence of coronary angioplasty implant and graft: Secondary | ICD-10-CM | POA: Diagnosis not present

## 2023-05-27 DIAGNOSIS — I2119 ST elevation (STEMI) myocardial infarction involving other coronary artery of inferior wall: Secondary | ICD-10-CM

## 2023-05-27 DIAGNOSIS — I213 ST elevation (STEMI) myocardial infarction of unspecified site: Secondary | ICD-10-CM | POA: Diagnosis not present

## 2023-05-27 DIAGNOSIS — I25110 Atherosclerotic heart disease of native coronary artery with unstable angina pectoris: Secondary | ICD-10-CM | POA: Diagnosis present

## 2023-05-27 DIAGNOSIS — I2111 ST elevation (STEMI) myocardial infarction involving right coronary artery: Secondary | ICD-10-CM | POA: Diagnosis not present

## 2023-05-27 DIAGNOSIS — Z7952 Long term (current) use of systemic steroids: Secondary | ICD-10-CM | POA: Diagnosis not present

## 2023-05-27 DIAGNOSIS — Z743 Need for continuous supervision: Secondary | ICD-10-CM | POA: Diagnosis not present

## 2023-05-27 DIAGNOSIS — I499 Cardiac arrhythmia, unspecified: Secondary | ICD-10-CM | POA: Diagnosis not present

## 2023-05-27 DIAGNOSIS — I2510 Atherosclerotic heart disease of native coronary artery without angina pectoris: Secondary | ICD-10-CM | POA: Diagnosis not present

## 2023-05-27 DIAGNOSIS — Z79899 Other long term (current) drug therapy: Secondary | ICD-10-CM | POA: Diagnosis not present

## 2023-05-27 DIAGNOSIS — E119 Type 2 diabetes mellitus without complications: Secondary | ICD-10-CM | POA: Diagnosis present

## 2023-05-27 DIAGNOSIS — E7800 Pure hypercholesterolemia, unspecified: Secondary | ICD-10-CM | POA: Diagnosis present

## 2023-05-27 DIAGNOSIS — E785 Hyperlipidemia, unspecified: Secondary | ICD-10-CM | POA: Diagnosis present

## 2023-05-27 DIAGNOSIS — R61 Generalized hyperhidrosis: Secondary | ICD-10-CM | POA: Diagnosis not present

## 2023-05-27 DIAGNOSIS — I10 Essential (primary) hypertension: Secondary | ICD-10-CM | POA: Diagnosis present

## 2023-05-27 DIAGNOSIS — E876 Hypokalemia: Secondary | ICD-10-CM | POA: Diagnosis not present

## 2023-05-27 HISTORY — PX: CORONARY/GRAFT ACUTE MI REVASCULARIZATION: CATH118305

## 2023-05-27 HISTORY — PX: LEFT HEART CATH AND CORONARY ANGIOGRAPHY: CATH118249

## 2023-05-27 LAB — POCT ACTIVATED CLOTTING TIME
Activated Clotting Time: 257 s
Activated Clotting Time: 409 s
Activated Clotting Time: 177 s
Activated Clotting Time: 128 s

## 2023-05-27 LAB — COMPREHENSIVE METABOLIC PANEL
ALT: 41 U/L (ref 0–44)
AST: 36 U/L (ref 15–41)
Albumin: 4.1 g/dL (ref 3.5–5.0)
Alkaline Phosphatase: 61 U/L (ref 38–126)
Anion gap: 13 (ref 5–15)
BUN: 18 mg/dL (ref 8–23)
CO2: 22 mmol/L (ref 22–32)
Calcium: 8.9 mg/dL (ref 8.9–10.3)
Chloride: 102 mmol/L (ref 98–111)
Creatinine, Ser: 0.99 mg/dL (ref 0.61–1.24)
GFR, Estimated: >60 mL/min (ref >60)
Glucose, Bld: 165 mg/dL — ABNORMAL HIGH (ref 70–99)
Potassium: 3.1 mmol/L — ABNORMAL LOW (ref 3.5–5.1)
Sodium: 137 mmol/L (ref 135–145)
Total Bilirubin: 0.4 mg/dL (ref 0.3–1.2)
Total Protein: 6.6 g/dL (ref 6.5–8.1)

## 2023-05-27 LAB — CBC
HCT: 43.3 % (ref 39.0–52.0)
Hemoglobin: 14.2 g/dL (ref 13.0–17.0)
MCH: 29.4 pg (ref 26.0–34.0)
MCHC: 32.8 g/dL (ref 30.0–36.0)
MCV: 89.6 fL (ref 80.0–100.0)
Platelets: 287 10*3/uL (ref 150–400)
RBC: 4.83 MIL/uL (ref 4.22–5.81)
RDW: 13.2 % (ref 11.5–15.5)
WBC: 10.4 10*3/uL (ref 4.0–10.5)
nRBC: 0 % (ref 0.0–0.2)

## 2023-05-27 LAB — MRSA NEXT GEN BY PCR, NASAL: MRSA by PCR Next Gen: NOT DETECTED

## 2023-05-27 LAB — LIPID PANEL
Cholesterol: 351 mg/dL — ABNORMAL HIGH (ref 0–200)
HDL: 58 mg/dL (ref >40)
LDL Cholesterol: 231 mg/dL — ABNORMAL HIGH (ref 0–99)
Total CHOL/HDL Ratio: 6.1 {ratio}
Triglycerides: 312 mg/dL — ABNORMAL HIGH (ref <150)
VLDL: 62 mg/dL — ABNORMAL HIGH (ref 0–40)

## 2023-05-27 LAB — ECHOCARDIOGRAM COMPLETE
AR max vel: 2.34 cm2
AV Area VTI: 2.58 cm2
AV Area mean vel: 2.45 cm2
AV Mean grad: 2 mm[Hg]
AV Peak grad: 4.5 mm[Hg]
Ao pk vel: 1.06 m/s
Area-P 1/2: 3.23 cm2
Height: 66.5 in
S' Lateral: 2.6 cm
Weight: 2320 [oz_av]

## 2023-05-27 LAB — GLUCOSE, CAPILLARY: Glucose-Capillary: 150 mg/dL — ABNORMAL HIGH (ref 70–99)

## 2023-05-27 LAB — HEMOGLOBIN A1C
Hgb A1c MFr Bld: 5.8 % — ABNORMAL HIGH (ref 4.8–5.6)
Mean Plasma Glucose: 119.76 mg/dL

## 2023-05-27 LAB — CG4 I-STAT (LACTIC ACID): Lactic Acid, Venous: 0.8 mmol/L (ref 0.5–1.9)

## 2023-05-27 LAB — TROPONIN I (HIGH SENSITIVITY)
Troponin I (High Sensitivity): 6 ng/L (ref <18)
Troponin I (High Sensitivity): 9 ng/L (ref <18)
Troponin I (High Sensitivity): >24000 ng/L (ref <18)

## 2023-05-27 LAB — APTT: aPTT: 21 s — ABNORMAL LOW (ref 24–36)

## 2023-05-27 LAB — PROTIME-INR
INR: 0.8 (ref 0.8–1.2)
Prothrombin Time: 11.7 s (ref 11.4–15.2)

## 2023-05-27 LAB — LIPASE, BLOOD: Lipase: 34 U/L (ref 11–51)

## 2023-05-27 SURGERY — CORONARY/GRAFT ACUTE MI REVASCULARIZATION
Anesthesia: LOCAL

## 2023-05-27 MED ORDER — SODIUM CHLORIDE 0.9 % IV SOLN: INTRAVENOUS | Status: AC

## 2023-05-27 MED ORDER — ACETAMINOPHEN 325 MG PO TABS
650.0000 mg | ORAL_TABLET | ORAL | Status: DC | PRN
  Administered 2023-05-27 (×2): 650 mg via ORAL
  Filled 2023-05-27 (×2): qty 2

## 2023-05-27 MED ORDER — SODIUM CHLORIDE 0.9 % IV BOLUS
1000.0000 mL | Freq: Once | INTRAVENOUS | Status: AC
  Administered 2023-05-27: 1000 mL via INTRAVENOUS

## 2023-05-27 MED ORDER — ASPIRIN 81 MG PO CHEW
324.0000 mg | CHEWABLE_TABLET | Freq: Once | ORAL | Status: AC
  Administered 2023-05-27: 324 mg via ORAL
  Filled 2023-05-27: qty 4

## 2023-05-27 MED ORDER — SODIUM CHLORIDE 0.9 % IV SOLN: 250.0000 mL | INTRAVENOUS | Status: DC | PRN

## 2023-05-27 MED ORDER — ONDANSETRON HCL 4 MG/2ML IJ SOLN
4.0000 mg | Freq: Once | INTRAMUSCULAR | Status: AC
  Administered 2023-05-27: 4 mg via INTRAVENOUS
  Filled 2023-05-27: qty 2

## 2023-05-27 MED ORDER — TIROFIBAN HCL IN NACL 5-0.9 MG/100ML-% IV SOLN: 0.1500 ug/kg/min | INTRAVENOUS | Status: AC

## 2023-05-27 MED ORDER — MIDAZOLAM HCL 2 MG/2ML IJ SOLN
INTRAMUSCULAR | Status: DC | PRN
  Administered 2023-05-27: 1 mg via INTRAVENOUS

## 2023-05-27 MED ORDER — IOHEXOL 350 MG/ML SOLN
INTRAVENOUS | Status: DC | PRN
  Administered 2023-05-27: 80 mL via INTRA_ARTERIAL

## 2023-05-27 MED ORDER — TICAGRELOR 90 MG PO TABS
90.0000 mg | ORAL_TABLET | Freq: Two times a day (BID) | ORAL | Status: DC
  Administered 2023-05-27 – 2023-05-30 (×6): 90 mg via ORAL
  Filled 2023-05-27 (×7): qty 1

## 2023-05-27 MED ORDER — HEPARIN SODIUM (PORCINE) 1000 UNIT/ML IJ SOLN
INTRAMUSCULAR | Status: AC
  Filled 2023-05-27: qty 10

## 2023-05-27 MED ORDER — TICAGRELOR 90 MG PO TABS
ORAL_TABLET | ORAL | Status: AC
  Filled 2023-05-27: qty 2

## 2023-05-27 MED ORDER — VERAPAMIL HCL 2.5 MG/ML IV SOLN
INTRAVENOUS | Status: AC
  Filled 2023-05-27: qty 2

## 2023-05-27 MED ORDER — TIROFIBAN HCL IN NACL 5-0.9 MG/100ML-% IV SOLN
INTRAVENOUS | Status: AC | PRN
  Administered 2023-05-27: .15 ug/kg/min via INTRAVENOUS

## 2023-05-27 MED ORDER — LABETALOL HCL 5 MG/ML IV SOLN: 10.0000 mg | INTRAVENOUS | Status: AC | PRN

## 2023-05-27 MED ORDER — HEPARIN SODIUM (PORCINE) 5000 UNIT/ML IJ SOLN
60.0000 [IU]/kg | Freq: Once | INTRAMUSCULAR | Status: AC
  Administered 2023-05-27: 3950 [IU] via INTRAVENOUS
  Filled 2023-05-27: qty 1

## 2023-05-27 MED ORDER — FUROSEMIDE 10 MG/ML IJ SOLN
40.0000 mg | Freq: Once | INTRAMUSCULAR | Status: AC
  Administered 2023-05-27: 40 mg via INTRAVENOUS
  Filled 2023-05-27: qty 4

## 2023-05-27 MED ORDER — HYDRALAZINE HCL 20 MG/ML IJ SOLN: 10.0000 mg | INTRAMUSCULAR | Status: AC | PRN

## 2023-05-27 MED ORDER — HEPARIN SODIUM (PORCINE) 1000 UNIT/ML IJ SOLN
INTRAMUSCULAR | Status: DC | PRN
  Administered 2023-05-27: 3000 [IU] via INTRAVENOUS
  Administered 2023-05-27: 8000 [IU] via INTRAVENOUS

## 2023-05-27 MED ORDER — SODIUM CHLORIDE 0.9 % IV BOLUS
250.0000 mL | INTRAVENOUS | Status: AC
  Administered 2023-05-27: 250 mL via INTRAVENOUS

## 2023-05-27 MED ORDER — FENTANYL CITRATE (PF) 100 MCG/2ML IJ SOLN
INTRAMUSCULAR | Status: DC | PRN
  Administered 2023-05-27: 25 ug via INTRAVENOUS

## 2023-05-27 MED ORDER — ATORVASTATIN CALCIUM 80 MG PO TABS
80.0000 mg | ORAL_TABLET | Freq: Every day | ORAL | Status: DC
  Administered 2023-05-27 – 2023-05-30 (×4): 80 mg via ORAL
  Filled 2023-05-27 (×4): qty 1

## 2023-05-27 MED ORDER — PERFLUTREN LIPID MICROSPHERE
1.0000 mL | INTRAVENOUS | Status: AC | PRN
  Administered 2023-05-27: 2 mL via INTRAVENOUS

## 2023-05-27 MED ORDER — VERAPAMIL HCL 2.5 MG/ML IV SOLN
INTRAVENOUS | Status: DC | PRN
  Administered 2023-05-27: 10 mL via INTRA_ARTERIAL

## 2023-05-27 MED ORDER — FENTANYL CITRATE (PF) 100 MCG/2ML IJ SOLN
INTRAMUSCULAR | Status: AC
  Filled 2023-05-27: qty 2

## 2023-05-27 MED ORDER — ASPIRIN 81 MG PO CHEW
81.0000 mg | CHEWABLE_TABLET | Freq: Every day | ORAL | Status: DC
  Administered 2023-05-28 – 2023-05-30 (×3): 81 mg via ORAL
  Filled 2023-05-27 (×4): qty 1

## 2023-05-27 MED ORDER — OMEPRAZOLE 20 MG PO CPDR: 20.0000 mg | DELAYED_RELEASE_CAPSULE | Freq: Every day | ORAL | 1 refills | Status: DC

## 2023-05-27 MED ORDER — TIROFIBAN (AGGRASTAT) BOLUS VIA INFUSION
INTRAVENOUS | Status: DC | PRN
  Administered 2023-05-27: 1645 ug via INTRAVENOUS

## 2023-05-27 MED ORDER — CHLORHEXIDINE GLUCONATE CLOTH 2 % EX PADS
6.0000 | MEDICATED_PAD | Freq: Every day | CUTANEOUS | Status: DC
  Administered 2023-05-27 – 2023-05-29 (×3): 6 via TOPICAL

## 2023-05-27 MED ORDER — POTASSIUM CHLORIDE CRYS ER 20 MEQ PO TBCR
40.0000 meq | EXTENDED_RELEASE_TABLET | ORAL | Status: AC
  Administered 2023-05-27 (×2): 40 meq via ORAL
  Filled 2023-05-27 (×2): qty 2

## 2023-05-27 MED ORDER — NITROGLYCERIN 1 MG/10 ML FOR IR/CATH LAB
INTRA_ARTERIAL | Status: AC
  Filled 2023-05-27: qty 10

## 2023-05-27 MED ORDER — TICAGRELOR 90 MG PO TABS
ORAL_TABLET | ORAL | Status: DC | PRN
  Administered 2023-05-27: 180 mg via ORAL

## 2023-05-27 MED ORDER — ONDANSETRON HCL 4 MG/2ML IJ SOLN
4.0000 mg | Freq: Four times a day (QID) | INTRAMUSCULAR | Status: DC | PRN
  Administered 2023-05-27 – 2023-05-29 (×2): 4 mg via INTRAVENOUS
  Filled 2023-05-27 (×2): qty 2

## 2023-05-27 MED ORDER — SODIUM CHLORIDE 0.9% FLUSH: 3.0000 mL | INTRAVENOUS | Status: DC | PRN

## 2023-05-27 MED ORDER — TIROFIBAN HCL IN NACL 5-0.9 MG/100ML-% IV SOLN
INTRAVENOUS | Status: AC
  Filled 2023-05-27: qty 100

## 2023-05-27 MED ORDER — SODIUM CHLORIDE 0.9% FLUSH
3.0000 mL | Freq: Two times a day (BID) | INTRAVENOUS | Status: DC
  Administered 2023-05-27 – 2023-05-29 (×5): 3 mL via INTRAVENOUS

## 2023-05-27 MED ORDER — MIDAZOLAM HCL 2 MG/2ML IJ SOLN
INTRAMUSCULAR | Status: AC
  Filled 2023-05-27: qty 2

## 2023-05-27 MED ORDER — OXYCODONE HCL 5 MG PO TABS: 5.0000 mg | ORAL_TABLET | ORAL | Status: DC | PRN

## 2023-05-27 MED ORDER — LIDOCAINE HCL (PF) 1 % IJ SOLN
INTRAMUSCULAR | Status: DC | PRN
  Administered 2023-05-27: 12 mL
  Administered 2023-05-27: 2 mL

## 2023-05-27 MED ORDER — HYDROMORPHONE HCL 1 MG/ML IJ SOLN
1.0000 mg | Freq: Once | INTRAMUSCULAR | Status: AC
  Administered 2023-05-27: 1 mg via INTRAVENOUS
  Filled 2023-05-27: qty 1

## 2023-05-27 MED ORDER — ATROPINE SULFATE 1 MG/10ML IJ SOSY
PREFILLED_SYRINGE | INTRAMUSCULAR | Status: AC
  Filled 2023-05-27: qty 10

## 2023-05-27 MED ORDER — LIDOCAINE HCL (PF) 1 % IJ SOLN
INTRAMUSCULAR | Status: AC
  Filled 2023-05-27: qty 30

## 2023-05-27 MED ORDER — ORAL CARE MOUTH RINSE: 15.0000 mL | OROMUCOSAL | Status: DC | PRN

## 2023-05-27 SURGICAL SUPPLY — 23 items
BALLN EMERGE MR 2.0X12 (BALLOONS) ×1
BALLN ~~LOC~~ EMERGE MR 2.75X12 (BALLOONS) ×1
BALLOON EMERGE MR 2.0X12 (BALLOONS) IMPLANT
BALLOON ~~LOC~~ EMERGE MR 2.75X12 (BALLOONS) IMPLANT
CATH 5FR JL3.5 JR4 ANG PIG MP (CATHETERS) IMPLANT
CATH 5FR JL4 DIAGNOSTIC (CATHETERS) IMPLANT
CATH VISTA GUIDE 6FR JR4 (CATHETERS) IMPLANT
DEVICE RAD COMP TR BAND LRG (VASCULAR PRODUCTS) IMPLANT
GLIDESHEATH SLEND SS 6F .021 (SHEATH) IMPLANT
GUIDEWIRE INQWIRE 1.5J.035X260 (WIRE) IMPLANT
INQWIRE 1.5J .035X260CM (WIRE) ×1
KIT ENCORE 26 ADVANTAGE (KITS) IMPLANT
KIT MICROPUNCTURE NIT STIFF (SHEATH) IMPLANT
KIT SYRINGE INJ CVI SPIKEX1 (MISCELLANEOUS) IMPLANT
PACK CARDIAC CATHETERIZATION (CUSTOM PROCEDURE TRAY) ×1 IMPLANT
SET ATX-X65L (MISCELLANEOUS) IMPLANT
SHEATH PINNACLE 6F 10CM (SHEATH) IMPLANT
SHEATH PROBE COVER 6X72 (BAG) IMPLANT
STENT SYNERGY XD 2.50X16 (Permanent Stent) IMPLANT
SYNERGY XD 2.50X16 (Permanent Stent) ×1 IMPLANT
WIRE ASAHI PROWATER 180CM (WIRE) IMPLANT
WIRE EMERALD 3MM-J .035X150CM (WIRE) IMPLANT
WIRE HI TORQ VERSACORE-J 145CM (WIRE) IMPLANT

## 2023-05-27 NOTE — ED Triage Notes (Signed)
Pt c/o left sided chest pain with left arm pain, nausea, diaphoretic x ago, pt was seen for chest pain last night

## 2023-05-27 NOTE — Progress Notes (Signed)
CARDIAC REHAB PHASE I   Pt on bedrest, feeling poorly from meds, sheath pull. Discussed with pt and wife MI, stent, restrictions, need for Effient and statin, diet (encouraged plant based if he is resistant to statin), and CRPII. Pt voiced understanding. Wife with appropriate questions. Voices that he does not like taking meds at all. Encouraged them to continue to discuss with providers. Will refer to [facility] CRPII. Will f/u tomorrow for ambulation. [phone]  Ethelda Chick BS, ACSM-CEP 05/27/2023 2:11 PM

## 2023-05-27 NOTE — Discharge Instructions (Signed)
You were evaluated in the Emergency Department and after careful evaluation, we did not find any emergent condition requiring admission or further testing in the hospital.  Your exam/testing today is overall reassuring.  No signs of heart damage.  Still we recommend follow-up with the cardiology team to discuss need for outpatient stress testing.  Symptoms may be stomach acid related.  Can take the omeprazole prescription daily to prevent pain.  Please return to the Emergency Department if you experience any worsening of your condition.   Thank you for allowing [place] to be a part of your care.

## 2023-05-27 NOTE — Progress Notes (Signed)
Rounding Note    Patient Name: Juan Schultz Date of Encounter: 05/27/2023  [facility] Cardiologist: Dr. Earney Hamburg  Subjective   Postop day 0 inferior STEMI treatment PCI and stenting of the distal RCA.  He has residual LAD disease plan for staged PCI on Monday.  He is asymptomatic.  Inpatient Medications    Scheduled Meds:  [START ON 05/28/2023] aspirin  81 mg Oral Daily   atorvastatin  80 mg Oral Daily   Chlorhexidine Gluconate Cloth  6 each Topical Daily   nitroGLYCERIN       sodium chloride flush  3 mL Intravenous Q12H   ticagrelor  90 mg Oral BID   Continuous Infusions:  sodium chloride 50 mL/hr at 05/27/23 0700   sodium chloride     tirofiban 0.15 mcg/kg/min (05/27/23 0700)   PRN Meds: sodium chloride, acetaminophen, hydrALAZINE, labetalol, nitroGLYCERIN, ondansetron (ZOFRAN) IV, oxyCODONE, sodium chloride flush   Vital Signs    Vitals:   05/27/23 0511 05/27/23 0616 05/27/23 0700 05/27/23 0742  BP:  102/75    Pulse:   75   Resp:  15 13   Temp:    (!) 97.2 F (36.2 C)  TempSrc:    Axillary  SpO2: 97%  98%   Weight:      Height:       No intake or output data in the 24 hours ending 05/27/23 0826    05/27/2023    4:08 AM 05/26/2023   10:39 PM 10/31/2022    1:36 AM  Last 3 Weights  Weight (lbs) 145 lb 145 lb 145 lb  Weight (kg) 65.772 kg 65.772 kg 65.772 kg      Telemetry    Sinus rhythm- Personally Reviewed  ECG    Sinus rhythm at 79 without ST or T wave changes.- Personally Reviewed  Physical Exam   GEN: No acute distress.   Neck: No JVD Cardiac: RRR, no murmurs, rubs, or gallops.  Respiratory: Clear to auscultation bilaterally. GI: Soft, nontender, non-distended  MS: No edema; No deformity. Neuro:  Nonfocal  Psych: Normal affect   Labs    High Sensitivity Troponin:   Recent Labs  Lab 05/26/23 2251 05/27/23 0045 05/27/23 0428  TROPONINIHS 3 6 9      Chemistry Recent Labs  Lab 05/26/23 2251 05/27/23 0428  NA  135 137  K 3.4* 3.1*  CL 100 102  CO2 25 22  GLUCOSE 172* 165*  BUN 21 18  CREATININE 1.21 0.99  CALCIUM 9.2 8.9  PROT  --  6.6  ALBUMIN  --  4.1  AST  --  36  ALT  --  41  ALKPHOS  --  61  BILITOT  --  0.4  GFRNONAA >60 >60  ANIONGAP 10 13    Lipids  Recent Labs  Lab 05/27/23 0428  CHOL 351*  TRIG 312*  HDL 58  LDLCALC 231*  CHOLHDL 6.1    Hematology Recent Labs  Lab 05/26/23 2251 05/27/23 0428  WBC 5.3 10.4  RBC 4.71 4.83  HGB 13.9 14.2  HCT 42.1 43.3  MCV 89.4 89.6  MCH 29.5 29.4  MCHC 33.0 32.8  RDW 13.2 13.2  PLT 233 287   Thyroid No results for input(s): "TSH", "FREET4" in the last 168 hours.  BNPNo results for input(s): "BNP", "PROBNP" in the last 168 hours.  DDimer No results for input(s): "DDIMER" in the last 168 hours.   Radiology    CARDIAC CATHETERIZATION  Result Date: 05/27/2023  Prox RCA to Mid RCA lesion is 20% stenosed.   Dist RCA lesion is 99% stenosed.   Ost Cx to Prox Cx lesion is 40% stenosed.   1st Mrg lesion is 50% stenosed.   Prox LAD lesion is 80% stenosed.   A drug-eluting stent was successfully placed using a SYNERGY XD 2.50X16.   Post intervention, there is a 0% residual stenosis. Acute inferior STEMI secondary to severe distal RCA stenosis Successful PTCA/DES x 1 distal RCA Severe proximal LAD stenosis Moderate stenosis in the proximal Circumflex and obtuse marginal branch LVEDP=25 mmHg Recommendations: Will admit to the ICU. Aggrastat infusion for 2 hours. Lasix 40 mg IV x 1 in ICU. DAPT with ASA/Brilinta for one year. High intensity statin. Echo later today. Will need staged PCI of the proximal LAD next week.   DG Chest 2 View  Result Date: 05/26/2023 CLINICAL DATA:  Chest pain EXAM: CHEST - 2 VIEW COMPARISON:  10/31/2022 FINDINGS: The heart size and mediastinal contours are within normal limits. Both lungs are clear. The visualized skeletal structures are unremarkable. IMPRESSION: No active cardiopulmonary disease. Electronically  Signed   By: Jasmine Pang M.D.   On: 05/26/2023 23:15    Cardiac Studies   Cardiac catheterization/PCI and stent (05/27/23)  Conclusion      Prox RCA to Mid RCA lesion is 20% stenosed.   Dist RCA lesion is 99% stenosed.   Ost Cx to Prox Cx lesion is 40% stenosed.   1st Mrg lesion is 50% stenosed.   Prox LAD lesion is 80% stenosed.   A drug-eluting stent was successfully placed using a SYNERGY XD 2.50X16.   Post intervention, there is a 0% residual stenosis.   Acute inferior STEMI secondary to severe distal RCA stenosis Successful PTCA/DES x 1 distal RCA Severe proximal LAD stenosis Moderate stenosis in the proximal Circumflex and obtuse marginal branch LVEDP=25 mmHg   Recommendations: Will admit to the ICU. Aggrastat infusion for 2 hours. Lasix 40 mg IV x 1 in ICU. DAPT with ASA/Brilinta for one year. High intensity statin. Echo later today. Will need staged PCI of the proximal LAD next week.   Diagrams  Diagnostic Dominance: Right  Intervention      Patient Profile     67 y.o. Juan Schultz Caucasian male who is a refill without prior cardiac history or treatable risk factors other than family history who presented to [facility] chest pain last night, had a negative workup and was sent home.  He Juan Schultz presented with recurrent chest pain and) and elevation was brought to the Cath Lab by [name].  Assessment & Plan    1: Inferior STEMI-status post RCA intervention with a 2.5 x 16 mm long Synergy drug-eluting stent postdilated 2.75 mm.  He was on Aggrastat infusion.  He is on DAPT with aspirin and Brilinta with plans to transition to Effient as an outpatient.  He has 80% proximal LAD lesion with plans for staged PCI early Monday morning.  2: Hyperlipidemia-total cholesterol 351 with an LDL of 231 not on statin therapy.  Most start high-dose statin therapy.  May ultimately require a PCSK9.  Will keep in unit 2H today with tentative transition to telemetry tomorrow  morning.  Also plans are for staged LAD PCI Monday morning potential discharge later that afternoon.  For questions or updates, please contact [facility] Please consult [email or web address] for contact info under        Signed, Nanetta Batty, MD  05/27/2023, 8:26 AM

## 2023-05-27 NOTE — ED Provider Notes (Signed)
[facility] Emergency Department Provider Note MRN:  [id number]  Arrival date & time: 05/27/23     Chief Complaint   Chest Pain   History of Present Illness   Juan Schultz is a 67 y.o. year-old male with no pertinent past medical history presenting to the ED with chief complaint of chest pain.  Sudden return of chest pain.  Was here earlier in the evening with similar chest pain, discharged home.  The pain is the same in description but more severe.  Left side of the chest with radiation down the arm.  Associated with diaphoresis.  Review of Systems  A thorough review of systems was obtained and all systems are negative except as noted in the HPI and PMH.   Patient's Health History    Past Medical History:  Diagnosis Date   Seasonal allergies     Past Surgical History:  Procedure Laterality Date   COLONOSCOPY N/A 03/14/2015   Procedure: COLONOSCOPY;  Surgeon: West Bali, MD;  Location[facility];  Service: Endoscopy;  Laterality: N/A;  8:30 AM   HERNIA REPAIR  08/24/2011   KNEE ARTHROSCOPY Right 08/23/2000    Family History  Problem Relation Age of Onset   Parkinsonism Neg Hx     Social History   Socioeconomic History   Marital status: Married    Spouse name: Clydie Braun   Number of children: 2   Years of education: College   Highest education level: Not on file  Occupational History   Occupation: Programme researcher, broadcasting/film/video  Tobacco Use   Smoking status: Never   Smokeless tobacco: Never  Vaping Use   Vaping status: Never Used  Substance and Sexual Activity   Alcohol use: No    Alcohol/week: 0.0 standard drinks of alcohol    Comment: Quit in 1981   Drug use: No   Sexual activity: Yes  Other Topics Concern   Not on file  Social History Narrative   Lives at home with wife and son.   Caffeine use: 1 cups coffee per day   Drinks tea occass   Social Determinants of Health   Financial Resource Strain: Not on file  Food Insecurity: Low  Risk  (04/20/2023)   Received from [facility]   Hunger Vital Sign    Worried About Running Out of Food in the Last Year: Never true    Ran Out of Food in the Last Year: Never true  Transportation Needs: No Transportation Needs (04/20/2023)   Received from [facility]    In the past 12 months, has lack of reliable transportation kept you from medical appointments, meetings, work or from getting things needed for daily living? : No  Physical Activity: Not on file  Stress: Not on file  Social Connections: Not on file  Intimate Partner Violence: Unknown (10/10/2022)   [facility]   Domestic Violence Screen    Physical Abuse Risk: Unknown    Verbal Abuse Risk: Unknown     Physical Exam   Vitals:   05/27/23 0415 05/27/23 0426  BP: (!) 114/90   Pulse: 60   Resp: 15   Temp:  (!) 97.4 F (36.3 C)  SpO2: 96%     CONSTITUTIONAL: Ill-appearing, appears a bit pale, uncomfortable NEURO/PSYCH:  Alert and oriented x 3, no focal deficits EYES:  eyes equal and reactive ENT/NECK:  no LAD, no JVD CARDIO: Regular rate, well-perfused, normal S1 and S2 PULM:  CTAB no wheezing or rhonchi GI/GU:  non-distended, non-tender MSK/SPINE:  No gross deformities, no edema SKIN:  no rash, atraumatic   *Additional and/or pertinent findings included in MDM below  Diagnostic and Interventional Summary    EKG Interpretation Date/Time:  Friday May 27 2023 04:11:02 EDT Ventricular Rate:  65 PR Interval:  198 QRS Duration:  99 QT Interval:  448 QTC Calculation: 466 R Axis:   77  Text Interpretation: Sinus arrhythmia Inferior infarct, acute (RCA) Lateral leads are also involved Probable RV involvement, suggest recording right precordial leads >>> Acute MI <<< Confirmed by Kennis Carina [phone]) on 05/27/2023 4:40:18 AM       Labs Reviewed  CBC  COMPREHENSIVE METABOLIC PANEL  LIPASE, BLOOD  PROTIME-INR  HEMOGLOBIN A1C  APTT   LIPID PANEL  I-STAT CG4 LACTIC ACID, ED  TROPONIN I (HIGH SENSITIVITY)    No orders to display    Medications  sodium chloride 0.9 % bolus 1,000 mL (1,000 mLs Intravenous New Bag/Given 05/27/23 0422)  ondansetron (ZOFRAN) injection 4 mg (4 mg Intravenous Given 05/27/23 0424)  HYDROmorphone (DILAUDID) injection 1 mg (1 mg Intravenous Given 05/27/23 0423)  aspirin chewable tablet 324 mg (324 mg Oral Given 05/27/23 0421)  heparin injection 3,950 Units (3,950 Units Intravenous Given 05/27/23 0425)     Procedures  /  Critical Care .Critical Care  Performed by: Sabas Sous, MD Authorized by: Sabas Sous, MD   Critical care provider statement:    Critical care time (minutes):  35   Critical care was necessary to treat or prevent imminent or life-threatening deterioration of the following conditions: ST elevation myocardial infarction.   Critical care was time spent personally by me on the following activities:  Development of treatment plan with patient or surrogate, discussions with consultants, evaluation of patient's response to treatment, examination of patient, ordering and review of laboratory studies, ordering and review of radiographic studies, ordering and performing treatments and interventions, pulse oximetry, re-evaluation of patient's condition and review of old charts   ED Course and Medical Decision Making  Initial Impression and Ddx Differential diagnosis includes ACS, GERD, esophageal spasm, overall low concern for dissection/PE.  Past medical/surgical history that increases complexity of ED encounter: Positive family history for heart disease  Interpretation of Diagnostics I personally reviewed the EKG and my interpretation is as follows: Acute ST elevation myocardial infarction with elevation inferiorly, depression laterally    Patient Reassessment and Ultimate Disposition/Management     EKG confirms STEMI, patient doing well hemodynamically, no ectopy on  cardiac monitoring.  ZOLL applied.  Patient given aspirin, heparin, pain and nausea control.  Code STEMI initiated, still awaiting callback from STEMI cardiologist but not waiting on a call back to arrange transfer.  Dr. Melene Plan is the accepting ED provider at [facility] emergency department.  Patient management required discussion with the following services or consulting groups:  None  Complexity of Problems Addressed Acute illness or injury that poses threat of life of bodily function  Additional Data Reviewed and Analyzed Further history obtained from: Further history from spouse/family member  Additional Factors Impacting ED Encounter Risk Use of parenteral controlled substances and Consideration of hospitalization  Elmer Sow. Pilar Plate, MD [facility] Emergency Medicine [facility] [email or web address]  Final Clinical Impressions(s) / ED Diagnoses     ICD-10-CM   1. ST elevation myocardial infarction (STEMI), unspecified artery (HCC)  I21.3       ED Discharge Orders     None  Discharge Instructions Discussed with and Provided to Patient:   Discharge Instructions   None      Sabas Sous, MD 05/27/23 [phone]

## 2023-05-27 NOTE — ED Provider Notes (Signed)
[facility]-EMERGENCY DEPT [facility] Emergency Department Provider Note MRN:  [id number]  Arrival date & time: 05/27/23     Chief Complaint   Chest Pain   History of Present Illness   Juan Schultz is a 67 y.o. year-old male with no pertinent past medical history presenting to the ED with chief complaint of pain.  Chest pain soon after dinner, initially mild.  At about 10 or 10:30 PM while getting ready for bed became much more severe, mid and left-sided chest pain with radiation down the left arm.  Associated with diaphoresis, belching, also had some abdominal bloating sensation.  Symptoms have since resolved.  Currently without symptoms.  No personal history of heart disease, positive family history (brother).  No history of blood clots, no recent leg pain or swelling, no shortness of breath.  Review of Systems  A thorough review of systems was obtained and all systems are negative except as noted in the HPI and PMH.   Patient's Health History    Past Medical History:  Diagnosis Date   Seasonal allergies     Past Surgical History:  Procedure Laterality Date   COLONOSCOPY N/A 03/14/2015   Procedure: COLONOSCOPY;  Surgeon: West Bali, MD;  Location: [facility] ENDO SUITE;  Service: Endoscopy;  Laterality: N/A;  8:30 AM   HERNIA REPAIR  08/24/2011   KNEE ARTHROSCOPY Right 08/23/2000    Family History  Problem Relation Age of Onset   Parkinsonism Neg Hx     Social History   Socioeconomic History   Marital status: Married    Spouse name: Clydie Braun   Number of children: 2   Years of education: College   Highest education level: Not on file  Occupational History   Occupation: Programme researcher, broadcasting/film/video  Tobacco Use   Smoking status: Never   Smokeless tobacco: Never  Vaping Use   Vaping status: Never Used  Substance and Sexual Activity   Alcohol use: No    Alcohol/week: 0.0 standard drinks of alcohol    Comment: Quit in 1981   Drug use: No   Sexual activity: Yes  Other Topics  Concern   Not on file  Social History Narrative   Lives at home with wife and son.   Caffeine use: 1 cups coffee per day   Drinks tea occass   Social Determinants of Health   Financial Resource Strain: Not on file  Food Insecurity: Low Risk  (04/20/2023)   Received from [facility]   Hunger Vital Sign    Worried About Running Out of Food in the Last Year: Never true    Ran Out of Food in the Last Year: Never true  Transportation Needs: No Transportation Needs (04/20/2023)   Received from [facility]    In the past 12 months, has lack of reliable transportation kept you from medical appointments, meetings, work or from getting things needed for daily living? : No  Physical Activity: Not on file  Stress: Not on file  Social Connections: Not on file  Intimate Partner Violence: Unknown (10/10/2022)   Received from [facility], [facility]   Domestic Violence Screen    Physical Abuse Risk: Unknown    Verbal Abuse Risk: Unknown     Physical Exam   Vitals:   05/27/23 0030 05/27/23 0100  BP: 124/76 115/84  Pulse: 71 71  Resp: 15 15  Temp:    SpO2: 94% 95%    CONSTITUTIONAL: Well-appearing, NAD NEURO/PSYCH:  Alert and oriented  x 3, no focal deficits EYES:  eyes equal and reactive ENT/NECK:  no LAD, no JVD CARDIO: Regular rate, well-perfused, normal S1 and S2 PULM:  CTAB no wheezing or rhonchi GI/GU:  non-distended, non-tender MSK/SPINE:  No gross deformities, no edema SKIN:  no rash, atraumatic   *Additional and/or pertinent findings included in MDM below  Diagnostic and Interventional Summary    EKG Interpretation Date/Time:  Thursday May 26 2023 22:46:19 EDT Ventricular Rate:  83 PR Interval:  160 QRS Duration:  86 QT Interval:  388 QTC Calculation: 455 R Axis:   -5  Text Interpretation: Normal sinus rhythm Normal ECG When compared with ECG of 26-May-2023 22:45, No significant change was found Confirmed by Kennis Carina  [phone]) on 05/26/2023 10:55:28 PM       Labs Reviewed  BASIC METABOLIC PANEL - Abnormal; Notable for the following components:      Result Value   Potassium 3.4 (*)    Glucose, Bld 172 (*)    All other components within normal limits  CBC  TROPONIN I (HIGH SENSITIVITY)  TROPONIN I (HIGH SENSITIVITY)    DG Chest 2 View  Final Result      Medications - No data to display   Procedures  /  Critical Care Procedures  ED Course and Medical Decision Making  Initial Impression and Ddx Differential diagnosis includes ACS, however favoring GI etiology.  Heart score is 4.  Patient is well-appearing with no symptoms currently, vitals normal.  Will need to troponins.  Nothing to suggest PE or dissection.  Lungs clear.  Past medical/surgical history that increases complexity of ED encounter: Brother with multiple cardiac stents  Interpretation of Diagnostics I personally reviewed the EKG and my interpretation is as follows: Sinus rhythm without concerning ischemic features  Labs reassuring with no significant blood count or electrolyte disturbance.  Troponin negative x 2  Patient Reassessment and Ultimate Disposition/Management     Patient continues to be pain-free with normal vital signs, no indication for further testing or admission.  Discharge  Patient management required discussion with the following services or consulting groups:  None  Complexity of Problems Addressed Acute illness or injury that poses threat of life of bodily function  Additional Data Reviewed and Analyzed Further history obtained from: Further history from spouse/family member  Additional Factors Impacting ED Encounter Risk Consideration of hospitalization  Elmer Sow. Pilar Plate, MD [facility] Emergency Medicine [facility] [email or web address]  Final Clinical Impressions(s) / ED Diagnoses     ICD-10-CM   1. Chest pain, unspecified type  R07.9 Ambulatory referral to Cardiology      ED  Discharge Orders          Ordered    Ambulatory referral to Cardiology        05/27/23 0147    omeprazole (PRILOSEC) 20 MG capsule  Daily        05/27/23 0147             Discharge Instructions Discussed with and Provided to Patient:     Discharge Instructions      You were evaluated in the Emergency Department and after careful evaluation, we did not find any emergent condition requiring admission or further testing in the hospital.  Your exam/testing today is overall reassuring.  No signs of heart damage.  Still we recommend follow-up with the cardiology team to discuss need for outpatient stress testing.  Symptoms may be stomach acid related.  Can take the omeprazole prescription daily to prevent  pain.  Please return to the Emergency Department if you experience any worsening of your condition.   Thank you for allowing [place] to be a part of your care.       Sabas Sous, MD 05/27/23 [phone]

## 2023-05-27 NOTE — TOC Benefit Eligibility Note (Addendum)
Patient Product/process development scientist completed.    The patient is insured through [employer]. Patient has [employer], may use a copay card, and/or apply for patient assistance if available.    Ran test claim for Brilinta 90 mg and Non Formulary, must try Plavix or Effient first.  Ran test claim for prasugrel (Effient) 10 mg and 30 day copay is $0.00  This test claim was processed through [facility]- copay amounts may vary at other pharmacies due to [employer], or as the patient moves through the different stages of their insurance plan.     Roland Earl, CPHT Pharmacy Technician III Certified Patient Advocate [facility] Pharmacy Patient Advocate Team Direct Number: [phone]  Fax: [phone]

## 2023-05-27 NOTE — ED Notes (Signed)
CODE STEMI CALLED. 

## 2023-05-27 NOTE — Research (Signed)
Patient seen for screening for inclusion in ARTEMIS clinical trial. Patient and wife present for discussion. They were given copies of informed consent. They would like time to think about it. Team will follow up for their decision.  Jodelle Red, MD, PhD, [facility]  [facility]  [place]  Heart & Vascular at [facility] at [facility] [address], [address] [place], [place] [address] [phone]

## 2023-05-27 NOTE — ED Notes (Signed)
ED TO INPATIENT HANDOFF REPORT  ED Nurse Name and Phone #: Manus Gunning, RN   S Name/Age/Gender Juan Schultz 67 y.o. male Room/Bed: APA12/APA12  Code Status   Code Status: Not on file  Home/SNF/Other Home Patient oriented to: self, place, time, and situation Is this baseline? Yes   Triage Complete: Triage complete  Chief Complaint Chest Pain  Triage Note Pt c/o left sided chest pain with left arm pain, nausea, diaphoretic x ago, pt was seen for chest pain last night     Allergies Allergies  Allergen Reactions   Tequin [Gatifloxacin]     Mouth got numb    Level of Care/Admitting Diagnosis ED Disposition     None       B Medical/Surgery History Past Medical History:  Diagnosis Date   Seasonal allergies    Past Surgical History:  Procedure Laterality Date   COLONOSCOPY N/A 03/14/2015   Procedure: COLONOSCOPY;  Surgeon: West Bali, MD;  Location: [facility] ENDO SUITE;  Service: Endoscopy;  Laterality: N/A;  8:30 AM   HERNIA REPAIR  08/24/2011   KNEE ARTHROSCOPY Right 08/23/2000     A IV Location/Drains/Wounds Patient Lines/Drains/Airways Status     Active Line/Drains/Airways     Name Placement date Placement time Site Days   Peripheral IV 05/27/23 20 G 1" Left Forearm 05/27/23  0420  Forearm  less than 1   Peripheral IV 05/27/23 20 G 1" Right Antecubital 05/27/23  0420  Antecubital  less than 1            Intake/Output Last 24 hours No intake or output data in the 24 hours ending 05/27/23 0431  Labs/Imaging Results for orders placed or performed during the hospital encounter of 05/26/23 (from the past 48 hour(s))  Basic metabolic panel     Status: Abnormal   Collection Time: 05/26/23 10:51 PM  Result Value Ref Range   Sodium 135 135 - 145 mmol/L   Potassium 3.4 (L) 3.5 - 5.1 mmol/L   Chloride 100 98 - 111 mmol/L   CO2 25 22 - 32 mmol/L   Glucose, Bld 172 (H) 70 - 99 mg/dL    Comment: Glucose reference range applies only to samples  taken after fasting for at least 8 hours.   BUN 21 8 - 23 mg/dL   Creatinine, Ser 9.56 0.61 - 1.24 mg/dL   Calcium 9.2 8.9 - 21.3 mg/dL   GFR, Estimated >08 >65 mL/min    Comment: (NOTE) Calculated using the CKD-EPI Creatinine Equation (2021)    Anion gap 10 5 - 15    Comment: Performed at [facility], [address], [place], [place] [address]  CBC     Status: None   Collection Time: 05/26/23 10:51 PM  Result Value Ref Range   WBC 5.3 4.0 - 10.5 K/uL   RBC 4.71 4.22 - 5.81 MIL/uL   Hemoglobin 13.9 13.0 - 17.0 g/dL   HCT 62.9 52.8 - 41.3 %   MCV 89.4 80.0 - 100.0 fL   MCH 29.5 26.0 - 34.0 pg   MCHC 33.0 30.0 - 36.0 g/dL   RDW 24.4 01.0 - 27.2 %   Platelets 233 150 - 400 K/uL   nRBC 0.0 0.0 - 0.2 %    Comment: Performed at [facility], [address], [place], [place] [address]  Troponin I (High Sensitivity)     Status: None   Collection Time: 05/26/23 10:51 PM  Result Value Ref Range   Troponin I (High  Sensitivity) 3 <18 ng/L    Comment: (NOTE) Elevated high sensitivity troponin I (hsTnI) values and significant  changes across serial measurements may suggest ACS but many other  chronic and acute conditions are known to elevate hsTnI results.  Refer to the "Links" section for chest pain algorithms and additional  guidance. Performed at [facility], [address] [place], [place] [address]   Troponin I (High Sensitivity)     Status: None   Collection Time: 05/27/23 12:45 AM  Result Value Ref Range   Troponin I (High Sensitivity) 6 <18 ng/L    Comment: (NOTE) Elevated high sensitivity troponin I (hsTnI) values and significant  changes across serial measurements may suggest ACS but many other  chronic and acute conditions are known to elevate hsTnI results.  Refer to the "Links" section for chest pain algorithms and additional  guidance. Performed at [facility], [address], [place], [place] [address]    DG Chest 2 View  Result Date: 05/26/2023 CLINICAL DATA:   Chest pain EXAM: CHEST - 2 VIEW COMPARISON:  10/31/2022 FINDINGS: The heart size and mediastinal contours are within normal limits. Both lungs are clear. The visualized skeletal structures are unremarkable. IMPRESSION: No active cardiopulmonary disease. Electronically Signed   By: Jasmine Pang M.D.   On: 05/26/2023 23:15    Pending Labs Unresulted Labs (From admission, onward)     Start     Ordered   05/27/23 0415  Hemoglobin A1c  (Stemi Panel (PNL))  ONCE - URGENT,   URGENT        05/27/23 0415   05/27/23 0415  APTT  (Stemi Panel (PNL))  ONCE - STAT,   STAT        05/27/23 0415   05/27/23 0415  Lipid panel  (Stemi Panel (PNL))  Once,   URGENT        05/27/23 0415   05/27/23 0411  CBC  ONCE - STAT,   STAT        05/27/23 0410   05/27/23 0411  Comprehensive metabolic panel  ONCE - STAT,   STAT        05/27/23 0410   05/27/23 0411  Lipase, blood  ONCE - STAT,   STAT        05/27/23 0410   05/27/23 0411  Protime-INR  Once,   STAT        05/27/23 0410            Vitals/Pain Today's Vitals   05/27/23 0408 05/27/23 0415 05/27/23 0426  BP:  (!) 114/90   Pulse:  60   Resp:  15   Temp:   (!) 97.4 F (36.3 C)  TempSrc:   Oral  SpO2:  96%   Weight: 145 lb (65.8 kg)    Height: 5' 6.5" (1.689 m)    PainSc: 10-Worst pain ever      Isolation Precautions No active isolations  Medications Medications  sodium chloride 0.9 % bolus 1,000 mL (1,000 mLs Intravenous New Bag/Given 05/27/23 0422)  ondansetron (ZOFRAN) injection 4 mg (4 mg Intravenous Given 05/27/23 0424)  HYDROmorphone (DILAUDID) injection 1 mg (1 mg Intravenous Given 05/27/23 0423)  aspirin chewable tablet 324 mg (324 mg Oral Given 05/27/23 0421)  heparin injection 3,950 Units (3,950 Units Intravenous Given 05/27/23 0425)    Mobility Walks at baseline.      Focused Assessments Cardiac Assessment Handoff:  Cardiac Rhythm: (S) Normal sinus rhythm, Other (Comment) (STEMI) No results found for: "CKTOTAL", "CKMB",  "CKMBINDEX", "TROPONINI"  No results found for: "DDIMER" Does the Patient currently have chest pain? Yes    R Recommendations: See Admitting Provider Note  Report given to:   Additional Notes: 33M Returns to ED for eval with worsening cp. Presented last night with pains beginning after dinner. Upon return to ED has worsening cp/ indigestion sensation. EKG changes and poor appearing presentation

## 2023-05-27 NOTE — H&P (Signed)
Cardiology Admission History and Physical   Patient ID: Juan Schultz MRN: [id number]; DOB: Sep 22, 1955   Admission date: 05/27/2023  PCP:  Assunta Found, MD   [facility] Providers Cardiologist:  None   Chief Complaint:  Chest pain  History of Present Illness:   Juan Schultz is a 67 yo male with no prior medical history who presented to [facility] ED with c/o chest pain last night around 10pm. Troponin negative x 2 and EKG without ischemic changes. His chest pain resolved and he was discharged home. He presented back to the [facility] ED around 4 am with recurrent severe chest pain. EKG with inferior ST elevation c/w acute MI. Code STEMI activated by ED staff. Pt transported to [facility] for emergent cardiac cath. Pt is chest pain free on arrival to [facility].    Past Medical History:  Diagnosis Date   Seasonal allergies     Past Surgical History:  Procedure Laterality Date   COLONOSCOPY N/A 03/14/2015   Procedure: COLONOSCOPY;  Surgeon: West Bali, MD;  Location: [facility] ENDO SUITE;  Service: Endoscopy;  Laterality: N/A;  8:30 AM   HERNIA REPAIR  08/24/2011   KNEE ARTHROSCOPY Right 08/23/2000     Medications Prior to Admission: Prior to Admission medications   Medication Sig Start Date End Date Taking? Authorizing Provider  albuterol (VENTOLIN HFA) 108 (90 Base) MCG/ACT inhaler Inhale 2 puffs into the lungs as needed for wheezing or shortness of breath.    [provider]  amoxicillin-clavulanate (AUGMENTIN) 875-125 MG tablet Take 1 tablet by mouth every 12 (twelve) hours. 10/31/22   Eber Hong, MD  aspirin EC 81 MG tablet Take 1 tablet (81 mg total) by mouth daily. 03/06/15   Pricilla Riffle, MD  benzonatate (TESSALON) 100 MG capsule Take 1-2 capsules (100-200 mg total) by mouth 3 (three) times daily as needed for cough. 11/27/22   Ward, Tylene Fantasia, PA-C  levocetirizine (XYZAL) 5 MG tablet Take 1 tablet (5 mg total) by mouth every evening. 08/24/22   Wallis Bamberg, PA-C   loratadine (CLARITIN) 10 MG tablet Take 10 mg by mouth daily.    [provider]  montelukast (SINGULAIR) 10 MG tablet Take 10 mg by mouth daily as needed (allergies).    [provider]  omeprazole (PRILOSEC) 20 MG capsule Take 1 capsule (20 mg total) by mouth daily. 05/27/23   Sabas Sous, MD  OVER THE COUNTER MEDICATION Take 1 packet by mouth daily. "Green Vibrance" vitamin supplement    [provider]  oxyCODONE-acetaminophen (PERCOCET/ROXICET) 5-325 MG tablet Take 1 tablet by mouth every 6 (six) hours as needed for severe pain. 10/31/22   Eber Hong, MD  predniSONE (DELTASONE) 20 MG tablet Take 2 tablets daily with breakfast. 11/27/22   Ward, Tylene Fantasia, PA-C  promethazine-dextromethorphan (PROMETHAZINE-DM) 6.25-15 MG/5ML syrup Take 5 mLs by mouth 4 (four) times daily as needed for cough. 08/24/22   Wallis Bamberg, PA-C  pseudoephedrine (SUDAFED) 30 MG tablet Take 1 tablet (30 mg total) by mouth every 8 (eight) hours as needed for congestion. 08/24/22   Wallis Bamberg, PA-C  Saw Palmetto, Serenoa repens, (SAW PALMETTO PO) Take 1 capsule by mouth daily.    [provider]     Allergies:    Allergies  Allergen Reactions   Tequin [Gatifloxacin]     Mouth got numb    Social History:   Social History   Socioeconomic History   Marital status: Married    Spouse name:  Clydie Braun   Number of children: 2   Years of education: College   Highest education level: Not on file  Occupational History   Occupation: Programme researcher, broadcasting/film/video  Tobacco Use   Smoking status: Never   Smokeless tobacco: Never  Vaping Use   Vaping status: Never Used  Substance and Sexual Activity   Alcohol use: No    Alcohol/week: 0.0 standard drinks of alcohol    Comment: Quit in 1981   Drug use: No   Sexual activity: Yes  Other Topics Concern   Not on file  Social History Narrative   Lives at home with wife and son.   Caffeine use: 1 cups coffee per day   Drinks tea occass   Social  Determinants of Health   Financial Resource Strain: Not on file  Food Insecurity: Low Risk  (04/20/2023)   Received from [facility]   Hunger Vital Sign    Worried About Running Out of Food in the Last Year: Never true    Ran Out of Food in the Last Year: Never true  Transportation Needs: No Transportation Needs (04/20/2023)   Received from [facility]    In the past 12 months, has lack of reliable transportation kept you from medical appointments, meetings, work or from getting things needed for daily living? : No  Physical Activity: Not on file  Stress: Not on file  Social Connections: Not on file  Intimate Partner Violence: Unknown (10/10/2022)   [facility], [facility]   Domestic Violence Screen    Physical Abuse Risk: Unknown    Verbal Abuse Risk: Unknown    Family History:   The patient's family history is negative for Parkinsonism.    ROS:  Please see the history of present illness.  All other ROS reviewed and negative.     Physical Exam/Data:   Vitals:   05/27/23 0408 05/27/23 0415 05/27/23 0426  BP:  (!) 114/90   Pulse:  60   Resp:  15   Temp:   (!) 97.4 F (36.3 C)  TempSrc:   Oral  SpO2:  96%   Weight: 65.8 kg    Height: 5' 6.5" (1.689 m)     No intake or output data in the 24 hours ending 05/27/23 0507    05/27/2023    4:08 AM 05/26/2023   10:39 PM 10/31/2022    1:36 AM  Last 3 Weights  Weight (lbs) 145 lb 145 lb 145 lb  Weight (kg) 65.772 kg 65.772 kg 65.772 kg     Body mass index is 23.05 kg/m.  General:  Well nourished, well developed, in no acute distress HEENT: normal Neck: no JVD Vascular: No carotid bruits; Distal pulses 2+ bilaterally   Cardiac:  normal S1, S2; RRR; no murmur  Lungs:  clear to auscultation bilaterally, no wheezing, rhonchi or rales  Abd: soft, nontender, no hepatomegaly  Ext: no LE edema Musculoskeletal:  No deformities, BUE and BLE strength normal and equal Skin: warm  and dry  Neuro:  CNs 2-12 intact, no focal abnormalities noted Psych:  Normal affect    EKG:  The ECG that was done was personally reviewed and demonstrates   Relevant CV Studies:   Laboratory Data:  High Sensitivity Troponin:   Recent Labs  Lab 05/26/23 2251 05/27/23 0045  TROPONINIHS 3 6      Chemistry Recent Labs  Lab 05/26/23 2251  NA 135  K 3.4*  CL 100  CO2  25  GLUCOSE 172*  BUN 21  CREATININE 1.21  CALCIUM 9.2  GFRNONAA >60  ANIONGAP 10    No results for input(s): "PROT", "ALBUMIN", "AST", "ALT", "ALKPHOS", "BILITOT" in the last 168 hours. Lipids No results for input(s): "CHOL", "TRIG", "HDL", "LABVLDL", "LDLCALC", "CHOLHDL" in the last 168 hours. Hematology Recent Labs  Lab 05/26/23 2251 05/27/23 0428  WBC 5.3 10.4  RBC 4.71 4.83  HGB 13.9 14.2  HCT 42.1 43.3  MCV 89.4 89.6  MCH 29.5 29.4  MCHC 33.0 32.8  RDW 13.2 13.2  PLT 233 287   Thyroid No results for input(s): "TSH", "FREET4" in the last 168 hours. BNPNo results for input(s): "BNP", "PROBNP" in the last 168 hours.  DDimer No results for input(s): "DDIMER" in the last 168 hours.   Radiology/Studies:  DG Chest 2 View  Result Date: 05/26/2023 CLINICAL DATA:  Chest pain EXAM: CHEST - 2 VIEW COMPARISON:  10/31/2022 FINDINGS: The heart size and mediastinal contours are within normal limits. Both lungs are clear. The visualized skeletal structures are unremarkable. IMPRESSION: No active cardiopulmonary disease. Electronically Signed   By: Jasmine Pang M.D.   On: 05/26/2023 23:15     Assessment and Plan:   Acute inferior STEMI: Plan emergent cardiac cath. Further plans to follow   Code Status: Full Code  Severity of Illness: The appropriate patient status for this patient is INPATIENT. Inpatient status is judged to be reasonable and necessary in order to provide the required intensity of service to ensure the patient's safety. The patient's presenting symptoms, physical exam findings, and  initial radiographic and laboratory data in the context of their chronic comorbidities is felt to place them at high risk for further clinical deterioration. Furthermore, it is not anticipated that the patient will be medically stable for discharge from the hospital within 2 midnights of admission.   * I certify that at the point of admission it is my clinical judgment that the patient will require inpatient hospital care spanning beyond 2 midnights from the point of admission due to high intensity of service, high risk for further deterioration and high frequency of surveillance required.*   For questions or updates, please contact [facility] Please consult [email or web address] for contact info under     Signed, Verne Carrow, MD  05/27/2023 5:07 AM

## 2023-05-28 DIAGNOSIS — I2119 ST elevation (STEMI) myocardial infarction involving other coronary artery of inferior wall: Secondary | ICD-10-CM | POA: Diagnosis not present

## 2023-05-28 DIAGNOSIS — Z955 Presence of coronary angioplasty implant and graft: Secondary | ICD-10-CM

## 2023-05-28 DIAGNOSIS — E785 Hyperlipidemia, unspecified: Secondary | ICD-10-CM | POA: Diagnosis not present

## 2023-05-28 LAB — CBC
HCT: 37 % — ABNORMAL LOW (ref 39.0–52.0)
Hemoglobin: 12.2 g/dL — ABNORMAL LOW (ref 13.0–17.0)
MCH: 28.8 pg (ref 26.0–34.0)
MCHC: 33 g/dL (ref 30.0–36.0)
MCV: 87.3 fL (ref 80.0–100.0)
Platelets: 225 10*3/uL (ref 150–400)
RBC: 4.24 MIL/uL (ref 4.22–5.81)
RDW: 13.4 % (ref 11.5–15.5)
WBC: 8.1 10*3/uL (ref 4.0–10.5)
nRBC: 0 % (ref 0.0–0.2)

## 2023-05-28 LAB — BASIC METABOLIC PANEL
Anion gap: 10 (ref 5–15)
BUN: 11 mg/dL (ref 8–23)
CO2: 24 mmol/L (ref 22–32)
Calcium: 8.8 mg/dL — ABNORMAL LOW (ref 8.9–10.3)
Chloride: 104 mmol/L (ref 98–111)
Creatinine, Ser: 0.84 mg/dL (ref 0.61–1.24)
GFR, Estimated: >60 mL/min (ref >60)
Glucose, Bld: 111 mg/dL — ABNORMAL HIGH (ref 70–99)
Potassium: 3.9 mmol/L (ref 3.5–5.1)
Sodium: 138 mmol/L (ref 135–145)

## 2023-05-28 LAB — MAGNESIUM: Magnesium: 2.1 mg/dL (ref 1.7–2.4)

## 2023-05-28 NOTE — Progress Notes (Signed)
Rounding Note    Patient Name: Juan Schultz Date of Encounter: 05/28/2023  [facility] Cardiologist: Dr. Earney Hamburg  Subjective   No acute overnight events. Had some softer blood pressures overnight but was asymptomatic. Reports good UOP from lasix.   Inpatient Medications    Scheduled Meds:  aspirin  81 mg Oral Daily   atorvastatin  80 mg Oral Daily   Chlorhexidine Gluconate Cloth  6 each Topical Daily   sodium chloride flush  3 mL Intravenous Q12H   ticagrelor  90 mg Oral BID   Continuous Infusions:  sodium chloride     PRN Meds: sodium chloride, acetaminophen, ondansetron (ZOFRAN) IV, mouth rinse, oxyCODONE, sodium chloride flush   Vital Signs    Vitals:   05/28/23 0600 05/28/23 0700 05/28/23 0800 05/28/23 0845  BP: (!) 87/71 96/60 (!) 85/67   Pulse: 70 70 79   Resp: 11 13 15    Temp:    98.2 F (36.8 C)  TempSrc:    Oral  SpO2: 92% 95% 93%   Weight:      Height:        Intake/Output Summary (Last 24 hours) at 05/28/2023 1021 Last data filed at 05/28/2023 0800 Gross per 24 hour  Intake 459.93 ml  Output 1650 ml  Net -1190.07 ml      05/27/2023    4:08 AM 05/26/2023   10:39 PM 10/31/2022    1:36 AM  Last 3 Weights  Weight (lbs) 145 lb 145 lb 145 lb  Weight (kg) 65.772 kg 65.772 kg 65.772 kg      Telemetry    Sinus rhythm. No ventricular arrhythmias or frequent PVCs. - Personally Reviewed  ECG    Sinus rhythm at 73bpm without ST changes.- Personally Reviewed  Physical Exam   GEN: No acute distress.   Neck: No JVD Cardiac: Normal rate and regular, no murmurs, rubs, or gallops.  Respiratory: Clear to auscultation bilaterally. GI: Soft, nontender, non-distended  Ext: No edema; No deformity. Groin: Right groin site without hematoma. Neuro:  Nonfocal  Psych: Normal affect   Labs    High Sensitivity Troponin:   Recent Labs  Lab 05/26/23 2251 05/27/23 0045 05/27/23 0428 05/27/23 0949  TROPONINIHS 3 6 9  >24,000*      Chemistry Recent Labs  Lab 05/26/23 2251 05/27/23 0428 05/28/23 0413  NA 135 137 138  K 3.4* 3.1* 3.9  CL 100 102 104  CO2 25 22 24   GLUCOSE 172* 165* 111*  BUN 21 18 11   CREATININE 1.21 0.99 0.84  CALCIUM 9.2 8.9 8.8*  MG  --   --  2.1  PROT  --  6.6  --   ALBUMIN  --  4.1  --   AST  --  36  --   ALT  --  41  --   ALKPHOS  --  61  --   BILITOT  --  0.4  --   GFRNONAA >60 >60 >60  ANIONGAP 10 13 10     Lipids  Recent Labs  Lab 05/27/23 0428  CHOL 351*  TRIG 312*  HDL 58  LDLCALC 231*  CHOLHDL 6.1    Hematology Recent Labs  Lab 05/26/23 2251 05/27/23 0428 05/28/23 0413  WBC 5.3 10.4 8.1  RBC 4.71 4.83 4.24  HGB 13.9 14.2 12.2*  HCT 42.1 43.3 37.0*  MCV 89.4 89.6 87.3  MCH 29.5 29.4 28.8  MCHC 33.0 32.8 33.0  RDW 13.2 13.2 13.4  PLT 233 287 225  Thyroid No results for input(s): "TSH", "FREET4" in the last 168 hours.  BNPNo results for input(s): "BNP", "PROBNP" in the last 168 hours.  DDimer No results for input(s): "DDIMER" in the last 168 hours.   Radiology    ECHOCARDIOGRAM COMPLETE  Result Date: 05/27/2023    ECHOCARDIOGRAM REPORT   Patient Name:   Juan Schultz Date of Exam: 05/27/2023 Medical Rec #:  [id number]       Height:       66.5 in Accession #:    [id number]      Weight:       145.0 lb Date of Birth:  06/10/1956       BSA:          1.754 m Patient Age:    67 years        BP:           100/64 mmHg Patient Gender: M               HR:           79 bpm. Exam Location:  Inpatient Procedure: 2D Echo, Color Doppler, Cardiac Doppler and Intracardiac            Opacification Agent Indications:    STEMI  History:        Patient has no prior history of Echocardiogram examinations.                 Acute MI; Risk Factors:Dyslipidemia.  Sonographer:    Milbert Coulter Referring Phys: 100 CHRISTOPHER D MCALHANY IMPRESSIONS  1. Left ventricular ejection fraction, by estimation, is 55 to 60%. The left ventricle has normal function. The left ventricle has no regional  wall motion abnormalities. There is mild concentric left ventricular hypertrophy. Left ventricular diastolic parameters are indeterminate.  2. Right ventricular systolic function is normal. The right ventricular size is normal. Tricuspid regurgitation signal is inadequate for assessing PA pressure.  3. The mitral valve is normal in structure. No evidence of mitral valve regurgitation. No evidence of mitral stenosis.  4. The aortic valve is normal in structure. Aortic valve regurgitation is not visualized. No aortic stenosis is present.  5. The inferior vena cava is normal in size with greater than 50% respiratory variability, suggesting right atrial pressure of 3 mmHg. FINDINGS  Left Ventricle: Left ventricular ejection fraction, by estimation, is 55 to 60%. The left ventricle has normal function. The left ventricle has no regional wall motion abnormalities. Definity contrast agent was given IV to delineate the left ventricular  endocardial borders. The left ventricular internal cavity size was normal in size. There is mild concentric left ventricular hypertrophy. Left ventricular diastolic parameters are indeterminate. Right Ventricle: The right ventricular size is normal. No increase in right ventricular wall thickness. Right ventricular systolic function is normal. Tricuspid regurgitation signal is inadequate for assessing PA pressure. Left Atrium: Left atrial size was normal in size. Right Atrium: Right atrial size was normal in size. Pericardium: There is no evidence of pericardial effusion. Mitral Valve: The mitral valve is normal in structure. No evidence of mitral valve regurgitation. No evidence of mitral valve stenosis. Tricuspid Valve: The tricuspid valve is normal in structure. Tricuspid valve regurgitation is not demonstrated. No evidence of tricuspid stenosis. Aortic Valve: The aortic valve is normal in structure. Aortic valve regurgitation is not visualized. No aortic stenosis is present. Aortic valve  mean gradient measures 2.0 mmHg. Aortic valve peak gradient measures 4.5 mmHg. Aortic valve area, by VTI measures 2.58  cm. Pulmonic Valve: The pulmonic valve was normal in structure. Pulmonic valve regurgitation is not visualized. No evidence of pulmonic stenosis. Aorta: The aortic root is normal in size and structure. Venous: The inferior vena cava is normal in size with greater than 50% respiratory variability, suggesting right atrial pressure of 3 mmHg. IAS/Shunts: No atrial level shunt detected by color flow Doppler.  LEFT VENTRICLE PLAX 2D LVIDd:         3.60 cm   Diastology LVIDs:         2.60 cm   LV e' medial:    7.40 cm/s LV PW:         1.20 cm   LV E/e' medial:  6.8 LV IVS:        1.30 cm   LV e' lateral:   8.70 cm/s LVOT diam:     1.90 cm   LV E/e' lateral: 5.8 LV SV:         45 LV SV Index:   26 LVOT Area:     2.84 cm  RIGHT VENTRICLE RV S prime:     9.25 cm/s TAPSE (M-mode): 1.7 cm LEFT ATRIUM             Index        RIGHT ATRIUM           Index LA diam:        2.60 cm 1.48 cm/m   RA Area:     12.30 cm LA Vol (A2C):   30.8 ml 17.56 ml/m  RA Volume:   25.60 ml  14.59 ml/m LA Vol (A4C):   34.1 ml 19.44 ml/m LA Biplane Vol: 34.4 ml 19.61 ml/m  AORTIC VALVE AV Area (Vmax):    2.34 cm AV Area (Vmean):   2.45 cm AV Area (VTI):     2.58 cm AV Vmax:           106.00 cm/s AV Vmean:          71.500 cm/s AV VTI:            0.175 m AV Peak Grad:      4.5 mmHg AV Mean Grad:      2.0 mmHg LVOT Vmax:         87.50 cm/s LVOT Vmean:        61.700 cm/s LVOT VTI:          0.159 m LVOT/AV VTI ratio: 0.91  AORTA Ao Root diam: 3.40 cm MITRAL VALVE MV Area (PHT): 3.23 cm    SHUNTS MV Decel Time: 235 msec    Systemic VTI:  0.16 m MV E velocity: 50.10 cm/s  Systemic Diam: 1.90 cm MV A velocity: 68.10 cm/s MV E/A ratio:  0.74 Kardie Tobb DO Electronically signed by Thomasene Ripple DO Signature Date/Time: 05/27/2023/1:50:36 PM    Final    CARDIAC CATHETERIZATION  Result Date: 05/27/2023   Prox RCA to Mid RCA lesion is  20% stenosed.   Dist RCA lesion is 99% stenosed.   Ost Cx to Prox Cx lesion is 40% stenosed.   1st Mrg lesion is 50% stenosed.   Prox LAD lesion is 80% stenosed.   A drug-eluting stent was successfully placed using a SYNERGY XD 2.50X16.   Post intervention, there is a 0% residual stenosis. Acute inferior STEMI secondary to severe distal RCA stenosis Successful PTCA/DES x 1 distal RCA Severe proximal LAD stenosis Moderate stenosis in the proximal Circumflex and obtuse marginal branch LVEDP=25 mmHg Recommendations: Will admit  to the ICU. Aggrastat infusion for 2 hours. Lasix 40 mg IV x 1 in ICU. DAPT with ASA/Brilinta for one year. High intensity statin. Echo later today. Will need staged PCI of the proximal LAD next week.   DG Chest 2 View  Result Date: 05/26/2023 CLINICAL DATA:  Chest pain EXAM: CHEST - 2 VIEW COMPARISON:  10/31/2022 FINDINGS: The heart size and mediastinal contours are within normal limits. Both lungs are clear. The visualized skeletal structures are unremarkable. IMPRESSION: No active cardiopulmonary disease. Electronically Signed   By: Jasmine Pang M.D.   On: 05/26/2023 23:15    Cardiac Studies   Cardiac catheterization/PCI and stent (05/27/23)  Conclusion      Prox RCA to Mid RCA lesion is 20% stenosed.   Dist RCA lesion is 99% stenosed.   Ost Cx to Prox Cx lesion is 40% stenosed.   1st Mrg lesion is 50% stenosed.   Prox LAD lesion is 80% stenosed.   A drug-eluting stent was successfully placed using a SYNERGY XD 2.50X16.   Post intervention, there is a 0% residual stenosis.   Acute inferior STEMI secondary to severe distal RCA stenosis Successful PTCA/DES x 1 distal RCA Severe proximal LAD stenosis Moderate stenosis in the proximal Circumflex and obtuse marginal branch LVEDP=25 mmHg   Recommendations: Will admit to the ICU. Aggrastat infusion for 2 hours. Lasix 40 mg IV x 1 in ICU. DAPT with ASA/Brilinta for one year. High intensity statin. Echo later today. Will need  staged PCI of the proximal LAD next week.   Diagrams  Diagnostic Dominance: Right  Intervention      Patient Profile     Mr. Mikhi Athey is a 67 year old male with no significant past medical history who presented to [facility] with chest pain on 10/3 with reportedly negative evaluation and was discharged home. He then presented to [facility] on 10/5 with an inferior STEMI and was brought to the Cath Lab by [name]. He underwent PCI to RCA. He was also found to have 80% prox LAD stenosis.   He had some softer blood pressures overnight. I suspect this is secondary to overdiuresis with IV lasix. His LV and RV function were normal on echocardiogram yesterday. I have liberalized his fluid intake today. Groin site without hematoma.  Assessment & Plan    #. Inferior STEMI: Status post RCA intervention with a 2.5 x 16 mm long Synergy drug-eluting stent postdilated 2.75 mm. He was on Aggrastat infusion.  He is on DAPT with aspirin and Brilinta with plans to transition to Effient as an outpatient.  He has 80% proximal LAD lesion with plans for staged PCI early Monday morning. - Continue aspirin and ticagrelor. - Continue atorvastatin.  - LAD PCI on Monday.  #: Hyperlipidemia: total cholesterol 351 with an LDL of 231 not on statin therapy.   - Started on atorvastatin.  If systolic blood pressures maintain >132mmHg and he remains chest pain free without ventricular arrhythmias on telemetry, then he can be transferred to a regular telemetry bed.  For questions or updates, please contact  [facility] Please consult [email or web address] for contact info under     Signed, Nobie Putnam, MD  05/28/2023, 10:21 AM

## 2023-05-28 NOTE — Plan of Care (Signed)
  Problem: Education: Goal: Knowledge of General Education information will improve Description: Including pain rating scale, medication(s)/side effects and non-pharmacologic comfort measures Outcome: Progressing   Problem: Health Behavior/Discharge Planning: Goal: Ability to manage health-related needs will improve Outcome: Progressing   Problem: Clinical Measurements: Goal: Ability to maintain clinical measurements within normal limits will improve Outcome: Progressing Goal: Will remain free from infection Outcome: Progressing Goal: Diagnostic test results will improve Outcome: Progressing Goal: Respiratory complications will improve Outcome: Progressing Goal: Cardiovascular complication will be avoided Outcome: Progressing   Problem: Activity: Goal: Risk for activity intolerance will decrease Outcome: Progressing   Problem: Nutrition: Goal: Adequate nutrition will be maintained Outcome: Progressing   Problem: Coping: Goal: Level of anxiety will decrease Outcome: Progressing   Problem: Pain Managment: Goal: General experience of comfort will improve Outcome: Progressing   Problem: Skin Integrity: Goal: Risk for impaired skin integrity will decrease Outcome: Progressing   Problem: Safety: Goal: Ability to remain free from injury will improve Outcome: Progressing

## 2023-05-29 DIAGNOSIS — I2119 ST elevation (STEMI) myocardial infarction involving other coronary artery of inferior wall: Secondary | ICD-10-CM | POA: Diagnosis not present

## 2023-05-29 MED ORDER — SODIUM CHLORIDE 0.9 % WEIGHT BASED INFUSION
3.0000 mL/kg/h | INTRAVENOUS | Status: DC
  Administered 2023-05-30: 3 mL/kg/h via INTRAVENOUS

## 2023-05-29 MED ORDER — SODIUM CHLORIDE 0.9 % IV BOLUS
500.0000 mL | Freq: Once | INTRAVENOUS | Status: AC
  Administered 2023-05-29: 500 mL via INTRAVENOUS

## 2023-05-29 MED ORDER — ASPIRIN 81 MG PO CHEW: 81.0000 mg | CHEWABLE_TABLET | ORAL | Status: DC

## 2023-05-29 MED ORDER — SODIUM CHLORIDE 0.9 % WEIGHT BASED INFUSION
1.0000 mL/kg/h | INTRAVENOUS | Status: DC
  Administered 2023-05-30: 200 mL via INTRAVENOUS

## 2023-05-29 NOTE — Progress Notes (Signed)
Pre cath orders written with standard IVF per Dr. Lavone Neri request

## 2023-05-29 NOTE — Progress Notes (Addendum)
Rounding Note    Patient Name: Juan Schultz Date of Encounter: 05/29/2023  [facility] Cardiologist: Dr. Earney Hamburg  Subjective   No acute overnight events. Patient continues to be asymptomatic. Blood pressures improved. No new or acute complaints.  Inpatient Medications    Scheduled Meds:  aspirin  81 mg Oral Daily   atorvastatin  80 mg Oral Daily   Chlorhexidine Gluconate Cloth  6 each Topical Daily   sodium chloride flush  3 mL Intravenous Q12H   ticagrelor  90 mg Oral BID   Continuous Infusions:  sodium chloride     sodium chloride     PRN Meds: sodium chloride, acetaminophen, ondansetron (ZOFRAN) IV, mouth rinse, oxyCODONE, sodium chloride flush   Vital Signs    Vitals:   05/29/23 0400 05/29/23 0500 05/29/23 0600 05/29/23 0700  BP: 95/61 (!) 82/66 99/87 (!) 81/57  Pulse: 73 75 81 74  Resp: 16 13 12 16   Temp: 98.4 F (36.9 C)     TempSrc: Oral     SpO2: 93% 94% 96% 94%  Weight: 64.4 kg     Height:        Intake/Output Summary (Last 24 hours) at 05/29/2023 0832 Last data filed at 05/29/2023 0400 Gross per 24 hour  Intake --  Output 2200 ml  Net -2200 ml      05/29/2023    4:00 AM 05/27/2023    4:08 AM 05/26/2023   10:39 PM  Last 3 Weights  Weight (lbs) 141 lb 15.6 oz 145 lb 145 lb  Weight (kg) 64.4 kg 65.772 kg 65.772 kg      Telemetry    Sinus rhythm. No ventricular arrhythmias or frequent PVCs. - Personally Reviewed  ECG    Sinus rhythm at 73bpm without ST changes.- Personally Reviewed  Physical Exam   GEN: No acute distress.   Neck: No JVD Cardiac: Normal rate and regular, no murmurs, rubs, or gallops.  Respiratory: Clear to auscultation bilaterally. GI: Soft, nontender, non-distended  Ext: No edema; No deformity. Groin: Right groin site without hematoma. Neuro:  Nonfocal  Psych: Normal affect   Labs    High Sensitivity Troponin:   Recent Labs  Lab 05/26/23 2251 05/27/23 0045 05/27/23 0428 05/27/23 0949   TROPONINIHS 3 6 9  >24,000*     Chemistry Recent Labs  Lab 05/26/23 2251 05/27/23 0428 05/28/23 0413  NA 135 137 138  K 3.4* 3.1* 3.9  CL 100 102 104  CO2 25 22 24   GLUCOSE 172* 165* 111*  BUN 21 18 11   CREATININE 1.21 0.99 0.84  CALCIUM 9.2 8.9 8.8*  MG  --   --  2.1  PROT  --  6.6  --   ALBUMIN  --  4.1  --   AST  --  36  --   ALT  --  41  --   ALKPHOS  --  61  --   BILITOT  --  0.4  --   GFRNONAA >60 >60 >60  ANIONGAP 10 13 10     Lipids  Recent Labs  Lab 05/27/23 0428  CHOL 351*  TRIG 312*  HDL 58  LDLCALC 231*  CHOLHDL 6.1    Hematology Recent Labs  Lab 05/26/23 2251 05/27/23 0428 05/28/23 0413  WBC 5.3 10.4 8.1  RBC 4.71 4.83 4.24  HGB 13.9 14.2 12.2*  HCT 42.1 43.3 37.0*  MCV 89.4 89.6 87.3  MCH 29.5 29.4 28.8  MCHC 33.0 32.8 33.0  RDW 13.2 13.2  13.4  PLT 233 287 225   Thyroid No results for input(s): "TSH", "FREET4" in the last 168 hours.  BNPNo results for input(s): "BNP", "PROBNP" in the last 168 hours.  DDimer No results for input(s): "DDIMER" in the last 168 hours.   Radiology    ECHOCARDIOGRAM COMPLETE  Result Date: 05/27/2023    ECHOCARDIOGRAM REPORT   Patient Name:   Juan Schultz Date of Exam: 05/27/2023 Medical Rec #:  [id number]       Height:       66.5 in Accession #:    [id number]      Weight:       145.0 lb Date of Birth:  1955/09/01       BSA:          1.754 m Patient Age:    67 years        BP:           100/64 mmHg Patient Gender: M               HR:           79 bpm. Exam Location:  Inpatient Procedure: 2D Echo, Color Doppler, Cardiac Doppler and Intracardiac            Opacification Agent Indications:    STEMI  History:        Patient has no prior history of Echocardiogram examinations.                 Acute MI; Risk Factors:Dyslipidemia.  Sonographer:    Milbert Coulter Referring Phys: 77 CHRISTOPHER D MCALHANY IMPRESSIONS  1. Left ventricular ejection fraction, by estimation, is 55 to 60%. The left ventricle has normal function.  The left ventricle has no regional wall motion abnormalities. There is mild concentric left ventricular hypertrophy. Left ventricular diastolic parameters are indeterminate.  2. Right ventricular systolic function is normal. The right ventricular size is normal. Tricuspid regurgitation signal is inadequate for assessing PA pressure.  3. The mitral valve is normal in structure. No evidence of mitral valve regurgitation. No evidence of mitral stenosis.  4. The aortic valve is normal in structure. Aortic valve regurgitation is not visualized. No aortic stenosis is present.  5. The inferior vena cava is normal in size with greater than 50% respiratory variability, suggesting right atrial pressure of 3 mmHg. FINDINGS  Left Ventricle: Left ventricular ejection fraction, by estimation, is 55 to 60%. The left ventricle has normal function. The left ventricle has no regional wall motion abnormalities. Definity contrast agent was given IV to delineate the left ventricular  endocardial borders. The left ventricular internal cavity size was normal in size. There is mild concentric left ventricular hypertrophy. Left ventricular diastolic parameters are indeterminate. Right Ventricle: The right ventricular size is normal. No increase in right ventricular wall thickness. Right ventricular systolic function is normal. Tricuspid regurgitation signal is inadequate for assessing PA pressure. Left Atrium: Left atrial size was normal in size. Right Atrium: Right atrial size was normal in size. Pericardium: There is no evidence of pericardial effusion. Mitral Valve: The mitral valve is normal in structure. No evidence of mitral valve regurgitation. No evidence of mitral valve stenosis. Tricuspid Valve: The tricuspid valve is normal in structure. Tricuspid valve regurgitation is not demonstrated. No evidence of tricuspid stenosis. Aortic Valve: The aortic valve is normal in structure. Aortic valve regurgitation is not visualized. No  aortic stenosis is present. Aortic valve mean gradient measures 2.0 mmHg. Aortic valve peak gradient measures 4.5  mmHg. Aortic valve area, by VTI measures 2.58 cm. Pulmonic Valve: The pulmonic valve was normal in structure. Pulmonic valve regurgitation is not visualized. No evidence of pulmonic stenosis. Aorta: The aortic root is normal in size and structure. Venous: The inferior vena cava is normal in size with greater than 50% respiratory variability, suggesting right atrial pressure of 3 mmHg. IAS/Shunts: No atrial level shunt detected by color flow Doppler.  LEFT VENTRICLE PLAX 2D LVIDd:         3.60 cm   Diastology LVIDs:         2.60 cm   LV e' medial:    7.40 cm/s LV PW:         1.20 cm   LV E/e' medial:  6.8 LV IVS:        1.30 cm   LV e' lateral:   8.70 cm/s LVOT diam:     1.90 cm   LV E/e' lateral: 5.8 LV SV:         45 LV SV Index:   26 LVOT Area:     2.84 cm  RIGHT VENTRICLE RV S prime:     9.25 cm/s TAPSE (M-mode): 1.7 cm LEFT ATRIUM             Index        RIGHT ATRIUM           Index LA diam:        2.60 cm 1.48 cm/m   RA Area:     12.30 cm LA Vol (A2C):   30.8 ml 17.56 ml/m  RA Volume:   25.60 ml  14.59 ml/m LA Vol (A4C):   34.1 ml 19.44 ml/m LA Biplane Vol: 34.4 ml 19.61 ml/m  AORTIC VALVE AV Area (Vmax):    2.34 cm AV Area (Vmean):   2.45 cm AV Area (VTI):     2.58 cm AV Vmax:           106.00 cm/s AV Vmean:          71.500 cm/s AV VTI:            0.175 m AV Peak Grad:      4.5 mmHg AV Mean Grad:      2.0 mmHg LVOT Vmax:         87.50 cm/s LVOT Vmean:        61.700 cm/s LVOT VTI:          0.159 m LVOT/AV VTI ratio: 0.91  AORTA Ao Root diam: 3.40 cm MITRAL VALVE MV Area (PHT): 3.23 cm    SHUNTS MV Decel Time: 235 msec    Systemic VTI:  0.16 m MV E velocity: 50.10 cm/s  Systemic Diam: 1.90 cm MV A velocity: 68.10 cm/s MV E/A ratio:  0.74 Kardie Tobb DO Electronically signed by Thomasene Ripple DO Signature Date/Time: 05/27/2023/1:50:36 PM    Final     Cardiac Studies   Cardiac  catheterization/PCI and stent (05/27/23)  Conclusion      Prox RCA to Mid RCA lesion is 20% stenosed.   Dist RCA lesion is 99% stenosed.   Ost Cx to Prox Cx lesion is 40% stenosed.   1st Mrg lesion is 50% stenosed.   Prox LAD lesion is 80% stenosed.   A drug-eluting stent was successfully placed using a SYNERGY XD 2.50X16.   Post intervention, there is a 0% residual stenosis.   Acute inferior STEMI secondary to severe distal RCA stenosis Successful PTCA/DES x 1 distal RCA  Severe proximal LAD stenosis Moderate stenosis in the proximal Circumflex and obtuse marginal branch LVEDP=25 mmHg   Recommendations: Will admit to the ICU. Aggrastat infusion for 2 hours. Lasix 40 mg IV x 1 in ICU. DAPT with ASA/Brilinta for one year. High intensity statin. Echo later today. Will need staged PCI of the proximal LAD next week.   Diagrams  Diagnostic Dominance: Right  Intervention      Patient Profile     Mr. Jaramiah Bossard is a 67 year old male with no significant past medical history who presented to [facility] with chest pain on 10/3 with reportedly negative evaluation and was discharged home. He then presented to [facility] on 10/5 with an inferior STEMI and was brought to the Cath Lab by [name]. He underwent PCI to RCA. He was also found to have 80% prox LAD stenosis.   Assessment & Plan    #. Inferior STEMI: Status post RCA intervention with a 2.5 x 16 mm long Synergy drug-eluting stent postdilated 2.75 mm. He was on Aggrastat infusion.  He is on DAPT with aspirin and Brilinta with plans to transition to Effient as an outpatient.  He has 80% proximal LAD lesion with plans for staged PCI early Monday morning. - Continue aspirin and ticagrelor. - Continue atorvastatin.  - LAD PCI on Monday. - NPO after midnight.  #: Hyperlipidemia: total cholesterol 351 with an LDL of 231 not on statin therapy.   - Started on atorvastatin.  Will give 500cc of NS, then he can be  transferred to a regular telemetry bed.  Informed Consent   Shared Decision Making/Informed Consent The risks [stroke (1 in 1000), death (1 in 1000), kidney failure [usually temporary] (1 in 500), bleeding (1 in 200), allergic reaction [possibly serious] (1 in 200)], benefits (diagnostic support and management of coronary artery disease) and alternatives of a cardiac catheterization were discussed in detail with Mr. Gieselman and he is willing to proceed.     Critical care time was exclusive of separate billable procedures and treating other patients.  Critial care time was spent personally by me (independant of midlevel providers) on the following activities: development of treatment plan with patient and/or surrogate as well as nursing, discussions with consultants, evaluation of patients response to treatment, examining patient, obtaining history from patient or surrogate, ordering/ reviewing treatments/ interventions, lab studies, radiographic studies, pulse ox, and re-evaluation of patients condition.     The patient is critically ill and requires high complexity decision making for assessment and support, frequent evaluation and titration of therapies, application of advanced monitoring technologies and extensive interpretation of databases.   Critical care was necessary to treat or prevent immintent or life-threatening deterioration.  Total CCT spent directly with the patient today is 30 minutes.  For questions or updates, please contact [facility] Please consult [email or web address] for contact info under     Signed, Nobie Putnam, MD  05/29/2023, 8:32 AM

## 2023-05-29 NOTE — Plan of Care (Signed)

## 2023-05-30 ENCOUNTER — Encounter (HOSPITAL_COMMUNITY): Payer: Self-pay | Admitting: Cardiovascular Disease

## 2023-05-30 ENCOUNTER — Encounter (HOSPITAL_COMMUNITY)
Admission: EM | Disposition: A | Discharge status: Home / Self Care | Payer: Self-pay | Source: Home / Self Care | Attending: Cardiovascular Disease

## 2023-05-30 DIAGNOSIS — E785 Hyperlipidemia, unspecified: Secondary | ICD-10-CM | POA: Diagnosis present

## 2023-05-30 DIAGNOSIS — I10 Essential (primary) hypertension: Secondary | ICD-10-CM | POA: Diagnosis present

## 2023-05-30 DIAGNOSIS — Z955 Presence of coronary angioplasty implant and graft: Secondary | ICD-10-CM

## 2023-05-30 DIAGNOSIS — E7800 Pure hypercholesterolemia, unspecified: Secondary | ICD-10-CM | POA: Diagnosis present

## 2023-05-30 DIAGNOSIS — I25110 Atherosclerotic heart disease of native coronary artery with unstable angina pectoris: Secondary | ICD-10-CM

## 2023-05-30 HISTORY — PX: CORONARY STENT INTERVENTION: CATH118234

## 2023-05-30 LAB — CBC
HCT: 34 % — ABNORMAL LOW (ref 39.0–52.0)
Hemoglobin: 11.1 g/dL — ABNORMAL LOW (ref 13.0–17.0)
MCH: 29.2 pg (ref 26.0–34.0)
MCHC: 32.6 g/dL (ref 30.0–36.0)
MCV: 89.5 fL (ref 80.0–100.0)
Platelets: 208 10*3/uL (ref 150–400)
RBC: 3.8 MIL/uL — ABNORMAL LOW (ref 4.22–5.81)
RDW: 13.2 % (ref 11.5–15.5)
WBC: 6.1 10*3/uL (ref 4.0–10.5)
nRBC: 0 % (ref 0.0–0.2)

## 2023-05-30 LAB — BASIC METABOLIC PANEL
Anion gap: 7 (ref 5–15)
BUN: 18 mg/dL (ref 8–23)
CO2: 26 mmol/L (ref 22–32)
Calcium: 8.6 mg/dL — ABNORMAL LOW (ref 8.9–10.3)
Chloride: 103 mmol/L (ref 98–111)
Creatinine, Ser: 0.96 mg/dL (ref 0.61–1.24)
GFR, Estimated: >60 mL/min (ref >60)
Glucose, Bld: 113 mg/dL — ABNORMAL HIGH (ref 70–99)
Potassium: 3.9 mmol/L (ref 3.5–5.1)
Sodium: 136 mmol/L (ref 135–145)

## 2023-05-30 LAB — LIPOPROTEIN A (LPA): Lipoprotein (a): 8.9 nmol/L (ref <75.0)

## 2023-05-30 LAB — POCT ACTIVATED CLOTTING TIME: Activated Clotting Time: 305 s

## 2023-05-30 SURGERY — CORONARY STENT INTERVENTION
Anesthesia: LOCAL

## 2023-05-30 MED ORDER — LOSARTAN POTASSIUM 25 MG PO TABS
25.0000 mg | ORAL_TABLET | Freq: Every evening | ORAL | Status: DC
  Filled 2023-05-30: qty 1

## 2023-05-30 MED ORDER — SODIUM CHLORIDE 0.9 % WEIGHT BASED INFUSION: 1.0000 mL/kg/h | INTRAVENOUS | Status: AC

## 2023-05-30 MED ORDER — FENTANYL CITRATE (PF) 100 MCG/2ML IJ SOLN
INTRAMUSCULAR | Status: DC | PRN
  Administered 2023-05-30 (×3): 50 ug via INTRAVENOUS

## 2023-05-30 MED ORDER — ACETAMINOPHEN 325 MG PO TABS: 650.0000 mg | ORAL_TABLET | ORAL | Status: DC | PRN

## 2023-05-30 MED ORDER — TICAGRELOR 90 MG PO TABS
90.0000 mg | ORAL_TABLET | Freq: Two times a day (BID) | ORAL | Status: DC
  Administered 2023-05-30: 90 mg via ORAL
  Filled 2023-05-30: qty 1

## 2023-05-30 MED ORDER — HEPARIN (PORCINE) IN NACL 1000-0.9 UT/500ML-% IV SOLN
INTRAVENOUS | Status: DC | PRN
  Administered 2023-05-30 (×2): 500 mL

## 2023-05-30 MED ORDER — MIDAZOLAM HCL 2 MG/2ML IJ SOLN
INTRAMUSCULAR | Status: DC | PRN
  Administered 2023-05-30: 2 mg via INTRAVENOUS

## 2023-05-30 MED ORDER — SODIUM CHLORIDE 0.9 % IV SOLN: 250.0000 mL | INTRAVENOUS | Status: DC | PRN

## 2023-05-30 MED ORDER — METOPROLOL SUCCINATE ER 25 MG PO TB24: 25.0000 mg | ORAL_TABLET | Freq: Every day | ORAL | Status: DC

## 2023-05-30 MED ORDER — LIDOCAINE HCL (PF) 1 % IJ SOLN
INTRAMUSCULAR | Status: DC | PRN
  Administered 2023-05-30: 2 mL

## 2023-05-30 MED ORDER — ASPIRIN 81 MG PO CHEW
81.0000 mg | CHEWABLE_TABLET | Freq: Every day | ORAL | Status: DC
  Administered 2023-05-31: 81 mg via ORAL
  Filled 2023-05-30: qty 1

## 2023-05-30 MED ORDER — MIDAZOLAM HCL 2 MG/2ML IJ SOLN
INTRAMUSCULAR | Status: AC
  Filled 2023-05-30: qty 2

## 2023-05-30 MED ORDER — NITROGLYCERIN 1 MG/10 ML FOR IR/CATH LAB
INTRA_ARTERIAL | Status: AC
  Filled 2023-05-30: qty 10

## 2023-05-30 MED ORDER — VERAPAMIL HCL 2.5 MG/ML IV SOLN
INTRAVENOUS | Status: DC | PRN
  Administered 2023-05-30: 10 mL via INTRA_ARTERIAL

## 2023-05-30 MED ORDER — SODIUM CHLORIDE 0.9% FLUSH: 3.0000 mL | INTRAVENOUS | Status: DC | PRN

## 2023-05-30 MED ORDER — VERAPAMIL HCL 2.5 MG/ML IV SOLN
INTRAVENOUS | Status: AC
  Filled 2023-05-30: qty 2

## 2023-05-30 MED ORDER — ONDANSETRON HCL 4 MG/2ML IJ SOLN: 4.0000 mg | Freq: Four times a day (QID) | INTRAMUSCULAR | Status: DC | PRN

## 2023-05-30 MED ORDER — LIDOCAINE HCL (PF) 1 % IJ SOLN
INTRAMUSCULAR | Status: AC
  Filled 2023-05-30: qty 30

## 2023-05-30 MED ORDER — HEPARIN SODIUM (PORCINE) 5000 UNIT/ML IJ SOLN
5000.0000 [IU] | Freq: Three times a day (TID) | INTRAMUSCULAR | Status: DC
  Administered 2023-05-30 – 2023-05-31 (×2): 5000 [IU] via SUBCUTANEOUS
  Filled 2023-05-30 (×2): qty 1

## 2023-05-30 MED ORDER — HYDRALAZINE HCL 20 MG/ML IJ SOLN: 5.0000 mg | INTRAMUSCULAR | Status: AC | PRN

## 2023-05-30 MED ORDER — NITROGLYCERIN 0.2 MG/ML ON CALL CATH LAB
INTRAVENOUS | Status: AC
  Filled 2023-05-30: qty 1

## 2023-05-30 MED ORDER — NITROGLYCERIN 0.4 MG SL SUBL: 0.4000 mg | SUBLINGUAL_TABLET | SUBLINGUAL | Status: DC | PRN

## 2023-05-30 MED ORDER — SODIUM CHLORIDE 0.9% FLUSH
3.0000 mL | Freq: Two times a day (BID) | INTRAVENOUS | Status: DC
  Administered 2023-05-30 – 2023-05-31 (×2): 3 mL via INTRAVENOUS

## 2023-05-30 MED ORDER — CHLORHEXIDINE GLUCONATE CLOTH 2 % EX PADS
6.0000 | MEDICATED_PAD | Freq: Every day | CUTANEOUS | Status: DC
  Administered 2023-05-30 – 2023-05-31 (×2): 6 via TOPICAL

## 2023-05-30 MED ORDER — SODIUM CHLORIDE 0.9% FLUSH
3.0000 mL | Freq: Two times a day (BID) | INTRAVENOUS | Status: DC
  Administered 2023-05-30: 3 mL via INTRAVENOUS

## 2023-05-30 MED ORDER — HEPARIN SODIUM (PORCINE) 1000 UNIT/ML IJ SOLN
INTRAMUSCULAR | Status: DC | PRN
  Administered 2023-05-30: 8000 [IU] via INTRAVENOUS

## 2023-05-30 MED ORDER — LABETALOL HCL 5 MG/ML IV SOLN: 10.0000 mg | INTRAVENOUS | Status: AC | PRN

## 2023-05-30 MED ORDER — FENTANYL CITRATE (PF) 100 MCG/2ML IJ SOLN
INTRAMUSCULAR | Status: AC
  Filled 2023-05-30: qty 2

## 2023-05-30 MED ORDER — ATORVASTATIN CALCIUM 80 MG PO TABS
80.0000 mg | ORAL_TABLET | Freq: Every day | ORAL | Status: DC
  Administered 2023-05-31: 80 mg via ORAL
  Filled 2023-05-30: qty 1

## 2023-05-30 MED ORDER — NITROGLYCERIN 1 MG/10 ML FOR IR/CATH LAB
INTRA_ARTERIAL | Status: DC | PRN
  Administered 2023-05-30: 200 ug via INTRACORONARY

## 2023-05-30 MED ORDER — HEPARIN SODIUM (PORCINE) 1000 UNIT/ML IJ SOLN
INTRAMUSCULAR | Status: AC
  Filled 2023-05-30: qty 10

## 2023-05-30 MED ORDER — IOHEXOL 350 MG/ML SOLN
INTRAVENOUS | Status: DC | PRN
  Administered 2023-05-30: 125 mL via INTRA_ARTERIAL

## 2023-05-30 MED FILL — Nitroglycerin IV Soln 100 MCG/ML in D5W: INTRA_ARTERIAL | Qty: 10 | Status: AC

## 2023-05-30 SURGICAL SUPPLY — 18 items
BALLN EUPHORA RX 2.5X25 (BALLOONS) ×1
BALLOON EUPHORA RX 2.5X25 (BALLOONS) IMPLANT
CATH VISTA GUIDE 6FR XB3.5 (CATHETERS) IMPLANT
DEVICE RAD COMP TR BAND LRG (VASCULAR PRODUCTS) IMPLANT
GLIDESHEATH SLEND A-KIT 6F 22G (SHEATH) IMPLANT
GUIDEWIRE ANGLED .035X150CM (WIRE) IMPLANT
GUIDEWIRE INQWIRE 1.5J.035X260 (WIRE) IMPLANT
INQWIRE 1.5J .035X260CM (WIRE) ×1
KIT ENCORE 26 ADVANTAGE (KITS) IMPLANT
KIT SYRINGE INJ CVI SPIKEX1 (MISCELLANEOUS) IMPLANT
PACK CARDIAC CATHETERIZATION (CUSTOM PROCEDURE TRAY) ×1 IMPLANT
PROTECTION STATION PRESSURIZED (MISCELLANEOUS) ×1
SET ATX-X65L (MISCELLANEOUS) IMPLANT
STATION PROTECTION PRESSURIZED (MISCELLANEOUS) IMPLANT
STENT SYNERGY XD 3.0X28 (Permanent Stent) IMPLANT
SYNERGY XD 3.0X28 (Permanent Stent) ×1 IMPLANT
TUBING CIL FLEX 10 FLL-RA (TUBING) IMPLANT
WIRE RUNTHROUGH .014X180CM (WIRE) IMPLANT

## 2023-05-30 NOTE — Interval H&P Note (Signed)
History and Physical Interval Note:  05/30/2023 10:03 AM  [name]  has presented today for surgery, with the diagnosis of CAD.  The various methods of treatment have been discussed with the patient and family. After consideration of risks, benefits and other options for treatment, the patient has consented to  Procedure(s): CORONARY STENT INTERVENTION (N/A) as a surgical intervention.  The patient's history has been reviewed, patient examined, no change in status, stable for surgery.  I have reviewed the patient's chart and labs.  Questions were answered to the patient's satisfaction.     Juan Schultz

## 2023-05-30 NOTE — Progress Notes (Signed)
Discussed with pt and wife restrictions, Effient, NTG, diet, exercise, and CRPII. Receptive. Currently still decreasing air in TR band. Will ambulate with RN later.  [id number] Ethelda Chick BS, ACSM-CEP 05/30/2023 1:42 PM

## 2023-05-30 NOTE — Progress Notes (Signed)
CARDIAC REHAB PHASE I   PRE:  Rate/Rhythm: 89   BP:  Supine: 98/78     SaO2: 100   MODE:  Ambulation: 740 ft   POST:  Rate/Rhythem: 90  BP: Sitting: 118/59    SaO2: 100  Juan Schultz

## 2023-05-30 NOTE — Plan of Care (Signed)

## 2023-05-30 NOTE — Progress Notes (Signed)
Patient Name: [name] Date of Encounter: 05/30/2023  [facility] Cardiologist: Verne Carrow, MD    Patient Profile.    [name] is a 67 year old male with no significant past medical history who presented to [facility] with chest pain on 10/3 with reportedly negative evaluation and was discharged home. He then presented to [facility] early a.m. on 10/4 with an inferior STEMI and was brought to the Cath Lab by [name]. He underwent PCI to RCA. He was also found to have 80% prox LAD stenosis.   Interval Summary  .    Doing well.  Some mild twinges of chest discomfort, but nothing significant nothing like his MI pain.  Was able to walk 2 laps around the nursing station twice yesterday.  No dyspnea.  Assessment & Plan .     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 [facility] consulted  Active Problems:     Hyperlipidemia with target low density lipoprotein (LDL) cholesterol less than 55 mg/dL TC 161 and LDL 096 prior to statin therapy, suspect that he will probably need PCSK9 inhibitor following discharge. => Early TOC consult to lipid clinic placed  Borderline pressures today.  Would not tolerate initiation of medications for now. A1c 5.8 - borderline DM-2 -monitor for now with dietary adjustment . Dispo - Monitor  overnight post PCI of LAD to determine stable BP & anticipate d/c in AM (Could transfer to Tele post PCI) -- Needs to work with [facility] prior to d/c - Phase II consult  -> needs FMLA paperwork   Vital Signs .    Vitals:   05/30/23 0400 05/30/23 0500 05/30/23 0600 05/30/23 0700  BP: 98/68 (!) 88/74 101/72 90/66  Pulse: 77 70 69 73  Resp: 13 12 13 15   Temp:      TempSrc:      SpO2: 94% 95% 97% 95%  Weight:      Height:        Intake/Output Summary (Last 24 hours) at 05/30/2023 0855 Last data filed at 05/30/2023 0300 Gross per 24 hour  Intake 907.74 ml  Output 950 ml  Net -42.26 ml      05/30/2023    3:00 AM 05/29/2023    4:00 AM 05/27/2023    4:08 AM  Last 3 Weights  Weight (lbs) 145 lb 1 oz 141 lb 15.6 oz 145 lb  Weight (kg) 65.8 kg 64.4 kg 65.772 kg      Telemetry/ECG    NSR  - Personally Reviewed  No new EKG  Cardiac Studies   Cardiac Cath-PCI 05/27/2023: p-mRCA 20%, dRCA 99% (thrombotic) => DES PCI Synergy XD 2.5 x 16; proximal LAD 80% (planned staged PCI); Ost-prox LCx 40% and OM1 50%; LVEDP 25 mmHg. TTE post MI 05/27/23: EF 55 to 60%.  No obvious RWMA.  Mild concentric LVH.  Indeterminate filling pressures.  Normal RV and  RAP.  Normal valves.  Physical Exam .    GEN: No acute distress.  Resting comfortably. Neck: No JVD Cardiac: RRR, no murmurs, rubs, or gallops.  Has bruising at both the radial and femoral cath sites but no bruit. Respiratory: Clear to auscultation bilaterally. GI: Soft, nontender, non-distended  MS: No edema   For questions or updates, please contact [facility] Please consult [email or web address] for contact info under        Signed, Bryan Lemma, MD

## 2023-05-30 NOTE — TOC CM/SW Note (Signed)
Transition of Care [facility]) - Inpatient Brief Assessment   Patient Details  Name: Juan Schultz MRN: [id number] Date of Birth: [birth year]  Transition of Care [facility]) CM/SW Contact:    Gala Lewandowsky, RN Phone Number: 05/30/2023, 4:16 PM   Clinical Narrative: Patient presented for Stemi-post stent. Patient has insurance and PCP. Benefits check submitted for Effient. Case Manager will continue to follow for transition of care needs as the patient progresses.  Transition of Care Asessment: Insurance and Status: Insurance coverage has been reviewed Patient has primary care physician: Yes Prior/Current Home Services: No current home services Social Determinants of Health Reivew: SDOH reviewed no interventions necessary Readmission risk has been reviewed: Yes Transition of care needs: no transition of care needs at this time

## 2023-05-30 NOTE — H&P (View-Only) (Signed)
Patient Name: Juan Schultz Date of Encounter: 05/30/2023  [facility] Cardiologist: Verne Carrow, MD    Patient Profile.    [name] is a 67 year old male with no significant past medical history who presented to [facility] with chest pain on 10/3 with reportedly negative evaluation and was discharged home. He then presented to [facility] early a.m. on 10/4 with an inferior STEMI and was brought to the Cath Lab by [name]. He underwent PCI to RCA. He was also found to have 80% prox LAD stenosis.   Interval Summary  .    Doing well.  Some mild twinges of chest discomfort, but nothing significant nothing like his MI pain.  Was able to walk 2 laps around the nursing station twice yesterday.  No dyspnea.  Assessment & Plan .     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 [facility] consulted  Active Problems:     Hyperlipidemia with target low density lipoprotein (LDL) cholesterol less than 55 mg/dL TC 161 and LDL 096 prior to statin therapy, suspect that he will probably need PCSK9 inhibitor following discharge. => Early TOC consult to lipid clinic placed  Borderline pressures today.  Would not tolerate initiation of medications for now. A1c 5.8 - borderline DM-2 -monitor for now with dietary adjustment . Dispo - Monitor  overnight post PCI of LAD to determine stable BP & anticipate d/c in AM (Could transfer to Tele post PCI) -- Needs to work with [facility] prior to d/c - Phase II consult  -> needs FMLA paperwork   Vital Signs .    Vitals:   05/30/23 0400 05/30/23 0500 05/30/23 0600 05/30/23 0700  BP: 98/68 (!) 88/74 101/72 90/66  Pulse: 77 70 69 73  Resp: 13 12 13 15   Temp:      TempSrc:      SpO2: 94% 95% 97% 95%  Weight:      Height:        Intake/Output Summary (Last 24 hours) at 05/30/2023 0855 Last data filed at 05/30/2023 0300 Gross per 24 hour  Intake 907.74 ml  Output 950 ml  Net -42.26 ml      05/30/2023    3:00 AM 05/29/2023    4:00 AM 05/27/2023    4:08 AM  Last 3 Weights  Weight (lbs) 145 lb 1 oz 141 lb 15.6 oz 145 lb  Weight (kg) 65.8 kg 64.4 kg 65.772 kg      Telemetry/ECG    NSR  - Personally Reviewed  No new EKG  Cardiac Studies   Cardiac Cath-PCI 05/27/2023: p-mRCA 20%, dRCA 99% (thrombotic) => DES PCI Synergy XD 2.5 x 16; proximal LAD 80% (planned staged PCI); Ost-prox LCx 40% and OM1 50%; LVEDP 25 mmHg. TTE post MI 05/27/23: EF 55 to 60%.  No obvious RWMA.  Mild concentric LVH.  Indeterminate filling pressures.  Normal RV and  RAP.  Normal valves.  Physical Exam .    GEN: No acute distress.  Resting comfortably. Neck: No JVD Cardiac: RRR, no murmurs, rubs, or gallops.  Has bruising at both the radial and femoral cath sites but no bruit. Respiratory: Clear to auscultation bilaterally. GI: Soft, nontender, non-distended  MS: No edema   For questions or updates, please contact [facility] Please consult [email or web address] for contact info under        Signed, Bryan Lemma, MD

## 2023-05-31 ENCOUNTER — Encounter (HOSPITAL_COMMUNITY): Payer: Self-pay | Admitting: Cardiology

## 2023-05-31 ENCOUNTER — Other Ambulatory Visit (HOSPITAL_COMMUNITY): Payer: Self-pay

## 2023-05-31 DIAGNOSIS — Z955 Presence of coronary angioplasty implant and graft: Secondary | ICD-10-CM

## 2023-05-31 LAB — BASIC METABOLIC PANEL
Anion gap: 7 (ref 5–15)
BUN: 13 mg/dL (ref 8–23)
CO2: 26 mmol/L (ref 22–32)
Calcium: 8.8 mg/dL — ABNORMAL LOW (ref 8.9–10.3)
Chloride: 104 mmol/L (ref 98–111)
Creatinine, Ser: 0.88 mg/dL (ref 0.61–1.24)
GFR, Estimated: >60 mL/min (ref >60)
Glucose, Bld: 106 mg/dL — ABNORMAL HIGH (ref 70–99)
Potassium: 4.2 mmol/L (ref 3.5–5.1)
Sodium: 137 mmol/L (ref 135–145)

## 2023-05-31 LAB — CBC
HCT: 32.9 % — ABNORMAL LOW (ref 39.0–52.0)
Hemoglobin: 10.7 g/dL — ABNORMAL LOW (ref 13.0–17.0)
MCH: 28.6 pg (ref 26.0–34.0)
MCHC: 32.5 g/dL (ref 30.0–36.0)
MCV: 88 fL (ref 80.0–100.0)
Platelets: 233 10*3/uL (ref 150–400)
RBC: 3.74 MIL/uL — ABNORMAL LOW (ref 4.22–5.81)
RDW: 13.2 % (ref 11.5–15.5)
WBC: 6.4 10*3/uL (ref 4.0–10.5)
nRBC: 0 % (ref 0.0–0.2)

## 2023-05-31 MED ORDER — LOSARTAN POTASSIUM 25 MG PO TABS: 25.0000 mg | ORAL_TABLET | Freq: Every day | ORAL | Status: DC

## 2023-05-31 MED ORDER — ASPIRIN 81 MG PO TBEC
81.0000 mg | DELAYED_RELEASE_TABLET | Freq: Every day | ORAL | 3 refills | Status: AC
  Filled 2023-05-31: qty 90, 90d supply, fill #0

## 2023-05-31 MED ORDER — LOSARTAN POTASSIUM 25 MG PO TABS
12.5000 mg | ORAL_TABLET | Freq: Every day | ORAL | Status: DC
  Administered 2023-05-31: 12.5 mg via ORAL
  Filled 2023-05-31: qty 1

## 2023-05-31 MED ORDER — LOSARTAN POTASSIUM 25 MG PO TABS
12.5000 mg | ORAL_TABLET | Freq: Every day | ORAL | 3 refills | Status: DC
  Filled 2023-05-31: qty 15, 30d supply, fill #0

## 2023-05-31 MED ORDER — METOPROLOL SUCCINATE ER 25 MG PO TB24
12.5000 mg | ORAL_TABLET | Freq: Every day | ORAL | Status: DC
  Administered 2023-05-31: 12.5 mg via ORAL
  Filled 2023-05-31: qty 1

## 2023-05-31 MED ORDER — ATORVASTATIN CALCIUM 80 MG PO TABS
80.0000 mg | ORAL_TABLET | Freq: Every day | ORAL | 3 refills | Status: DC
Start: 2023-05-31 — End: 2023-06-13
  Filled 2023-05-31: qty 30, 30d supply, fill #0

## 2023-05-31 MED ORDER — NITROGLYCERIN 0.4 MG SL SUBL
0.4000 mg | SUBLINGUAL_TABLET | SUBLINGUAL | 3 refills | Status: DC | PRN
  Filled 2023-05-31: qty 25, 5d supply, fill #0

## 2023-05-31 MED ORDER — PRASUGREL HCL 10 MG PO TABS: 10.0000 mg | ORAL_TABLET | Freq: Every day | ORAL | Status: DC

## 2023-05-31 MED ORDER — METOPROLOL SUCCINATE ER 25 MG PO TB24
12.5000 mg | ORAL_TABLET | Freq: Every day | ORAL | 3 refills | Status: DC
  Filled 2023-05-31: qty 15, 30d supply, fill #0

## 2023-05-31 MED ORDER — PRASUGREL HCL 10 MG PO TABS
10.0000 mg | ORAL_TABLET | Freq: Every day | ORAL | 3 refills | Status: DC
  Filled 2023-05-31: qty 30, 30d supply, fill #0

## 2023-05-31 MED ORDER — PRASUGREL HCL 10 MG PO TABS
60.0000 mg | ORAL_TABLET | Freq: Once | ORAL | Status: AC
  Administered 2023-05-31: 60 mg via ORAL
  Filled 2023-05-31: qty 6

## 2023-05-31 NOTE — Discharge Summary (Addendum)
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   Ost Cx to Prox Cx lesion is 40% stenosed.   Prox RCA to Mid RCA lesion is 20% stenosed.   Prox LAD to Mid LAD lesion is 80% stenosed.   Non-stenotic Dist RCA lesion was previously treated.   A drug-eluting stent was successfully placed using a SYNERGY XD 3.0X28.   Post intervention, there is a 0% residual stenosis.   Recommend uninterrupted dual antiplatelet therapy with Aspirin 81mg  daily and Ticagrelor 90mg  twice daily for a minimum of 12 months (ACS-Class I recommendation).   Coronary angioplasty 05/30/2023: Patient with acute inferior MI, 2.5 x 16 mm stent placed in the distal RCA on 05/27/2023.  Stage intervention to LAD. Successful PTCA and stenting of the proximal LAD implantation of 3.0 x 28 mm Synergy XD DES, stenosis reduced from 80% to 0% with TIMI-3 to TIMI-3 flow.    Recommendation: DAPT for 1 year, continued aggressive risk modification.  Patient can potentially be discharged home today if he remains stable.  Outpatient cardiac rehab. _____________   [facility] 05/27/23   Prox RCA to Mid RCA lesion is 20% stenosed.   Dist RCA lesion is 99% stenosed.   Ost Cx to Prox Cx lesion is 40% stenosed.   1st Mrg lesion is 50% stenosed.   Prox LAD lesion is 80% stenosed.    A drug-eluting stent was successfully placed using a SYNERGY XD 2.50X16.   Post intervention, there is a 0% residual stenosis.   Acute inferior STEMI secondary to severe distal RCA stenosis Successful PTCA/DES x 1 distal RCA Severe proximal LAD stenosis Moderate stenosis in the proximal Circumflex and obtuse marginal branch LVEDP=25 mmHg   Recommendations: Will admit to the ICU. Aggrastat infusion for 2 hours. Lasix 40 mg IV x 1 in ICU. DAPT with ASA/Brilinta for one year. High intensity statin. Echo later today. Will need staged PCI of the proximal LAD next week.  _____________  Echo 05/27/23: 1. Left ventricular ejection fraction, by estimation, is 55 to 60%. The  left ventricle has normal function. The left ventricle has no regional  wall motion abnormalities. There is mild concentric left ventricular  hypertrophy. Left ventricular diastolic  parameters are indeterminate.   2. Right ventricular systolic function is normal. The right ventricular  size is normal. Tricuspid regurgitation signal is inadequate for assessing  PA pressure.   3. The mitral valve is normal in structure. No evidence of mitral valve  regurgitation. No evidence of mitral stenosis.   4. The aortic valve is normal in structure. Aortic valve regurgitation is  not visualized. No aortic stenosis is present.   5. The inferior vena cava is normal in size with greater than 50%  respiratory variability, suggesting right atrial pressure of 3 mmHg.   History of Present Illness     Juan Schultz  is a 67 y.o. male with no prior medical history who presented to [facility] ED with c/o chest pain last night around 10pm. Troponin negative x 2 and EKG without ischemic changes. His chest pain resolved and he was discharged home. He presented back to the [facility] ED around 4 am with recurrent severe chest pain. EKG with inferior ST elevation c/w acute MI. Code STEMI activated by ED staff. Pt transported to [facility] for emergent  cardiac cath. Pt is chest pain free on arrival to [facility].   Hospital Course     Consultants: none  Acute inferior STEMI Pt was emergently taken to the cath lab for revascularization. LHC found 99% distal RCA stenosis successfully treated with PCI and DES 2.5 x 16 mm. Proximal LAD with 80% stenosis and 40% ostial-proximal LCX stenosis. He was brought back to the lab for staged PCI to LAD successfully treated with DES 3.0 x 28 mm. He was placed on ASA and brilinta; however due to cost (insurance only covers effient), he was loaded with effient with plans for ASA and effienct x 12 months in the setting of STEMI. BP marginal, but was eventually able to start 12.5 mg losartan and 12.5 mg toprol. Continue 80 mg lipitor.   Echo with preserved at 55-60%, mild LVH, and no significant valvular disease.  Patient did not have any heart failure symptoms.   Hyperlipidemia with LDL goal < 70 05/27/2023: Cholesterol 351; HDL 58; LDL Cholesterol 231; Triglycerides 312; VLDL 62 LP 9 (a) 9 Started on 80 mg lipitor. Will need PCSK9i. Lipid clinic referral sent.    Prediabetes A1c 5.8%. counseled on dietary adjustments.   Pt seen and examined by Dr. Herbie Baltimore and deemed stable for discharge. Follow up has been arranged.   See final progress for full details .       Did the patient have an acute coronary syndrome (MI, NSTEMI, STEMI, etc) this admission?:  Yes                               AHA/ACC ACS Clinical Performance & Quality Measures: Aspirin prescribed? - Yes ADP Receptor Inhibitor (Plavix/Clopidogrel, Brilinta/Ticagrelor or Effient/Prasugrel) prescribed (includes medically managed patients)? - Yes Beta Blocker prescribed? - Yes High Intensity Statin (Lipitor 40-80mg  or Crestor 20-40mg ) prescribed? - Yes EF assessed during THIS hospitalization? - Yes For EF <40%, was ACEI/ARB prescribed? - Yes For EF <40%, Aldosterone Antagonist (Spironolactone or Eplerenone) prescribed? - Not Applicable (EF >/=  40%) Cardiac Rehab Phase II ordered (including medically managed patients)? - Yes       The patient will be scheduled for a TOC follow up appointment in 7-14 days.  A message has been sent to the [facility] and Scheduling Pool at the office where the patient should be seen for follow up.  _____________   Physical Exam .     Discharge Vitals Blood pressure 99/69, pulse 88, temperature 98.4 F (36.9 C), temperature source Oral, resp. rate 14, height 5' 6.5" (1.689 m), weight 65.8 kg, SpO2 94%.  Filed Weights   05/27/23 0408 05/29/23 0400 05/30/23 0300  Weight: 65.8 kg 64.4 kg 65.8 kg   GEN: No acute distress.  Resting comfortably. Neck: No JVD Cardiac: RRR, no murmurs, rubs, or gallops.  Has bruising at both the radial and femoral cath sites but no bruit. Respiratory: Clear to auscultation bilaterally. GI: Soft, nontender, non-distended  MS: No edema  Labs & Radiologic Studies  CBC Recent Labs    05/30/23 0301 05/31/23 0302  WBC 6.1 6.4  HGB 11.1* 10.7*  HCT 34.0* 32.9*  MCV 89.5 88.0  PLT 208 233   Basic Metabolic Panel Recent Labs    16/10/96 0301 05/31/23 0302  NA 136 137  K 3.9 4.2  CL 103 104  CO2 26 26  GLUCOSE 113* 106*  BUN 18 13  CREATININE 0.96 0.88  CALCIUM 8.6* 8.8*   Liver Function Tests No results for input(s): "AST", "ALT", "ALKPHOS", "BILITOT", "PROT", "ALBUMIN" in the last 72 hours. No results for input(s): "LIPASE", "AMYLASE" in the last 72 hours. High Sensitivity Troponin:   Recent Labs  Lab 05/26/23 2251 05/27/23 0045 05/27/23 0428 05/27/23 0949  TROPONINIHS 3 6 9  >24,000*    BNP Invalid input(s): "POCBNP" D-Dimer No results for input(s): "DDIMER" in the last 72 hours. Hemoglobin A1C No results for input(s): "HGBA1C" in the last 72 hours. Fasting Lipid Panel No results for input(s): "CHOL", "HDL", "LDLCALC", "TRIG", "CHOLHDL", "LDLDIRECT" in the last 72 hours. Thyroid Function Tests No results for input(s): "TSH", "T4TOTAL",  "T3FREE", "THYROIDAB" in the last 72 hours.  Invalid input(s): "FREET3" _____________   Disposition   Pt is being discharged home today in good condition.  Follow-up Plans & Appointments     Discharge Instructions     AMB Referral to Advanced Lipid Disorders Clinic   Complete by: As directed    Internal Lipid Clinic Referral Scheduling  Internal lipid clinic referrals are providers within [facility], who wish to refer established patients for routine management (help in starting PCSK9 inhibitor therapy) or advanced therapies.  Internal MD referral criteria:              1. All patients with LDL>190 mg/dL  2. All patients with Triglycerides >500 mg/dL  3. Patients with suspected or confirmed heterozygous familial hyperlipidemia (HeFH) or homozygous familial hyperlipidemia (HoFH)  4. Patients with family history of suspicious for genetic dyslipidemia desiring genetic testing  5. Patients refractory to standard guideline based therapy  6. Patients with statin intolerance (failed 2 statins, one of which must be a high potency statin)  7. Patients who the provider desires to be seen by MD   Internal PharmD referral criteria:   1. Follow-up patients for medication management  2. Follow-up for compliance monitoring  3. Patients for drug education  4. Patients with statin intolerance  5. PCSK9 inhibitor education and prior authorization approvals  6. Patients with triglycerides <500 mg/dL  External Lipid Clinic Referral  External lipid clinic referrals are for providers outside of [facility], considered new clinic patients - automatically routed to MD schedule   AMB Referral to Cardiac Rehabilitation - Phase II   Complete by: As directed    Diagnosis:  STEMI Coronary Stents     After initial evaluation and assessments completed: Virtual Based Care may be provided alone or in conjunction with Phase 2 Cardiac Rehab based on patient barriers.: Yes   Intensive Cardiac  Rehabilitation (ICR) [facility] location only OR Traditional Cardiac Rehabilitation (TCR) *If criteria for ICR are not met will enroll in TCR [facility] only): Yes        Discharge Medications   Allergies as of 05/31/2023       Reactions   Tequin [gatifloxacin]    Mouth got numb        Medication List     TAKE these medications    albuterol 108 (90 Base) MCG/ACT inhaler Commonly known as: VENTOLIN HFA Inhale 2  puffs into the lungs as needed for wheezing or shortness of breath.   aspirin EC 81 MG tablet Take 1 tablet (81 mg total) by mouth daily. Swallow whole.   atorvastatin 80 MG tablet Commonly known as: LIPITOR Take 1 tablet (80 mg total) by mouth daily.   BEET ROOT PO Take 1 tablet by mouth daily.   cetirizine 10 MG tablet Commonly known as: ZYRTEC Take 10 mg by mouth daily as needed for allergies.   CHONDROITIN SULFATE A PO Take 1 tablet by mouth daily.   COLLAGEN PO Take 1 tablet by mouth daily.   D3 PO Take 1 tablet by mouth daily.   losartan 25 MG tablet Commonly known as: COZAAR Take 0.5 tablets (12.5 mg total) by mouth daily.   metoprolol succinate 25 MG 24 hr tablet Commonly known as: TOPROL-XL Take 0.5 tablets (12.5 mg total) by mouth daily.   nitroGLYCERIN 0.4 MG SL tablet Commonly known as: NITROSTAT Place 1 tablet (0.4 mg total) under the tongue every 5 (five) minutes as needed for chest pain.   prasugrel 10 MG Tabs tablet Commonly known as: EFFIENT Take 1 tablet (10 mg total) by mouth daily. Start taking on: June 01, 2023           Outstanding Labs/Studies   Lipid clinic referral placed at discharge  Duration of Discharge Encounter   Greater than 30 minutes including physician time.  Signed, Roe Rutherford Duke, PA 05/31/2023, 10:07 AM   ATTENDING ATTESTATION  I have seen, examined and evaluated the patient this morning on rounds along with [name], PA.  After reviewing all the available data and chart, we discussed the  patients laboratory, study & physical findings as well as symptoms in detail.  I agree with her discharge summary.  Please see final progress note for full details of my findings on examination.  Attending adjustments noted in italics.   Kept overnight after staged PCI of the LAD for complete revascularization in order to allow for BP monitoring and additional work with cardiac rehab since he had not been evaluated educated while still pending PCI.  Additional evening allowed [place] to initiate beta-blocker and ARB.  His blood pressure remained little low but stayed stable after receiving his medications and stable for discharge home.  No heart failure symptoms.  No recurrent angina. He will need TOC follow-up as well as lipid clinic referral to expedite PCSK9 inhibitor initiation.    Marykay Lex, MD, MS Bryan Lemma, M.D., M.S. Interventional Cardiologist  [facility]  Pager # [phone] Phone # [phone] [address] [place], [place] [address]

## 2023-05-31 NOTE — Progress Notes (Signed)
Pt discharged home with wife. All discharge instructions were reviewed and all questions were answered. Medications were delivered by [facility] pharmacy. All belongings are with pt and pt is satisfied. Vital signs stable.

## 2023-05-31 NOTE — Progress Notes (Signed)
Patient Name: Juan Schultz Date of Encounter: 05/31/2023 [facility] Cardiologist: Verne Carrow, MD    Patient Profile.    [name] is a 67 year old male with no significant past medical history who presented to [facility] with chest pain on 10/3 with reportedly negative evaluation and was discharged home. He then presented to [facility] early a.m. on 10/4 with an inferior STEMI and was brought to the Cath Lab by [name]. He underwent PCI to RCA. He was also found to have 80% prox LAD stenosis.   Interval Summary  .    Doing well.  Some mild twinges of chest discomfort, but nothing significant nothing like his MI pain.  Was able to walk 2 laps around the nursing station twice yesterday.  No dyspnea.  Assessment & Plan .     Principal Problem:   Acute ST elevation myocardial infarction (STEMI) of inferior wall (HCC) Active Problems:   Coronary artery disease involving native coronary artery with unstable angina pectoris (HCC)   Presence of drug coated stent in right coronary artery   Hyperlipidemia with target low density lipoprotein (LDL) cholesterol less than 55 mg/dL  Principal Problem:   Acute ST elevation myocardial infarction (STEMI) of inferior wall (HCC) / Coronary artery disease involving native coronary artery with unstable angina pectoris (HCC) => Presence of drug coated stent in RCA - Residual Stenosis in LAD  DAPT with ASA & loaded on Brilinta => 2/2 Insurance only covering Effient, will load 60 mg this AM & plan to 10 mg Effient daily on d/c Atorvastatin 80 mg [facility] consulted  Active Problems:     Hyperlipidemia with target low density lipoprotein (LDL) cholesterol less than 55 mg/dL TC 295 and LDL 284 prior to statin therapy, WILL need PCSK9 inhibitor following discharge. => Early TOC consult to lipid clinic placed  Borderline pressures today.  Would not tolerate initiation of medications for now. Ordered low dose losartan  & Toprol post-cath -- will start 12.5 mg of both. -> anticipate further titration as OP. ; will need to see how he does after meds - to ensure stable for d/c A1c 5.8 - borderline DM-2 -monitor for now with dietary adjustment . Dispo - Need to see how he does with BP meds - did will with [facility].  Anticipate d/c TODAY FMLA paperwork completed.    Vital Signs .    Vitals:   05/31/23 0600 05/31/23 0700 05/31/23 0756 05/31/23 0800  BP: 96/63 (!) 124/57  99/69  Pulse: 75 91  88  Resp: 12 17  14   Temp:   98.4 F (36.9 C)   TempSrc:   Oral   SpO2: 94% 94%  94%  Weight:      Height:        Intake/Output Summary (Last 24 hours) at 05/31/2023 0912 Last data filed at 05/31/2023 0800 Gross per 24 hour  Intake 253.33 ml  Output 1200 ml  Net -946.67 ml      05/30/2023    3:00 AM 05/29/2023    4:00 AM 05/27/2023    4:08 AM  Last 3 Weights  Weight (lbs) 145 lb 1 oz 141 lb 15.6 oz 145 lb  Weight (kg) 65.8 kg 64.4 kg 65.772 kg      Telemetry/ECG    NSR  - Personally Reviewed  No new EKG  Cardiac Studies   Cardiac Cath-PCI 05/27/2023: p-mRCA 20%, dRCA 99% (thrombotic) => DES PCI Synergy XD 2.5 x 16; proximal  LAD 80% (planned staged PCI); Ost-prox LCx 40% and OM1 50%; LVEDP 25 mmHg. Staged PCI LAD 05/30/2023:- Synergy DES 3.0 x 28 TTE post MI 05/27/23: EF 55 to 60%.  No obvious RWMA.  Mild concentric LVH.  Indeterminate filling pressures.  Normal RV and RAP.  Normal valves.  Physical Exam .    GEN: No acute distress.  Resting comfortably. Neck: No JVD Cardiac: RRR, no murmurs, rubs, or gallops.  Has bruising at both the radial and femoral cath sites but no bruit. Respiratory: Clear to auscultation bilaterally. GI: Soft, nontender, non-distended  MS: No edema   For questions or updates, please contact [facility] Please consult [email or web address] for contact info under        Signed, Bryan Lemma, MD

## 2023-05-31 NOTE — Progress Notes (Signed)
CARDIAC REHAB PHASE I   Pt has just returned from walk in hallway with Laymond Purser. Reports tolerating well with no CP, dizziness or SOB. Post MI/stent education completed yesterday. All questions and concerns addressed. Referral sent to [facility] per pt request. Plan for home later today.   [id number]      Woodroe Chen, RN BSN 05/31/2023 12:14 PM

## 2023-06-06 NOTE — Progress Notes (Deleted)
Cardiology Office Note:    Date:  06/06/2023   ID:  [name], DOB Dec 28, 1955, MRN [id number]  PCP:  Assunta Found, MD   [facility] Providers Cardiologist:  Verne Carrow, MD { Click to update primary MD,subspecialty MD or APP then REFRESH:1}    Referring MD: Assunta Found, MD   No chief complaint on file. ***  History of Present Illness:    Juan Schultz is a 67 y.o. male with a hx of CAD with recent STEMI 05/2023.  He presented to [facility], ER with chest pain concerning for angina.  Troponin x 2 was negative and EKG was nonischemic.  His chest pain resolved and he was discharged home.  He presented back to [facility], ER around 4 AM with recurrent severe chest pain.  EKG with inferior ST elevation consistent with acute MI vomiting code STEMI.  Patient was transported emergently to [facility] Cath Lab for revascularization.  LHC found 9 9% distal RCA stenosis successfully treated with PCI and DES 2.5 x 16 mm.  Proximal LAD with 80% stenosis and 40% ostial-proximal LCx stenosis.  He was brought back to the lab for staged PCI to LAD successfully treated with DES 3.0 x 28 mm.  He was started on aspirin and Brilinta, but this was transitioned to [employer] for insurance reasons.  Plan aspirin and Effient x 12 months in the setting of STEMI.  He was also discharged on 12.5 mg losartan and 12.5 mg Toprol.  Continue 80 mg Lipitor.  Echocardiogram with preserved LVEF 55-60%, mild LVH, no significant valvular disease.  He presents for routine cardiology follow up.      CAD - Recent STEMI 05/2023 treated with DES-RCA, staged DES-LAD - Aspirin and Effient x 12 months - Continue losartan, Toprol, 80 mg Lipitor   Hyperlipidemia with LDL goal < 70 05/27/2023: Cholesterol 351; HDL 58; LDL Cholesterol 231; Triglycerides 312; VLDL 62 LP (a) 9 Started on 80 mg lipitor. Will need PCSK9i Can start on zetia   Prediabetes A1c 5.8% Dietary adjustments recommended     Past  Medical History:  Diagnosis Date   Seasonal allergies     Past Surgical History:  Procedure Laterality Date   COLONOSCOPY N/A 03/14/2015   Procedure: COLONOSCOPY;  Surgeon: West Bali, MD;  Location: [facility] ENDO SUITE;  Service: Endoscopy;  Laterality: N/A;  8:30 AM   CORONARY STENT INTERVENTION N/A 05/30/2023   Procedure: CORONARY STENT INTERVENTION;  Surgeon: Yates Decamp, MD;  Location: [facility] INVASIVE CV LAB;  Service: Cardiovascular;  Laterality: N/A;   CORONARY/GRAFT ACUTE MI REVASCULARIZATION N/A 05/27/2023   Procedure: Coronary/Graft Acute MI Revascularization;  Surgeon: Kathleene Hazel, MD;  Location: [facility] INVASIVE CV LAB;  Service: Cardiovascular;  Laterality: N/A;   HERNIA REPAIR  08/24/2011   KNEE ARTHROSCOPY Right 08/23/2000   LEFT HEART CATH AND CORONARY ANGIOGRAPHY N/A 05/27/2023   Procedure: LEFT HEART CATH AND CORONARY ANGIOGRAPHY;  Surgeon: Kathleene Hazel, MD;  Location: [facility] INVASIVE CV LAB;  Service: Cardiovascular;  Laterality: N/A;    Current Medications: No outpatient medications have been marked as taking for the 06/13/23 encounter (Appointment) with Marcelino Duster, PA.     Allergies:   Tequin [gatifloxacin]   Social History   Socioeconomic History   Marital status: Married    Spouse name: Clydie Braun   Number of children: 2   Years of education: College   Highest education level: Not on file  Occupational History   Occupation: Programme researcher, broadcasting/film/video  Tobacco  Use   Smoking status: Never   Smokeless tobacco: Never  Vaping Use   Vaping status: Never Used  Substance and Sexual Activity   Alcohol use: No    Alcohol/week: 0.0 standard drinks of alcohol    Comment: Quit in 1981   Drug use: No   Sexual activity: Yes  Other Topics Concern   Not on file  Social History Narrative   Lives at home with wife and son.   Caffeine use: 1 cups coffee per day   Drinks tea occass   Social Determinants of Health   Financial Resource Strain: Not on file  Food  Insecurity: Low Risk  (04/20/2023)   Received from [facility]   Hunger Vital Sign    Worried About Running Out of Food in the Last Year: Never true    Ran Out of Food in the Last Year: Never true  Transportation Needs: No Transportation Needs (04/20/2023)   Received from [facility]    In the past 12 months, has lack of reliable transportation kept you from medical appointments, meetings, work or from getting things needed for daily living? : No  Physical Activity: Not on file  Stress: Not on file  Social Connections: Not on file     Family History: The patient's ***family history includes CAD in his brother. There is no history of Parkinsonism.  ROS:   Please see the history of present illness.    *** All other systems reviewed and are negative.  EKGs/Labs/Other Studies Reviewed:    The following studies were reviewed today:  Coronary stent intervention 05/30/23:   1st Mrg lesion is 50% stenosed.   Ost Cx to Prox Cx lesion is 40% stenosed.   Prox RCA to Mid RCA lesion is 20% stenosed.   Prox LAD to Mid LAD lesion is 80% stenosed.   Non-stenotic Dist RCA lesion was previously treated.   A drug-eluting stent was successfully placed using a SYNERGY XD 3.0X28.   Post intervention, there is a 0% residual stenosis.   Recommend uninterrupted dual antiplatelet therapy with Aspirin 81mg  daily and Ticagrelor 90mg  twice daily for a minimum of 12 months (ACS-Class I recommendation).   Coronary angioplasty 05/30/2023: Patient with acute inferior MI, 2.5 x 16 mm stent placed in the distal RCA on 05/27/2023.  Stage intervention to LAD. Successful PTCA and stenting of the proximal LAD implantation of 3.0 x 28 mm Synergy XD DES, stenosis reduced from 80% to 0% with TIMI-3 to TIMI-3 flow.    Recommendation: DAPT for 1 year, continued aggressive risk modification.  Patient can potentially be discharged home today if he remains stable.  Outpatient cardiac rehab. _____________    [facility] 05/27/23   Prox RCA to Mid RCA lesion is 20% stenosed.   Dist RCA lesion is 99% stenosed.   Ost Cx to Prox Cx lesion is 40% stenosed.   1st Mrg lesion is 50% stenosed.   Prox LAD lesion is 80% stenosed.   A drug-eluting stent was successfully placed using a SYNERGY XD 2.50X16.   Post intervention, there is a 0% residual stenosis.   Acute inferior STEMI secondary to severe distal RCA stenosis Successful PTCA/DES x 1 distal RCA Severe proximal LAD stenosis Moderate stenosis in the proximal Circumflex and obtuse marginal branch LVEDP=25 mmHg   Recommendations: Will admit to the ICU. Aggrastat infusion for 2 hours. Lasix 40 mg IV x 1 in ICU. DAPT with ASA/Brilinta for one year. High intensity statin. Echo later today. Will  need staged PCI of the proximal LAD next week.  _____________   Echo 05/27/23: 1. Left ventricular ejection fraction, by estimation, is 55 to 60%. The  left ventricle has normal function. The left ventricle has no regional  wall motion abnormalities. There is mild concentric left ventricular  hypertrophy. Left ventricular diastolic  parameters are indeterminate.   2. Right ventricular systolic function is normal. The right ventricular  size is normal. Tricuspid regurgitation signal is inadequate for assessing  PA pressure.   3. The mitral valve is normal in structure. No evidence of mitral valve  regurgitation. No evidence of mitral stenosis.   4. The aortic valve is normal in structure. Aortic valve regurgitation is  not visualized. No aortic stenosis is present.   5. The inferior vena cava is normal in size with greater than 50%  respiratory variability, suggesting right atrial pressure of 3 mmHg.         Recent Labs: 05/27/2023: ALT 41 05/28/2023: Magnesium 2.1 05/31/2023: BUN 13; Creatinine, Ser 0.88; Hemoglobin 10.7; Platelets 233; Potassium 4.2; Sodium 137  Recent Lipid Panel    Component Value Date/Time   CHOL 351 (H) 05/27/2023 0428   TRIG 312 (H)  05/27/2023 0428   HDL 58 05/27/2023 0428   CHOLHDL 6.1 05/27/2023 0428   VLDL 62 (H) 05/27/2023 0428   LDLCALC 231 (H) 05/27/2023 0428     Risk Assessment/Calculations:   {Does this patient have ATRIAL FIBRILLATION?[phone]}  No BP recorded.  {Refresh Note OR Click here to enter BP  :1}***         Physical Exam:    VS:  There were no vitals taken for this visit.    Wt Readings from Last 3 Encounters:  05/30/23 145 lb 1 oz (65.8 kg)  05/26/23 145 lb (65.8 kg)  10/31/22 145 lb (65.8 kg)     GEN: *** Well nourished, well developed in no acute distress HEENT: Normal NECK: No JVD; No carotid bruits LYMPHATICS: No lymphadenopathy CARDIAC: ***RRR, no murmurs, rubs, gallops RESPIRATORY:  Clear to auscultation without rales, wheezing or rhonchi  ABDOMEN: Soft, non-tender, non-distended MUSCULOSKELETAL:  No edema; No deformity  SKIN: Warm and dry NEUROLOGIC:  Alert and oriented x 3 PSYCHIATRIC:  Normal affect   ASSESSMENT:    No diagnosis found. PLAN:    In order of problems listed above:  ***  {The patient has an active order for outpatient cardiac rehabilitation.   Please indicate if the patient is ready to start. Do NOT delete this.  It will auto delete.  Refresh note, then sign.              Click here to document readiness and see contraindications.  :1}  Cardiac Rehabilitation Eligibility Assessment       {Are you ordering a CV Procedure (e.g. stress test, cath, DCCV, TEE, etc)?   Press F2        :[id number]}    Medication Adjustments/Labs and Tests Ordered: Current medicines are reviewed at length with the patient today.  Concerns regarding medicines are outlined above.  No orders of the defined types were placed in this encounter.  No orders of the defined types were placed in this encounter.   There are no Patient Instructions on file for this visit.   Signed, Marcelino Duster, PA  06/06/2023 7:45 PM    [facility]

## 2023-06-08 ENCOUNTER — Encounter (HOSPITAL_COMMUNITY)
Admission: RE | Admit: 2023-06-08 | Discharge: 2023-06-08 | Disposition: A | Discharge status: Home / Self Care | Payer: Medicare Other | Source: Ambulatory Visit | Attending: Cardiology | Admitting: Cardiology

## 2023-06-08 ENCOUNTER — Encounter (HOSPITAL_COMMUNITY): Payer: Self-pay

## 2023-06-08 DIAGNOSIS — Z955 Presence of coronary angioplasty implant and graft: Secondary | ICD-10-CM | POA: Insufficient documentation

## 2023-06-08 DIAGNOSIS — I213 ST elevation (STEMI) myocardial infarction of unspecified site: Secondary | ICD-10-CM | POA: Insufficient documentation

## 2023-06-09 ENCOUNTER — Encounter (HOSPITAL_COMMUNITY)
Admission: RE | Admit: 2023-06-09 | Discharge: 2023-06-09 | Disposition: A | Discharge status: Home / Self Care | Payer: Medicare Other | Source: Ambulatory Visit | Attending: Cardiology | Admitting: Cardiology

## 2023-06-09 VITALS — Ht 66.5 in | Wt 146.4 lb

## 2023-06-09 DIAGNOSIS — I213 ST elevation (STEMI) myocardial infarction of unspecified site: Secondary | ICD-10-CM | POA: Diagnosis present

## 2023-06-09 DIAGNOSIS — Z955 Presence of coronary angioplasty implant and graft: Secondary | ICD-10-CM

## 2023-06-09 NOTE — Patient Instructions (Signed)
Patient Instructions  Patient Details  Name: Juan Schultz MRN: [id number] Date of Birth: 1956-05-17 Referring Provider:  Yates Decamp, MD  Below are your personal goals for exercise, nutrition, and risk factors. Our goal is to help you stay on track towards obtaining and maintaining these goals. We will be discussing your progress on these goals with you throughout the program.  Initial Exercise Prescription:  Initial Exercise Prescription - 06/09/23 1400       Date of Initial Exercise RX and Referring Provider   Date 06/09/23    Referring Provider Yates Decamp MD   Primary Cardiologist: Dr. Verne Carrow     Oxygen   Maintain Oxygen Saturation 88% or higher      Treadmill   MPH 2.7    Grade 0.5    Minutes 15    METs 3.25      REL-XR   Level 3    Speed 50    Minutes 15    METs 3      Prescription Details   Frequency (times per week) 2    Duration Progress to 30 minutes of continuous aerobic without signs/symptoms of physical distress      Intensity   THRR 40-80% of Max Heartrate 117-142    Ratings of Perceived Exertion 11-13    Perceived Dyspnea 0-4      Progression   Progression Continue to progress workloads to maintain intensity without signs/symptoms of physical distress.      Resistance Training   Training Prescription Yes    Weight 3 lb    Reps 10-15             Exercise Goals: Frequency: Be able to perform aerobic exercise two to three times per week in program working toward 2-5 days per week of home exercise.  Intensity: Work with a perceived exertion of 11 (fairly light) - 15 (hard) while following your exercise prescription.  We will make changes to your prescription with you as you progress through the program.   Duration: Be able to do 30 to 45 minutes of continuous aerobic exercise in addition to a 5 minute warm-up and a 5 minute cool-down routine.   Nutrition Goals: Your personal nutrition goals will be established when you do your  nutrition analysis with the dietician.  The following are general nutrition guidelines to follow: Cholesterol < 200mg /day Sodium < 1500mg /day Fiber: Men over 50 yrs - 30 grams per day  Personal Goals:  Personal Goals and Risk Factors at Admission - 06/09/23 1443       Core Components/Risk Factors/Patient Goals on Admission    Weight Management Weight Maintenance;Yes    Intervention Weight Management: Develop a combined nutrition and exercise program designed to reach desired caloric intake, while maintaining appropriate intake of nutrient and fiber, sodium and fats, and appropriate energy expenditure required for the weight goal.;Weight Management: Provide education and appropriate resources to help participant work on and attain dietary goals.    Admit Weight 146 lb 6.4 oz (66.4 kg)    Goal Weight: Short Term 146 lb (66.2 kg)    Goal Weight: Long Term 146 lb (66.2 kg)    Expected Outcomes Short Term: Continue to assess and modify interventions until short term weight is achieved;Long Term: Adherence to nutrition and physical activity/exercise program aimed toward attainment of established weight goal;Weight Maintenance: Understanding of the daily nutrition guidelines, which includes 25-35% calories from fat, 7% or less cal from saturated fats, less than 200mg  cholesterol, less than 1.5gm  of sodium, & 5 or more servings of fruits and vegetables daily    Improve shortness of breath with ADL's Yes    Intervention Provide education, individualized exercise plan and daily activity instruction to help decrease symptoms of SOB with activities of daily living.    Expected Outcomes Short Term: Improve cardiorespiratory fitness to achieve a reduction of symptoms when performing ADLs;Long Term: Be able to perform more ADLs without symptoms or delay the onset of symptoms    Lipids Yes    Intervention Provide education and support for participant on nutrition & aerobic/resistive exercise along with  prescribed medications to achieve LDL 70mg , HDL >40mg .    Expected Outcomes Short Term: Participant states understanding of desired cholesterol values and is compliant with medications prescribed. Participant is following exercise prescription and nutrition guidelines.;Long Term: Cholesterol controlled with medications as prescribed, with individualized exercise RX and with personalized nutrition plan. Value goals: LDL < 70mg , HDL > 40 mg.             Tobacco Use Initial Evaluation: Social History   Tobacco Use  Smoking Status Never  Smokeless Tobacco Never    Exercise Goals and Review:  Exercise Goals     Row Name 06/09/23 1441             Exercise Goals   Increase Physical Activity Yes       Intervention Provide advice, education, support and counseling about physical activity/exercise needs.;Develop an individualized exercise prescription for aerobic and resistive training based on initial evaluation findings, risk stratification, comorbidities and participant's personal goals.       Expected Outcomes Short Term: Attend rehab on a regular basis to increase amount of physical activity.;Long Term: Add in home exercise to make exercise part of routine and to increase amount of physical activity.;Long Term: Exercising regularly at least 3-5 days a week.       Increase Strength and Stamina Yes       Intervention Provide advice, education, support and counseling about physical activity/exercise needs.;Develop an individualized exercise prescription for aerobic and resistive training based on initial evaluation findings, risk stratification, comorbidities and participant's personal goals.       Expected Outcomes Short Term: Increase workloads from initial exercise prescription for resistance, speed, and METs.;Short Term: Perform resistance training exercises routinely during rehab and add in resistance training at home;Long Term: Improve cardiorespiratory fitness, muscular endurance and  strength as measured by increased METs and functional capacity ( )       Able to understand and use rate of perceived exertion (RPE) scale Yes       Intervention Provide education and explanation on how to use RPE scale       Expected Outcomes Short Term: Able to use RPE daily in rehab to express subjective intensity level;Long Term:  Able to use RPE to guide intensity level when exercising independently       Able to understand and use Dyspnea scale Yes       Intervention Provide education and explanation on how to use Dyspnea scale       Expected Outcomes Short Term: Able to use Dyspnea scale daily in rehab to express subjective sense of shortness of breath during exertion;Long Term: Able to use Dyspnea scale to guide intensity level when exercising independently       Knowledge and understanding of Target Heart Rate Range (THRR) Yes       Intervention Provide education and explanation of THRR including how the numbers were predicted and  where they are located for reference       Expected Outcomes Short Term: Able to state/look up THRR;Long Term: Able to use THRR to govern intensity when exercising independently;Short Term: Able to use daily as guideline for intensity in rehab       Able to check pulse independently Yes       Intervention Provide education and demonstration on how to check pulse in carotid and radial arteries.;Review the importance of being able to check your own pulse for safety during independent exercise       Expected Outcomes Short Term: Able to explain why pulse checking is important during independent exercise;Long Term: Able to check pulse independently and accurately       Understanding of Exercise Prescription Yes       Intervention Provide education, explanation, and written materials on patient's individual exercise prescription       Expected Outcomes Short Term: Able to explain program exercise prescription;Long Term: Able to explain home exercise prescription to  exercise independently              Copy of goals given to participant.

## 2023-06-09 NOTE — Progress Notes (Signed)
Cardiac Individual Treatment Plan  Patient Details  Name: Juan Schultz MRN: [id number] Date of Birth: 1956/07/07 Referring Provider:   Flowsheet Row CARDIAC REHAB PHASE II ORIENTATION from 06/09/2023 in [facility] CARDIAC REHABILITATION  Referring Provider Yates Decamp MD  [Primary Cardiologist: Dr. Cristal Deer McAlhany]       Initial Encounter Date:  Flowsheet Row CARDIAC REHAB PHASE II ORIENTATION from 06/09/2023 in [facility] [place] CARDIAC REHABILITATION  Date 06/09/23       Visit Diagnosis: ST elevation myocardial infarction (STEMI), unspecified artery [facility])  Status post coronary artery stent placement  Patient's Home Medications on Admission:  Current Outpatient Medications:    albuterol (VENTOLIN HFA) 108 (90 Base) MCG/ACT inhaler, Inhale 2 puffs into the lungs as needed for wheezing or shortness of breath., Disp: , Rfl:    aspirin EC 81 MG tablet, Take 1 tablet (81 mg total) by mouth daily. Swallow whole., Disp: 90 tablet, Rfl: 3   atorvastatin (LIPITOR) 80 MG tablet, Take 1 tablet (80 mg total) by mouth daily. (Patient not taking: Reported on 06/08/2023), Disp: 90 tablet, Rfl: 3   cetirizine (ZYRTEC) 10 MG tablet, Take 10 mg by mouth daily as needed for allergies., Disp: , Rfl:    Cholecalciferol (D3 PO), Take 1 tablet by mouth daily., Disp: , Rfl:    losartan (COZAAR) 25 MG tablet, Take 0.5 tablets (12.5 mg total) by mouth daily. (Patient not taking: Reported on 06/08/2023), Disp: 90 tablet, Rfl: 3   metoprolol succinate (TOPROL-XL) 25 MG 24 hr tablet, Take 0.5 tablets (12.5 mg total) by mouth daily., Disp: 90 tablet, Rfl: 3   nitroGLYCERIN (NITROSTAT) 0.4 MG SL tablet, Place 1 tablet (0.4 mg total) under the tongue every 5 (five) minutes as needed for chest pain., Disp: 25 tablet, Rfl: 3   Omega-3 Fatty Acids (OMEGA-3 FISH OIL) 500 MG CAPS, Take 500 mg by mouth daily. Take 2 daily 1000 mg/day, Disp: , Rfl:    prasugrel (EFFIENT) 10 MG TABS tablet, Take 1 tablet (10 mg total) by  mouth daily., Disp: 90 tablet, Rfl: 3  Past Medical History: Past Medical History:  Diagnosis Date   Seasonal allergies     Tobacco Use: Social History   Tobacco Use  Smoking Status Never  Smokeless Tobacco Never    Labs: Review Flowsheet       Latest Ref Rng & Units 05/27/2023  Labs for ITP Cardiac and Pulmonary Rehab  Cholestrol 0 - 200 mg/dL 132   LDL (calc) 0 - 99 mg/dL 440   HDL-C >10 mg/dL 58   Trlycerides <272 mg/dL 536   Hemoglobin U4Q 4.8 - 5.6 % 5.8     Details            Capillary Blood Glucose: Lab Results  Component Value Date   GLUCAP 150 (H) 05/27/2023     Exercise Target Goals: Exercise Program Goal: Individual exercise prescription set using results from initial 6 min walk test and THRR while considering  patient's activity barriers and safety.   Exercise Prescription Goal: Starting with aerobic activity 30 plus minutes a day, 3 days per week for initial exercise prescription. Provide home exercise prescription and guidelines that participant acknowledges understanding prior to discharge.  Activity Barriers & Risk Stratification:  Activity Barriers & Cardiac Risk Stratification - 06/09/23 1436       Activity Barriers & Cardiac Risk Stratification   Activity Barriers Arthritis;Shortness of Breath;Chest Pain/Angina;Joint Problems   R knee is bone on bone   Cardiac Risk Stratification Moderate  6 Minute Walk:  6 Minute Walk     Row Name 06/09/23 1434         6 Minute Walk   Phase Initial     Distance 1440 feet     Walk Time 6 minutes     # of Rest Breaks 0     MPH 2.72     METS 3.75     RPE 11     Perceived Dyspnea  1     VO2 Peak 13.13     Symptoms Yes (comment)     Comments chest "cloudy" 4/10, SOB     Resting HR 91 bpm     Resting BP 102/64     Resting Oxygen Saturation  96 %     Exercise Oxygen Saturation  during 6 min walk 99 %     Max Ex. HR 108 bpm     Max Ex. BP 134/62     2 Minute Post BP 120/66               Oxygen Initial Assessment:   Oxygen Re-Evaluation:   Oxygen Discharge (Final Oxygen Re-Evaluation):   Initial Exercise Prescription:  Initial Exercise Prescription - 06/09/23 1400       Date of Initial Exercise RX and Referring Provider   Date 06/09/23    Referring Provider Yates Decamp MD   Primary Cardiologist: Dr. Verne Carrow     Oxygen   Maintain Oxygen Saturation 88% or higher      Treadmill   MPH 2.7    Grade 0.5    Minutes 15    METs 3.25      REL-XR   Level 3    Speed 50    Minutes 15    METs 3      Prescription Details   Frequency (times per week) 2    Duration Progress to 30 minutes of continuous aerobic without signs/symptoms of physical distress      Intensity   THRR 40-80% of Max Heartrate 117-142    Ratings of Perceived Exertion 11-13    Perceived Dyspnea 0-4      Progression   Progression Continue to progress workloads to maintain intensity without signs/symptoms of physical distress.      Resistance Training   Training Prescription Yes    Weight 3 lb    Reps 10-15             Perform Capillary Blood Glucose checks as needed.  Exercise Prescription Changes:   Exercise Prescription Changes     Row Name 06/09/23 1400             Response to Exercise   Blood Pressure (Admit) 102/64       Blood Pressure (Exercise) 134/62       Blood Pressure (Exit) 120/66       Heart Rate (Admit) 91 bpm       Heart Rate (Exercise) 108 bpm       Heart Rate (Exit) 90 bpm       Oxygen Saturation (Admit) 96 %       Oxygen Saturation (Exercise) 99 %       Rating of Perceived Exertion (Exercise) 11       Perceived Dyspnea (Exercise) 1       Symptoms chest "cloudy", SOB       Comments walk test restuls                Exercise Comments:   Exercise  Goals and Review:   Exercise Goals     Row Name 06/09/23 1441             Exercise Goals   Increase Physical Activity Yes       Intervention Provide advice,  education, support and counseling about physical activity/exercise needs.;Develop an individualized exercise prescription for aerobic and resistive training based on initial evaluation findings, risk stratification, comorbidities and participant's personal goals.       Expected Outcomes Short Term: Attend rehab on a regular basis to increase amount of physical activity.;Long Term: Add in home exercise to make exercise part of routine and to increase amount of physical activity.;Long Term: Exercising regularly at least 3-5 days a week.       Increase Strength and Stamina Yes       Intervention Provide advice, education, support and counseling about physical activity/exercise needs.;Develop an individualized exercise prescription for aerobic and resistive training based on initial evaluation findings, risk stratification, comorbidities and participant's personal goals.       Expected Outcomes Short Term: Increase workloads from initial exercise prescription for resistance, speed, and METs.;Short Term: Perform resistance training exercises routinely during rehab and add in resistance training at home;Long Term: Improve cardiorespiratory fitness, muscular endurance and strength as measured by increased METs and functional capacity ( )       Able to understand and use rate of perceived exertion (RPE) scale Yes       Intervention Provide education and explanation on how to use RPE scale       Expected Outcomes Short Term: Able to use RPE daily in rehab to express subjective intensity level;Long Term:  Able to use RPE to guide intensity level when exercising independently       Able to understand and use Dyspnea scale Yes       Intervention Provide education and explanation on how to use Dyspnea scale       Expected Outcomes Short Term: Able to use Dyspnea scale daily in rehab to express subjective sense of shortness of breath during exertion;Long Term: Able to use Dyspnea scale to guide intensity level when  exercising independently       Knowledge and understanding of Target Heart Rate Range (THRR) Yes       Intervention Provide education and explanation of THRR including how the numbers were predicted and where they are located for reference       Expected Outcomes Short Term: Able to state/look up THRR;Long Term: Able to use THRR to govern intensity when exercising independently;Short Term: Able to use daily as guideline for intensity in rehab       Able to check pulse independently Yes       Intervention Provide education and demonstration on how to check pulse in carotid and radial arteries.;Review the importance of being able to check your own pulse for safety during independent exercise       Expected Outcomes Short Term: Able to explain why pulse checking is important during independent exercise;Long Term: Able to check pulse independently and accurately       Understanding of Exercise Prescription Yes       Intervention Provide education, explanation, and written materials on patient's individual exercise prescription       Expected Outcomes Short Term: Able to explain program exercise prescription;Long Term: Able to explain home exercise prescription to exercise independently                Exercise Goals Re-Evaluation :  Discharge Exercise Prescription (Final Exercise Prescription Changes):  Exercise Prescription Changes - 06/09/23 1400       Response to Exercise   Blood Pressure (Admit) 102/64    Blood Pressure (Exercise) 134/62    Blood Pressure (Exit) 120/66    Heart Rate (Admit) 91 bpm    Heart Rate (Exercise) 108 bpm    Heart Rate (Exit) 90 bpm    Oxygen Saturation (Admit) 96 %    Oxygen Saturation (Exercise) 99 %    Rating of Perceived Exertion (Exercise) 11    Perceived Dyspnea (Exercise) 1    Symptoms chest "cloudy", SOB    Comments walk test restuls             Nutrition:  Target Goals: Understanding of nutrition guidelines, daily intake of sodium  1500mg , cholesterol 200mg , calories 30% from fat and 7% or less from saturated fats, daily to have 5 or more servings of fruits and vegetables.  Biometrics:  Pre Biometrics - 06/09/23 1441       Pre Biometrics   Height 5' 6.5" (1.689 m)    Weight 66.4 kg    Waist Circumference 32 inches    Hip Circumference 33.5 inches    Waist to Hip Ratio 0.96 %    BMI (Calculated) 23.28    Grip Strength 34.5 kg    Single Leg Stand 16.5 seconds              Nutrition Therapy Plan and Nutrition Goals:   Nutrition Assessments:  MEDIFICTS Score Key: >=70 Need to make dietary changes  40-70 Heart Healthy Diet <= 40 Therapeutic Level Cholesterol Diet  Flowsheet Row CARDIAC VIRTUAL BASED CARE from 06/08/2023 in [facility] CARDIAC REHABILITATION  Picture Your Plate Total Score on Admission 39      Picture Your Plate Scores: <78 Unhealthy dietary pattern with much room for improvement. 41-50 Dietary pattern unlikely to meet recommendations for good health and room for improvement. 51-60 More healthful dietary pattern, with some room for improvement.  >60 Healthy dietary pattern, although there may be some specific behaviors that could be improved.    Nutrition Goals Re-Evaluation:   Nutrition Goals Discharge (Final Nutrition Goals Re-Evaluation):   Psychosocial: Target Goals: Acknowledge presence or absence of significant depression and/or stress, maximize coping skills, provide positive support system. Participant is able to verbalize types and ability to use techniques and skills needed for reducing stress and depression.  Initial Review & Psychosocial Screening:  Initial Psych Review & Screening - 06/08/23 1343       Initial Review   Current issues with None Identified      Family Dynamics   Good Support System? Yes      Barriers   Psychosocial barriers to participate in program There are no identifiable barriers or psychosocial needs.;The patient should benefit from  training in stress management and relaxation.      Screening Interventions   Interventions To provide support and resources with identified psychosocial needs;Encouraged to exercise;Provide feedback about the scores to participant    Expected Outcomes Short Term goal: Utilizing psychosocial counselor, staff and physician to assist with identification of specific Stressors or current issues interfering with healing process. Setting desired goal for each stressor or current issue identified.;Long Term Goal: Stressors or current issues are controlled or eliminated.;Short Term goal: Identification and review with participant of any Quality of Life or Depression concerns found by scoring the questionnaire.;Long Term goal: The participant improves quality of Life and PHQ9 Scores as seen  by post scores and/or verbalization of changes             Quality of Life Scores:  Quality of Life - 06/08/23 1342       Quality of Life   Select Quality of Life      Quality of Life Scores   Health/Function Pre 23.67 %    Socioeconomic Pre 22.81 %    Psych/Spiritual Pre 26.93 %    Family Pre 30 %    GLOBAL Pre 25.03 %            Scores of 19 and below usually indicate a poorer quality of life in these areas.  A difference of  2-3 points is a clinically meaningful difference.  A difference of 2-3 points in the total score of the Quality of Life Index has been associated with significant improvement in overall quality of life, self-image, physical symptoms, and general health in studies assessing change in quality of life.  PHQ-9: Review Flowsheet        No data to display         Interpretation of Total Score  Total Score Depression Severity:  1-4 = Minimal depression, 5-9 = Mild depression, 10-14 = Moderate depression, 15-19 = Moderately severe depression, 20-27 = Severe depression   Psychosocial Evaluation and Intervention:  Psychosocial Evaluation - 06/08/23 1344       Psychosocial  Evaluation & Interventions   Interventions Stress management education;Relaxation education;Encouraged to exercise with the program and follow exercise prescription    Comments Patient was referred to CR with STEMI/Stent placement 05/27/23. He has been having some issues with hypotension since his discharge with some light-headedness and general malaise. His losartan was discontinued with improved symptoms and blood pressure. 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 He has not returned to work but plans to return once released from his cardiologist. He lives with his wife who is his main support person. His son and daughter-in-law also support him. He denies any depression, anxiety or major stressors in his life. He says he sleeps well most of the time. He usually gets 6 hours/night which he says is good for him. He is motivated to start the program to improve his health overall. He also wants to improve his stamina; feel better overall and get back to normal. He also enjoys hiking and would like to be able to do this again. He has no barriers identified to participate in the program.    Expected Outcomes Short Term: Start the program and attend consistently. Long Term: Meet his personal goals.    Continue Psychosocial Services  Follow up required by staff             Psychosocial Re-Evaluation:   Psychosocial Discharge (Final Psychosocial Re-Evaluation):   Vocational Rehabilitation: Provide vocational rehab assistance to qualifying candidates.   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             Education: Education Goals: Education classes will be provided  on a weekly basis, covering required topics. Participant will state understanding/return demonstration of topics presented.  Learning Barriers/Preferences:  Learning Barriers/Preferences - 06/08/23 1339       Learning Barriers/Preferences  Learning Barriers None    Learning Preferences Audio;Skilled Demonstration             Education Topics: Hypertension, Hypertension Reduction -Define heart disease and high blood pressure. Discus how high blood pressure affects the body and ways to reduce high blood pressure.   Exercise and Your Heart -Discuss why it is important to exercise, the FITT principles of exercise, normal and abnormal responses to exercise, and how to exercise safely.   Angina -Discuss definition of angina, causes of angina, treatment of angina, and how to decrease risk of having angina.   Cardiac Medications -Review what the following cardiac medications are used for, how they affect the body, and side effects that may occur when taking the medications.  Medications include Aspirin, Beta blockers, calcium channel blockers, ACE Inhibitors, angiotensin receptor blockers, diuretics, digoxin, and antihyperlipidemics.   Congestive Heart Failure -Discuss the definition of CHF, how to live with CHF, the signs and symptoms of CHF, and how keep track of weight and sodium intake.   Heart Disease and Intimacy -Discus the effect sexual activity has on the heart, how changes occur during intimacy as we age, and safety during sexual activity.   Smoking Cessation / COPD -Discuss different methods to quit smoking, the health benefits of quitting smoking, and the definition of COPD.   Nutrition I: Fats -Discuss the types of cholesterol, what cholesterol does to the heart, and how cholesterol levels can be controlled.   Nutrition II: Labels -Discuss the different components of food labels and how to read food label   Heart Parts/Heart Disease and PAD -Discuss the  anatomy of the heart, the pathway of blood circulation through the heart, and these are affected by heart disease.   Stress I: Signs and Symptoms -Discuss the causes of stress, how stress may lead to anxiety and depression, and ways to limit stress.   Stress II: Relaxation -Discuss different types of relaxation techniques to limit stress.   Warning Signs of Stroke / TIA -Discuss definition of a stroke, what the signs and symptoms are of a stroke, and how to identify when someone is having stroke.   Knowledge Questionnaire Score:  Knowledge Questionnaire Score - 06/08/23 1342       Knowledge Questionnaire Score   Pre Score 22/26             Core Components/Risk Factors/Patient Goals at Admission:  Personal Goals and Risk Factors at Admission - 06/09/23 1443       Core Components/Risk Factors/Patient Goals on Admission    Weight Management Weight Maintenance;Yes    Intervention Weight Management: Develop a combined nutrition and exercise program designed to reach desired caloric intake, while maintaining appropriate intake of nutrient and fiber, sodium and fats, and appropriate energy expenditure required for the weight goal.;Weight Management: Provide education and appropriate resources to help participant work on and attain dietary goals.    Admit Weight 146 lb 6.4 oz (66.4 kg)    Goal Weight: Short Term 146 lb (66.2 kg)    Goal Weight: Long Term 146 lb (66.2 kg)    Expected Outcomes Short Term: Continue to assess and modify interventions until short term weight is achieved;Long Term: Adherence to nutrition and physical activity/exercise program aimed toward attainment of established weight goal;Weight Maintenance: Understanding of the daily nutrition guidelines, which includes 25-35% calories from fat, 7% or less cal from saturated fats, less than 200mg  cholesterol, less than 1.5gm of sodium, & 5 or more servings of fruits and vegetables daily  Improve shortness of breath  with ADL's Yes    Intervention Provide education, individualized exercise plan and daily activity instruction to help decrease symptoms of SOB with activities of daily living.    Expected Outcomes Short Term: Improve cardiorespiratory fitness to achieve a reduction of symptoms when performing ADLs;Long Term: Be able to perform more ADLs without symptoms or delay the onset of symptoms    Lipids Yes    Intervention Provide education and support for participant on nutrition & aerobic/resistive exercise along with prescribed medications to achieve LDL 70mg , HDL >40mg .    Expected Outcomes Short Term: Participant states understanding of desired cholesterol values and is compliant with medications prescribed. Participant is following exercise prescription and nutrition guidelines.;Long Term: Cholesterol controlled with medications as prescribed, with individualized exercise RX and with personalized nutrition plan. Value goals: LDL < 70mg , HDL > 40 mg.             Core Components/Risk Factors/Patient Goals Review:    Core Components/Risk Factors/Patient Goals at Discharge (Final Review):    ITP Comments:  ITP Comments     Row Name 06/09/23 1419           ITP Comments Patient attend orientation today.  Patient is attendingCardiac Rehabilitation Program.  Documentation for diagnosis can be found in [facility] 05/27/[facility].  Reviewed medical chart, RPE/RPD, gym safety, and program guidelines.  Patient was fitted to equipment they will be using during rehab.  Patient is scheduled to start exercise on Tuesday 06/14/23 at 1500.   Initial ITP created and sent for review and signature by Dr. Dina Rich, Medical Director for Cardiac Rehabilitation Program.                Comments: Initial ITP

## 2023-06-12 NOTE — Progress Notes (Unsigned)
Cardiology Office Note    Date:  06/13/2023  ID:  Juan Schultz, DOB [birth year], MRN [id number] PCP:  Assunta Found, MD  Cardiologist:  Verne Carrow, MD  Electrophysiologist:  None   Chief Complaint: Hospital follow up regarding CAD   History of Present Illness: .    Juan Schultz is a 67 y.o. male with visit-pertinent history of CAD with recent STEMI on 05/25/2023, hyperlipidemia and hypertension.  On 05/27/23 he presented to [facility], ER with chest pain concerning for angina.  Troponin was negative x 2 and EKG was nonischemic, his chest pain resolved and he was discharged home.  He presented back to [facility], ER around 4 AM with recurrent severe chest pain.  EKG with inferior ST elevation consistent with acute, code STEMI was activated.  Patient was transported emergently to [facility] Cath Lab for revascularization.  LHC found 99% distal RCA stenosis which was successfully treated with PCI and DES 2.5 x 16 mm.  Proximal LAD with 80% stenosis and 40% ostial to proximal LCx stenosis, he was brought back to the Cath Lab on 10/7 for staged PCI to LAD successfully treated with DES 3.0 x 28 mm.  He was started on aspirin and Brilinta, however he was transition to [employer] for insurance reasons.  Plan is for DAPT with aspirin and Effient for 12 months in setting of STEMI.  He was also discharged on 12.5 mg Toprol and 12.5 mg losartan.  Echocardiogram indicated LVEF 55 to 60%, mild LVH and no significant valvular disease.  He presents today for hospital follow-up.  He reports that he has been having an intermittent "band like" tightness around his chest accompanied by increased shortness of breath, rates discomfort as a 4/10. Discomfort and shortness of breath is present with exertion and at rest, at times feels worse when taking a deep breath. He is having about 4 episodes of discomfort a day. States that his discomfort is more mild than pain prompting ED evaluation for STEMI. Of note he reported  pain while laying down on exam table during visit. He has not taken any sublingual nitroglycerin. He self discontinued Losartan 12.5 mg daily after having systolic blood pressures in the 90's. On review of his home blood pressure log he is averaging 100/70, ranging from 94/64 to 113/75.   Labwork independently reviewed: 05/31/2023: Sodium 137, potassium 4.2, creatinine 0.88, hemoglobin 10.7, hematocrit 32.9 ROS: .   Today he denies lower extremity edema, fatigue, palpitations, melena, hematuria, hemoptysis, diaphoresis, weakness, presyncope, syncope, orthopnea, and PND.  All other systems are reviewed and otherwise negative. Studies Reviewed: Marland Kitchen    EKG:  EKG is ordered today, personally reviewed, demonstrating  EKG Interpretation Date/Time:  Monday June 13 2023 13:55:32 EDT Ventricular Rate:  80 PR Interval:  152 QRS Duration:  82 QT Interval:  378 QTC Calculation: 435 R Axis:   -37  Text Interpretation: Normal sinus rhythm Left axis deviation Possible Inferior infarct (cited on or before 31-May-2023) T wave inversion now evident in Inferior leads T wave amplitude has increased in Anterior leads Confirmed by Reather Littler [phone]) on 06/13/2023 3:24:59 PM   CV Studies: Cardiac studies reviewed are outlined and summarized above. Otherwise please see EMR for full report. Cardiac Studies & Procedures   CARDIAC CATHETERIZATION  CARDIAC CATHETERIZATION 05/30/2023  Narrative Images from the original result were not included.    1st Mrg lesion is 50% stenosed.   Ost Cx to Prox Cx lesion is 40% stenosed.   Prox RCA  to Mid RCA lesion is 20% stenosed.   Prox LAD to Mid LAD lesion is 80% stenosed.   Non-stenotic Dist RCA lesion was previously treated.   A drug-eluting stent was successfully placed using a SYNERGY XD 3.0X28.   Post intervention, there is a 0% residual stenosis.   Recommend uninterrupted dual antiplatelet therapy with Aspirin 81mg  daily and Ticagrelor 90mg  twice daily for a  minimum of 12 months (ACS-Class I recommendation).  Coronary angioplasty 05/30/2023: Patient with acute inferior MI, 2.5 x 16 mm stent placed in the distal RCA on 05/27/2023.  Stage intervention to LAD. Successful PTCA and stenting of the proximal LAD implantation of 3.0 x 28 mm Synergy XD DES, stenosis reduced from 80% to 0% with TIMI-3 to TIMI-3 flow.   Recommendation: DAPT for 1 year, continued aggressive risk modification.  Patient can potentially be discharged home today if he remains stable.  Outpatient cardiac rehab.  Findings Coronary Findings Diagnostic  Dominance: Right  Left Anterior Descending Vessel is large. Prox LAD to Mid LAD lesion is 80% stenosed. The lesion is type B2 and irregular.  Left Circumflex Vessel is moderate in size. Ost Cx to Prox Cx lesion is 40% stenosed.  First Obtuse Marginal Branch 1st Mrg lesion is 50% stenosed.  Right Coronary Artery Vessel is large. Prox RCA to Mid RCA lesion is 20% stenosed. Non-stenotic Dist RCA lesion was previously treated.  Intervention  Prox LAD to Mid LAD lesion Stent Lesion length:  22 mm. CATH VISTA GUIDE 6FR XB3.5 guide catheter was inserted. Lesion crossed with guidewire using a WIRE RUNTHROUGH .[id number]. Pre-stent angioplasty was performed using a BALLN EUPHORA RX 2.5X25. Maximum pressure:  14 atm. Inflation time:  60 sec. A drug-eluting stent was successfully placed using a SYNERGY XD 3.0X28. Maximum pressure: 12 atm. Inflation time: 45 sec. Stent strut is well apposed. Post-stent angioplasty was not performed. Post-Intervention Lesion Assessment The intervention was successful. Pre-interventional TIMI flow is 3. Post-intervention TIMI flow is 3. No complications occurred at this lesion. There is a 0% residual stenosis post intervention.   CARDIAC CATHETERIZATION 05/27/2023  Narrative   Prox RCA to Mid RCA lesion is 20% stenosed.   Dist RCA lesion is 99% stenosed.   Ost Cx to Prox Cx lesion is 40%  stenosed.   1st Mrg lesion is 50% stenosed.   Prox LAD lesion is 80% stenosed.   A drug-eluting stent was successfully placed using a SYNERGY XD 2.50X16.   Post intervention, there is a 0% residual stenosis.  Acute inferior STEMI secondary to severe distal RCA stenosis Successful PTCA/DES x 1 distal RCA Severe proximal LAD stenosis Moderate stenosis in the proximal Circumflex and obtuse marginal branch LVEDP=25 mmHg  Recommendations: Will admit to the ICU. Aggrastat infusion for 2 hours. Lasix 40 mg IV x 1 in ICU. DAPT with ASA/Brilinta for one year. High intensity statin. Echo later today. Will need staged PCI of the proximal LAD next week.  Findings Coronary Findings Diagnostic  Dominance: Right  Left Anterior Descending Vessel is large. Prox LAD lesion is 80% stenosed.  Left Circumflex Vessel is moderate in size. Ost Cx to Prox Cx lesion is 40% stenosed.  First Obtuse Marginal Branch 1st Mrg lesion is 50% stenosed.  Right Coronary Artery Vessel is large. Prox RCA to Mid RCA lesion is 20% stenosed. Dist RCA lesion is 99% stenosed.  Intervention  Dist RCA lesion Stent CATH VISTA GUIDE 6FR JR4 guide catheter was inserted. Lesion crossed with guidewire using a WIRE ASAHI PROWATER 180CM.  Pre-stent angioplasty was performed using a BALLN EMERGE MR 2.0X12. A drug-eluting stent was successfully placed using a SYNERGY XD 2.50X16. Stent strut is well apposed. Post-stent angioplasty was performed using a BALLN [place] EMERGE MR [id number]. Post-Intervention Lesion Assessment The intervention was successful. Pre-interventional TIMI flow is 3. Post-intervention TIMI flow is 3. No complications occurred at this lesion. There is a 0% residual stenosis post intervention.     ECHOCARDIOGRAM  ECHOCARDIOGRAM COMPLETE 05/27/2023  Narrative ECHOCARDIOGRAM REPORT    Patient Name:   Juan Schultz Date of Exam: 05/27/2023 Medical Rec #:  [id number]       Height:       66.5 in Accession #:     [id number]      Weight:       145.0 lb Date of Birth:  [birth year]       BSA:          1.754 m Patient Age:    67 years        BP:           100/64 mmHg Patient Gender: M               HR:           79 bpm. Exam Location:  Inpatient  Procedure: 2D Echo, Color Doppler, Cardiac Doppler and Intracardiac Opacification Agent  Indications:    STEMI  History:        Patient has no prior history of Echocardiogram examinations. Acute MI; Risk Factors:Dyslipidemia.  Sonographer:    Milbert Coulter Referring Phys: 64 CHRISTOPHER D MCALHANY  IMPRESSIONS   1. Left ventricular ejection fraction, by estimation, is 55 to 60%. The left ventricle has normal function. The left ventricle has no regional wall motion abnormalities. There is mild concentric left ventricular hypertrophy. Left ventricular diastolic parameters are indeterminate. 2. Right ventricular systolic function is normal. The right ventricular size is normal. Tricuspid regurgitation signal is inadequate for assessing PA pressure. 3. The mitral valve is normal in structure. No evidence of mitral valve regurgitation. No evidence of mitral stenosis. 4. The aortic valve is normal in structure. Aortic valve regurgitation is not visualized. No aortic stenosis is present. 5. The inferior vena cava is normal in size with greater than 50% respiratory variability, suggesting right atrial pressure of 3 mmHg.  FINDINGS Left Ventricle: Left ventricular ejection fraction, by estimation, is 55 to 60%. The left ventricle has normal function. The left ventricle has no regional wall motion abnormalities. Definity contrast agent was given IV to delineate the left ventricular endocardial borders. The left ventricular internal cavity size was normal in size. There is mild concentric left ventricular hypertrophy. Left ventricular diastolic parameters are indeterminate.  Right Ventricle: The right ventricular size is normal. No increase in right ventricular wall  thickness. Right ventricular systolic function is normal. Tricuspid regurgitation signal is inadequate for assessing PA pressure.  Left Atrium: Left atrial size was normal in size.  Right Atrium: Right atrial size was normal in size.  Pericardium: There is no evidence of pericardial effusion.  Mitral Valve: The mitral valve is normal in structure. No evidence of mitral valve regurgitation. No evidence of mitral valve stenosis.  Tricuspid Valve: The tricuspid valve is normal in structure. Tricuspid valve regurgitation is not demonstrated. No evidence of tricuspid stenosis.  Aortic Valve: The aortic valve is normal in structure. Aortic valve regurgitation is not visualized. No aortic stenosis is present. Aortic valve mean gradient measures 2.0 mmHg. Aortic valve peak gradient measures 4.5  mmHg. Aortic valve area, by VTI measures 2.58 cm.  Pulmonic Valve: The pulmonic valve was normal in structure. Pulmonic valve regurgitation is not visualized. No evidence of pulmonic stenosis.  Aorta: The aortic root is normal in size and structure.  Venous: The inferior vena cava is normal in size with greater than 50% respiratory variability, suggesting right atrial pressure of 3 mmHg.  IAS/Shunts: No atrial level shunt detected by color flow Doppler.   LEFT VENTRICLE PLAX 2D LVIDd:         3.60 cm   Diastology LVIDs:         2.60 cm   LV e' medial:    7.40 cm/s LV PW:         1.20 cm   LV E/e' medial:  6.8 LV IVS:        1.30 cm   LV e' lateral:   8.70 cm/s LVOT diam:     1.90 cm   LV E/e' lateral: 5.8 LV SV:         45 LV SV Index:   26 LVOT Area:     2.84 cm   RIGHT VENTRICLE RV S prime:     9.25 cm/s TAPSE (M-mode): 1.7 cm  LEFT ATRIUM             Index        RIGHT ATRIUM           Index LA diam:        2.60 cm 1.48 cm/m   RA Area:     12.30 cm LA Vol (A2C):   30.8 ml 17.56 ml/m  RA Volume:   25.60 ml  14.59 ml/m LA Vol (A4C):   34.1 ml 19.44 ml/m LA Biplane Vol: 34.4 ml 19.61  ml/m AORTIC VALVE AV Area (Vmax):    2.34 cm AV Area (Vmean):   2.45 cm AV Area (VTI):     2.58 cm AV Vmax:           106.00 cm/s AV Vmean:          71.500 cm/s AV VTI:            0.175 m AV Peak Grad:      4.5 mmHg AV Mean Grad:      2.0 mmHg LVOT Vmax:         87.50 cm/s LVOT Vmean:        61.700 cm/s LVOT VTI:          0.159 m LVOT/AV VTI ratio: 0.91  AORTA Ao Root diam: 3.40 cm  MITRAL VALVE MV Area (PHT): 3.23 cm    SHUNTS MV Decel Time: 235 msec    Systemic VTI:  0.16 m MV E velocity: 50.10 cm/s  Systemic Diam: 1.90 cm MV A velocity: 68.10 cm/s MV E/A ratio:  0.74  Kardie Tobb DO Electronically signed by Thomasene Ripple DO Signature Date/Time: 05/27/2023/1:50:36 PM    Final     CT SCANS  CT CARDIAC SCORING (SELF PAY ONLY) 02/27/2015  Addendum 03/10/2015  7:50 AM ADDENDUM REPORT: 03/10/2015 07:48  EXAM: OVER-READ INTERPRETATION  CT CHEST  The following report is an over-read performed by radiologist Dr. Irma Newness [facility] on 03/10/2015. This over-read does not include interpretation of cardiac or coronary anatomy or pathology. The coronary calcium score interpretation by the cardiologist is attached.  COMPARISON:  Chest CT 02/27/2015  FINDINGS: The visualized mediastinum appears unremarkable. There is no pericardial or pleural effusion. Mild linear scarring or  atelectasis at both lung bases.  Heterogeneous hepatic steatosis noted. The visualized upper abdomen is otherwise unremarkable.  IMPRESSION: No significant extracardiac findings within the lower chest.   Electronically Signed By: Carey Bullocks M.D. On: 03/10/2015 07:48  Narrative EXAM: Risk stratification  Coronary Calcium Score  TECHNIQUE: The patient was scanned on a Siemens Sensation 16 slice scanner. Axial non-contrast 3mm slices were carried out through the heart. The data set was analyzed on a dedicated work station and scored using the Agatson  method.  FINDINGS: Non-cardiac:  See separate report from [facility].  Ascending Aorta:  3.2 cm  Pericardium: Normal  Coronary arteries:  Small area of calcification in the mid LAD  IMPRESSION: Coronary calcium score of 2. This was 32nd percentile for age and sex matched control.  Charlton Haws  Electronically Signed: By: Charlton Haws M.D. On: 02/27/2015 17:01           Current Reported Medications:.    Current Meds  Medication Sig   albuterol (VENTOLIN HFA) 108 (90 Base) MCG/ACT inhaler Inhale 2 puffs into the lungs as needed for wheezing or shortness of breath.   aspirin EC 81 MG tablet Take 1 tablet (81 mg total) by mouth daily. Swallow whole.   atorvastatin (LIPITOR) 80 MG tablet Take 1 tablet (80 mg total) by mouth daily.   cetirizine (ZYRTEC) 10 MG tablet Take 10 mg by mouth daily as needed for allergies.   icosapent Ethyl (VASCEPA) 1 g capsule Take 2 capsules (2 g total) by mouth 2 (two) times daily.   isosorbide mononitrate (IMDUR) 30 MG 24 hr tablet Take 0.5 tablets (15 mg total) by mouth daily. Take at night   nitroGLYCERIN (NITROSTAT) 0.4 MG SL tablet Place 1 tablet (0.4 mg total) under the tongue every 5 (five) minutes as needed for chest pain.   Omega-3 Fatty Acids (OMEGA-3 FISH OIL) 500 MG CAPS Take 500 mg by mouth daily. Take 2 daily 1000 mg/day   prasugrel (EFFIENT) 10 MG TABS tablet Take 1 tablet (10 mg total) by mouth daily.   [DISCONTINUED] losartan (COZAAR) 25 MG tablet Take 0.5 tablets (12.5 mg total) by mouth daily.   [DISCONTINUED] metoprolol succinate (TOPROL-XL) 25 MG 24 hr tablet Take 0.5 tablets (12.5 mg total) by mouth daily.   Physical Exam:    VS:  BP 113/78 (BP Location: Left Arm, Cuff Size: Normal)   Pulse 88   Ht 5\' 6"  (1.676 m)   Wt 145 lb (65.8 kg)   SpO2 96%   BMI 23.40 kg/m    Wt Readings from Last 3 Encounters:  06/13/23 145 lb (65.8 kg)  06/09/23 146 lb 6.4 oz (66.4 kg)  05/30/23 145 lb 1 oz (65.8 kg)   GEN: Well  nourished, well developed in no acute distress NECK: No JVD; No carotid bruits CARDIAC: RRR, no murmurs, rubs, gallops, right radial and groin sites with no evidence of hematoma or infection.  RESPIRATORY:  Clear to auscultation without rales, wheezing or rhonchi  ABDOMEN: Soft, non-tender, non-distended EXTREMITIES:  No edema; No acute deformity   Asessement and Plan:.    CAD/Chest pain/Shortness of breath: S/p STEMI 05/2023 treated with DES to RCA, staged DES to LAD. Echocardiogram 05/27/23 indicated LVEF 55 to 60%, mild LVH and no significant valvular disease, no evidence of pericardial effusion. Today he reports chest pain described as a bandlike sensation around his chest accompanied by shortness of breath, occurring with exertion and at rest, worsens with inhalation.  He is averaging 4  episodes daily. He noted increased discomfort when laying down on exam table today. He has not taken sublingual nitroglycerin.  Reviewed case and EKG with Dr. Herbie Baltimore (DOD at [facility] today) who felt T wave changes were evolutionary changes post STEMI.  He recommended discontinuation of Toprol and starting of Imdur 15 mg daily as well as urgent echocardiogram to rule out new wall motion abnormalities as well as pericardial effusion. Will delay starting of cardiac rehab pending evaluation and follow up visit. Reviewed ED precautions. Check CBC, BMET. Will also check Sed rate and CRP to rule out Dressler's. Continue aspirin and Effient for 12 months. Continue Lipitor. Refill of Effient, Lipitor and sublingual nitroglycerin sent to his home pharmacy.  Chest pain with both typical and atypical features. If stent occluded would expect more chest pain and worsened EKG changes. Will rule out Dressler's and start anti anginal regimen as well as keep close follow up.  Right radial cath site and groin site clean and intact, no evidence of infection or hematoma.  Patient provided with work note, will plan to return to work  after 07/11/23, pending evaluation and follow up visit.   Hyperlipidemia: LDL goal less than 70.  Last lipid profile on 05/27/2023 indicated total cholesterol 351, HDL 58, LDL 231 and triglycerides 469, LP (a) 8.9. Started on 80 mg lipitor, referred to pharm D lipid clinic for consideration of  PCSK9 inhibitor. For elevated triglycerides will start Vascepa 2 g twice daily.   Prediabetes: Hemoglobin A1C 5.8%.  Recommended dietary adjustments.  Disposition: F/u with Reather Littler, NP, Bettina Gavia, PA or [name] in 2-3 weeks.   Signed, Rip Harbour, NP

## 2023-06-13 ENCOUNTER — Encounter: Payer: Self-pay | Admitting: Physician Assistant

## 2023-06-13 ENCOUNTER — Ambulatory Visit: Payer: 59 | Attending: Physician Assistant | Admitting: Cardiology

## 2023-06-13 VITALS — BP 113/78 | HR 88 | Ht 66.0 in | Wt 145.0 lb

## 2023-06-13 DIAGNOSIS — I25110 Atherosclerotic heart disease of native coronary artery with unstable angina pectoris: Secondary | ICD-10-CM | POA: Diagnosis not present

## 2023-06-13 DIAGNOSIS — Z955 Presence of coronary angioplasty implant and graft: Secondary | ICD-10-CM

## 2023-06-13 DIAGNOSIS — R0602 Shortness of breath: Secondary | ICD-10-CM

## 2023-06-13 DIAGNOSIS — R079 Chest pain, unspecified: Secondary | ICD-10-CM

## 2023-06-13 DIAGNOSIS — E785 Hyperlipidemia, unspecified: Secondary | ICD-10-CM | POA: Diagnosis not present

## 2023-06-13 DIAGNOSIS — Z0001 Encounter for general adult medical examination with abnormal findings: Secondary | ICD-10-CM | POA: Diagnosis not present

## 2023-06-13 MED ORDER — PRASUGREL HCL 10 MG PO TABS: 10.0000 mg | ORAL_TABLET | Freq: Every day | ORAL | 3 refills | Status: DC

## 2023-06-13 MED ORDER — ICOSAPENT ETHYL 1 G PO CAPS: 2.0000 g | ORAL_CAPSULE | Freq: Two times a day (BID) | ORAL | 6 refills | Status: DC

## 2023-06-13 MED ORDER — NITROGLYCERIN 0.4 MG SL SUBL: 0.4000 mg | SUBLINGUAL_TABLET | SUBLINGUAL | 3 refills | Status: AC | PRN

## 2023-06-13 MED ORDER — ATORVASTATIN CALCIUM 80 MG PO TABS: 80.0000 mg | ORAL_TABLET | Freq: Every day | ORAL | 3 refills | Status: DC

## 2023-06-13 MED ORDER — ISOSORBIDE MONONITRATE ER 30 MG PO TB24: 15.0000 mg | ORAL_TABLET | Freq: Every day | ORAL | 6 refills | Status: DC

## 2023-06-13 NOTE — Patient Instructions (Signed)
Medication Instructions:  STOP METOPROLOL STOP LOSARTAN START IMDUR 15MG  AT NIGHT START VASCEPA 2GRAMS TWICE DAILY *If you need a refill on your cardiac medications before your next appointment, please call your pharmacy*  Lab Work: CBC,BMET, SED RATE AND CRP TODAY If you have labs (blood work) drawn today and your tests are completely normal, you will receive your results only by:   MyChart Message (if you have MyChart) OR  A paper copy in the mail If you have any lab test that is abnormal or we need to change your treatment, we will call you to review the results.  Testing/Procedures: Your physician has requested that you have an URGENT echocardiogram. Echocardiography is a painless test that uses sound waves to create images of your heart. It provides your doctor with information about the size and shape of your heart and how well your heart's chambers and valves are working. This procedure takes approximately one hour. There are no restrictions for this procedure. Please do NOT wear cologne, perfume, aftershave, or lotions (deodorant is allowed). Please arrive 15 minutes prior to your appointment time.    Follow-Up: At [facility], you and your health needs are our priority.  As part of our continuing mission to provide you with exceptional heart care, we have created designated Provider Care Teams.  These Care Teams include your primary Cardiologist (physician) and Advanced Practice Providers (APPs -  Physician Assistants and Nurse Practitioners) who all work together to provide you with the care you need, when you need it.  Your next appointment:   2-3 week(s)  Provider:   Verne Carrow, MD  or [name], PA-C or Reather Littler, NP        Other Instructions RETURN TO WORK 07-01-23-LETTER PROVIDED

## 2023-06-14 ENCOUNTER — Encounter (HOSPITAL_COMMUNITY): Payer: Medicare Other

## 2023-06-14 ENCOUNTER — Encounter: Payer: Self-pay | Admitting: Cardiology

## 2023-06-14 ENCOUNTER — Telehealth: Payer: Self-pay | Admitting: Cardiovascular Disease

## 2023-06-14 LAB — BASIC METABOLIC PANEL
BUN/Creatinine Ratio: 19 (ref 10–24)
BUN: 21 mg/dL (ref 8–27)
CO2: 23 mmol/L (ref 20–29)
Calcium: 9.6 mg/dL (ref 8.6–10.2)
Chloride: 103 mmol/L (ref 96–106)
Creatinine, Ser: 1.12 mg/dL (ref 0.76–1.27)
Glucose: 100 mg/dL — ABNORMAL HIGH (ref 70–99)
Potassium: 4.3 mmol/L (ref 3.5–5.2)
Sodium: 142 mmol/L (ref 134–144)
eGFR: 73 mL/min/{1.73_m2} (ref >59)

## 2023-06-14 LAB — CBC
Hematocrit: 39.4 % (ref 37.5–51.0)
Hemoglobin: 12.5 g/dL — ABNORMAL LOW (ref 13.0–17.7)
MCH: 29.3 pg (ref 26.6–33.0)
MCHC: 31.7 g/dL (ref 31.5–35.7)
MCV: 93 fL (ref 79–97)
Platelets: 382 10*3/uL (ref 150–450)
RBC: 4.26 x10E6/uL (ref 4.14–5.80)
RDW: 13.9 % (ref 11.6–15.4)
WBC: 4.6 10*3/uL (ref 3.4–10.8)

## 2023-06-14 LAB — SEDIMENTATION RATE: Sed Rate: 18 mm/h (ref 0–30)

## 2023-06-14 LAB — C-REACTIVE PROTEIN: CRP: 8 mg/L (ref 0–10)

## 2023-06-14 MED ORDER — RANOLAZINE ER 500 MG PO TB12: 500.0000 mg | ORAL_TABLET | Freq: Two times a day (BID) | ORAL | 5 refills | Status: DC

## 2023-06-14 NOTE — Telephone Encounter (Signed)
Pt c/o BP issue: STAT if pt c/o blurred vision, one-sided weakness or slurred speech  1. What are your last 5 BP readings?  10/22: 88/51 before eating             98/55 a few hours later after eating   2. Are you having any other symptoms (ex. Dizziness, headache, blurred vision, passed out)?  SOB + chest tightness at night   3. What is your BP issue?  BP is low.

## 2023-06-14 NOTE — Telephone Encounter (Signed)
Spoke with the patient who reports that his blood pressure has been running low this morning. 88/51 and then 98/55 a couple of hours later. He was see by Reather Littler, NP yesterday and metoprolol and losartan were stopped. He was started on Imdur 15 mg. He did take the Imdur last night. He reports feeling a bit lightheaded. He has had breakfast and lunch today. He states that he has had some water but not much. Encouraged him to change positions slowly and increase water intake. Current blood pressure while on the phone is 94/57. He does report still having chest tightness and shortness of breath that was discussed at his office visit yesterday. He is scheduled for an echo tomorrow and follow up with [name] on 11/3.

## 2023-06-14 NOTE — Telephone Encounter (Signed)
92/53 around 3pm today.  Now - 104/65, 79 while on the phone.   Fatigued and short of breath, tight around chest during and unable to do the amount of exercise he could do yesterday morning, but the symptoms are unchanged from yesterday's office visit.  He will continue to hydrate.  Echo is tomorrow morning.  Will start Ranexa tonight and take BID.

## 2023-06-14 NOTE — Telephone Encounter (Signed)
Pt was returning nurse call and is requesting a callback. Please advise. 

## 2023-06-14 NOTE — Telephone Encounter (Signed)
Left message for patient to call back  

## 2023-06-15 ENCOUNTER — Ambulatory Visit (HOSPITAL_COMMUNITY): Payer: Medicare Other | Attending: Cardiology

## 2023-06-15 ENCOUNTER — Telehealth: Payer: Self-pay | Admitting: Cardiology

## 2023-06-15 ENCOUNTER — Other Ambulatory Visit (HOSPITAL_COMMUNITY): Payer: Self-pay

## 2023-06-15 DIAGNOSIS — Z0279 Encounter for issue of other medical certificate: Secondary | ICD-10-CM

## 2023-06-15 DIAGNOSIS — R079 Chest pain, unspecified: Secondary | ICD-10-CM | POA: Diagnosis present

## 2023-06-15 DIAGNOSIS — R0602 Shortness of breath: Secondary | ICD-10-CM | POA: Insufficient documentation

## 2023-06-15 LAB — ECHOCARDIOGRAM COMPLETE
Area-P 1/2: 4.31 cm2
MV M vel: 4.1 m/s
MV Peak grad: 67.2 mm[Hg]
Radius: 0.7 cm
S' Lateral: 3 cm

## 2023-06-15 NOTE — Telephone Encounter (Signed)
Pt filled out 2 FMLA forms (ROI & Billing). Payment ($29) received.  Paperwork given to [employer] to complete.  JB, 06-15-23

## 2023-06-15 NOTE — Telephone Encounter (Signed)
FMLA paperwork completed.  All forms faxed & scanned into pt's chart.  JB, 06-15-23

## 2023-06-16 ENCOUNTER — Encounter (HOSPITAL_COMMUNITY): Payer: Medicare Other

## 2023-06-20 NOTE — Telephone Encounter (Signed)
Pt dropped off Short-term disability ([employer]) paperwork. Paperwork given to [employer]. for [place] [place] to complete.  JB, 06-20-23

## 2023-06-21 ENCOUNTER — Encounter (HOSPITAL_COMMUNITY): Payer: Medicare Other

## 2023-06-21 NOTE — Telephone Encounter (Signed)
Short-term Disability paperwork faxed to [facility] Alesia Banda W.). Per [facility], paperwork needs to be completed by Attending Physician, which is [name].   JB, 06-21-23

## 2023-06-22 ENCOUNTER — Telehealth: Payer: Self-pay | Admitting: Cardiovascular Disease

## 2023-06-22 NOTE — Telephone Encounter (Signed)
We received a Short Term Disability form from [employer].  Patient has an appointment with [name] on 06/27/23.  Form has been given to Dr. Gibson Ramp nurse.  Payment received and release of information signed at our [facility] office.  Paschal Dopp, Team Lead, will bring the documents and payment to the [facility] office tomorrow.

## 2023-06-23 ENCOUNTER — Encounter (HOSPITAL_COMMUNITY): Payer: Medicare Other

## 2023-06-23 NOTE — Telephone Encounter (Signed)
I received the patient's payment and it is in the safe.He will sign his forms on Monday when he comes in.  I have contacted [employer] two times to get a clearer form for [name] to fill out.  The one we have is very blurry-hard to read.

## 2023-06-27 ENCOUNTER — Ambulatory Visit: Payer: 59

## 2023-06-27 ENCOUNTER — Ambulatory Visit: Payer: 59 | Admitting: Student

## 2023-06-27 ENCOUNTER — Telehealth: Payer: Self-pay | Admitting: Pharmacist

## 2023-06-27 ENCOUNTER — Encounter: Payer: Self-pay | Admitting: Cardiovascular Disease

## 2023-06-27 ENCOUNTER — Ambulatory Visit: Payer: 59 | Attending: Cardiovascular Disease | Admitting: Cardiovascular Disease

## 2023-06-27 VITALS — BP 112/78 | HR 80 | Ht 66.0 in | Wt 147.6 lb

## 2023-06-27 DIAGNOSIS — E785 Hyperlipidemia, unspecified: Secondary | ICD-10-CM

## 2023-06-27 DIAGNOSIS — Z0279 Encounter for issue of other medical certificate: Secondary | ICD-10-CM

## 2023-06-27 DIAGNOSIS — E7800 Pure hypercholesterolemia, unspecified: Secondary | ICD-10-CM | POA: Diagnosis not present

## 2023-06-27 DIAGNOSIS — I25118 Atherosclerotic heart disease of native coronary artery with other forms of angina pectoris: Secondary | ICD-10-CM | POA: Diagnosis not present

## 2023-06-27 MED ORDER — PRASUGREL HCL 10 MG PO TABS: 10.0000 mg | ORAL_TABLET | Freq: Every day | ORAL | 3 refills | Status: DC

## 2023-06-27 MED ORDER — ROSUVASTATIN CALCIUM 20 MG PO TABS: 20.0000 mg | ORAL_TABLET | Freq: Every day | ORAL | 3 refills | Status: DC

## 2023-06-27 NOTE — Progress Notes (Signed)
Chief Complaint  Patient presents with   Follow-up    CAD   History of Present Illness: 67 yo male with history of CAD, HTN and HLD who is here today for follow up. He was admitted with an inferior STEMI on 05/27/23 and found to have a severe distal RCA stenosis which was treated with a drug eluting stent. He had staged PCI of the proximal LAD with placement of a drug eluting stent. He was discharged on ASA and Effient. Echo October 2024 with LVEF=55-60%. No valve disease. He was seen in our office 06/13/23 by Reather Littler, NP and reported chest pain. His pain was not felt to be ischemia related and his EKG showed no acute changes. Pattern consistent with recent MI. Imdur was added. Limited echo 06/15/23 with no wall motion abnormalities and no pericardial effusion. Mild MR. He did not tolerate the Imdur secondary to headaches. He was started on Ranexa and has tolerated this. He stopped his Lipitor due to diarrhea but is not sure if this was related.   He is here today for follow up. The patient denies any palpitations, lower extremity edema, orthopnea, PND, dizziness, near syncope or syncope. He continues to have some dyspnea with exertion and mild pressure in his chest. The chest pressure occurs with rest and exertion.   Primary Care Physician: Assunta Found, MD   Past Medical History:  Diagnosis Date   Seasonal allergies     Past Surgical History:  Procedure Laterality Date   COLONOSCOPY N/A 03/14/2015   Procedure: COLONOSCOPY;  Surgeon: West Bali, MD;  Location: [facility];  Service: Endoscopy;  Laterality: N/A;  8:30 AM   CORONARY STENT INTERVENTION N/A 05/30/2023   Procedure: CORONARY STENT INTERVENTION;  Surgeon: Yates Decamp, MD;  Location: [facility] INVASIVE CV LAB;  Service: Cardiovascular;  Laterality: N/A;   CORONARY/GRAFT ACUTE MI REVASCULARIZATION N/A 05/27/2023   Procedure: Coronary/Graft Acute MI Revascularization;  Surgeon: Kathleene Hazel, MD;  Location: [facility] INVASIVE  CV LAB;  Service: Cardiovascular;  Laterality: N/A;   HERNIA REPAIR  08/24/2011   KNEE ARTHROSCOPY Right 08/23/2000   LEFT HEART CATH AND CORONARY ANGIOGRAPHY N/A 05/27/2023   Procedure: LEFT HEART CATH AND CORONARY ANGIOGRAPHY;  Surgeon: Kathleene Hazel, MD;  Location: [facility] INVAS[facility] CV LAB;  Service: Cardiovascular;  Laterality: N/A;    Current Outpatient Medications  Medication Sig Dispense Refill   albuterol (VENTOLIN HFA) 108 (90 Base) MCG/ACT inhaler Inhale 2 puffs into the lungs as needed for wheezing or shortness of breath.     aspirin EC 81 MG tablet Take 1 tablet (81 mg total) by mouth daily. Swallow whole. 90 tablet 3   cetirizine (ZYRTEC) 10 MG tablet Take 10 mg by mouth daily as needed for allergies.     Cholecalciferol (D3 PO) Take 1 tablet by mouth daily.     icosapent Ethyl (VASCEPA) 1 g capsule Take 2 capsules (2 g total) by mouth 2 (two) times daily. 120 capsule 6   nitroGLYCERIN (NITROSTAT) 0.4 MG SL tablet Place 1 tablet (0.4 mg total) under the tongue every 5 (five) minutes as needed for chest pain. 25 tablet 3   Omega-3 Fatty Acids (OMEGA-3 FISH OIL) 500 MG CAPS Take 500 mg by mouth daily. Take 2 daily 1000 mg/day     prasugrel (EFFIENT) 10 MG TABS tablet Take 1 tablet (10 mg total) by mouth daily. 90 tablet 3   ranolazine (RANEXA) 500 MG 12 hr tablet Take 1 tablet (500 mg total)  by mouth 2 (two) times daily. 60 tablet 5   rosuvastatin (CRESTOR) 20 MG tablet Take 1 tablet (20 mg total) by mouth daily. 90 tablet 3   No current facility-administered medications for this visit.    Allergies  Allergen Reactions   Tequin [Gatifloxacin]     Mouth got numb    Social History   Socioeconomic History   Marital status: Married    Spouse name: Clydie Braun   Number of children: 2   Years of education: College   Highest education level: Not on file  Occupational History   Occupation: Programme researcher, broadcasting/film/video  Tobacco Use   Smoking status: Never   Smokeless tobacco: Never   Vaping Use   Vaping status: Never Used  Substance and Sexual Activity   Alcohol use: No    Alcohol/week: 0.0 standard drinks of alcohol    Comment: Quit in 1981   Drug use: No   Sexual activity: Yes  Other Topics Concern   Not on file  Social History Narrative   Lives at home with wife and son.   Caffeine use: 1 cups coffee per day   Drinks tea occass   Social Determinants of Health   Financial Resource Strain: Not on file  Food Insecurity: Low Risk  (04/20/2023)   Received from [facility]   Hunger Vital Sign    Worried About Running Out of Food in the Last Year: Never true    Ran Out of Food in the Last Year: Never true  Transportation Needs: No Transportation Needs (04/20/2023)   Received from [facility]    In the past 12 months, has lack of reliable transportation kept you from medical appointments, meetings, work or from getting things needed for daily living? : No  Physical Activity: Not on file  Stress: Not on file  Social Connections: Not on file  Intimate Partner Violence: Unknown (10/10/2022)   [facility]   Domestic Violence Screen    Physical Abuse Risk: Unknown    Verbal Abuse Risk: Unknown    Family History  Problem Relation Age of Onset   CAD Brother    Parkinsonism Neg Hx     Review of Systems:  As stated in the HPI and otherwise negative.   BP 112/78   Pulse 80   Ht 5\' 6"  (1.676 m)   Wt 67 kg   SpO2 97%   BMI 23.82 kg/m   Physical Examination: General: Well developed, well nourished, NAD  HEENT: OP clear, mucus membranes moist  SKIN: warm, dry. No rashes. Neuro: No focal deficits  Musculoskeletal: Muscle strength 5/5 all ext  Psychiatric: Mood and affect normal  Neck: No JVD, no carotid bruits, no thyromegaly, no lymphadenopathy.  Lungs:Clear bilaterally, no wheezes, rhonci, crackles Cardiovascular: Regular rate and rhythm. No murmurs, gallops or rubs. Abdomen:Soft.  Bowel sounds present. Non-tender.  Extremities: No lower extremity edema. Pulses are 2 + in the bilateral DP/PT.  EKG:  EKG is not ordered today. The ekg ordered today demonstrates   Recent Labs: 05/27/2023: ALT 41 05/28/2023: Magnesium 2.1 06/13/2023: BUN 21; Creatinine, Ser 1.12; Hemoglobin 12.5; Platelets 382; Potassium 4.3; Sodium 142   Lipid Panel    Component Value Date/Time   CHOL 351 (H) 05/27/2023 0428   TRIG 312 (H) 05/27/2023 0428   HDL 58 05/27/2023 0428   CHOLHDL 6.1 05/27/2023 0428   VLDL 62 (H) 05/27/2023 0428   LDLCALC 231 (H) 05/27/2023 5409  Wt Readings from Last 3 Encounters:  06/27/23 67 kg  06/13/23 65.8 kg  06/09/23 66.4 kg      Assessment and Plan:   1. CAD with angina: He continues to have atypical chest pain. 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 Recent echo with normal LV wall motion and no evidence of pericardial effusion. Continue DAPT with ASA/Effient for one year post MI. Will start Crestor 20 mg daily and see if he tolerates this.    2. HLD: Goal LDL under 70. He has been on high dose Lipitor but has stopped this as he thinks it irritating his stomach. LDL was 231 prior to starting Lipitor. I think Repatha will be necessary to get him down to his goal LDL. He has a visit arranged with the PharmD lipid clinic for today. This was arranged when he was discharged from the hospital. He and I discussed trying Crestor 20 mg daily to see how he tolerates this. The statin alone will likely not be enough to get his LDL down to goal of 70. I will ask our PharmD to review Repatha as an option as well and welcome any other suggestions or change in plan.   Short term disability paperwork was filled out today.   Labs/ tests ordered today include:   Orders Placed This Encounter  Procedures   Lipid Profile   Hepatic function panel   Disposition:   F/U with me in 3 months.    Signed, Verne Carrow, MD, [facility] 06/27/2023 2:27 PM    [facility] [address] [place], [place], [place]  [address] Phone: [phone]; Fax: [phone]

## 2023-06-27 NOTE — Telephone Encounter (Signed)
Form completed during visit with [name] today.  Patient returned to the front desk area with the red folder for completion of the forms.

## 2023-06-27 NOTE — Patient Instructions (Signed)
Your Results:             Your most recent labs Goal  Total Cholesterol 351 < 200  Triglycerides 312 < 150  HDL (happy/good cholesterol) 58 > 40  LDL (lousy/bad cholesterol 231 < 55   Medication changes: We will start the process to get PCSK9i ( Repatha or Praluent)  covered by your insurance.  Once the prior authorization is complete, we will call you to let you know and confirm pharmacy information.     Praluent is a cholesterol medication that improved your body's ability to get rid of "bad cholesterol" known as LDL. It can lower your LDL up to 60%. It is an injection that is given under the skin every 2 weeks. The most common side effects of Praluent include runny nose, symptoms of the common cold, rarely flu or flu-like symptoms, back/muscle pain in about 3-4% of the patients, and redness, pain, or bruising at the injection site.    Repatha is a cholesterol medication that improved your body's ability to get rid of "bad cholesterol" known as LDL. It can lower your LDL up to 60%! It is an injection that is given under the skin every 2 weeks. The most common side effects of Repatha include runny nose, symptoms of the common cold, rarely flu or flu-like symptoms, back/muscle pain in about 3-4% of the patients, and redness, pain, or bruising at the injection site.   Lab orders: We want to repeat labs after 2-3 months.  We will send you a lab order to remind you once we get closer to that time.

## 2023-06-27 NOTE — Progress Notes (Unsigned)
Patient ID: [name]                 DOB: 08/31/1955                    MRN: [id number]      HPI: Juan Schultz is a 67 y.o. male patient referred to lipid clinic by [name], PA. PMH is significant for recent STEMI s/p coronary stent, CAD, HLD   Patient saw [name] on 06/13/2023 for Hyperlipidemia Lipitor 80 mg was started  LDL goal less than 70.  Last lipid profile on 05/27/2023 indicated total cholesterol 351, HDL 58, LDL 231 and triglycerides 102, LP (a) 8.9.   Patient presented today for lipid clinic. Reports he was not able to tolerate Lipitor 80 mg. It as causing severe upset stomach. Today he saw [name] he has change Lipitor 80 to Crestor 20 mg. Patient was put on Vascepa on 10/21. Reports he tolerates that well. He has implemented significant changes to his diet has lost almost 24 lbs since Jan.Reviewed options for lowering LDL cholesterol, including ezetimibe, PCSK-9 inhibitors, bempedoic acid and inclisiran.  Discussed mechanisms of action, dosing, side effects and potential decreases in LDL cholesterol.  Also reviewed cost information and potential options for patient assistance.  Current Medications: Lipitor 80 mg daily  Intolerances: Lipitor 80 mg - upset stomach  Risk Factors: recent STEMI s/p coronary stent, CAD, HLD, family hx of premature CAD - brother - 11 stents; 1st one in his early 45's  LDL goal: <55 mg/dl   Diet: has cut down on carb, no sweets lost 24 lbs since jan and lot 4 more lbs since recent MI  Eat out - once week  Drink: coffee and water   Exercise: 1/2 mile walking every day.   Family History:  Relation Problem Comments  Mother (Alive) MI at age 24   Father (Alive) Stents -in his 52's   Brother Metallurgist) CAD 11 stents at age 52st one at age 34       Social History:  Alcohol: none  Smoking: none   Labs: Lipid Panel     Component Value Date/Time   CHOL 351 (H) 05/27/2023 0428   TRIG 312 (H) 05/27/2023 0428   HDL 58 05/27/2023  0428   CHOLHDL 6.1 05/27/2023 0428   VLDL 62 (H) 05/27/2023 0428   LDLCALC 231 (H) 05/27/2023 0428    Past Medical History:  Diagnosis Date   Seasonal allergies     Current Outpatient Medications on File Prior to Visit  Medication Sig Dispense Refill   albuterol (VENTOLIN HFA) 108 (90 Base) MCG/ACT inhaler Inhale 2 puffs into the lungs as needed for wheezing or shortness of breath.     aspirin EC 81 MG tablet Take 1 tablet (81 mg total) by mouth daily. Swallow whole. 90 tablet 3   cetirizine (ZYRTEC) 10 MG tablet Take 10 mg by mouth daily as needed for allergies.     Cholecalciferol (D3 PO) Take 1 tablet by mouth daily.     icosapent Ethyl (VASCEPA) 1 g capsule Take 2 capsules (2 g total) by mouth 2 (two) times daily. 120 capsule 6   nitroGLYCERIN (NITROSTAT) 0.4 MG SL tablet Place 1 tablet (0.4 mg total) under the tongue every 5 (five) minutes as needed for chest pain. 25 tablet 3   Omega-3 Fatty Acids (OMEGA-3 FISH OIL) 500 MG CAPS Take 500 mg by mouth daily. Take 2 daily 1000 mg/day  prasugrel (EFFIENT) 10 MG TABS tablet Take 1 tablet (10 mg total) by mouth daily. 90 tablet 3   ranolazine (RANEXA) 500 MG 12 hr tablet Take 1 tablet (500 mg total) by mouth 2 (two) times daily. 60 tablet 5   rosuvastatin (CRESTOR) 20 MG tablet Take 1 tablet (20 mg total) by mouth daily. 90 tablet 3   No current facility-administered medications on file prior to visit.    Allergies  Allergen Reactions   Tequin [Gatifloxacin]     Mouth got numb    Assessment/Plan:  1. Hyperlipidemia -  Problem  Hyperlipidemia With Target Low Density Lipoprotein (Ldl) Cholesterol Less Than 55 Mg/Dl   Current Medications: Crestor 20 mg daily  Intolerances: Lipitor 80 mg - upset stomach  Risk Factors: recent STEMI s/p coronary stent, CAD, HLD, family hx of premature CAD - brother - 11 stents; 1st one in his early 31's  LDL goal: <55 mg/dl  Last lipid lab: LDLc 161, TC 351,TG 312, HDL 58     Hyperlipidemia  with target low density lipoprotein (LDL) cholesterol less than 55 mg/dL Assessment:  LDL goal: <55 mg/dl last LDLc  096 mg/dl (04/54/0981) Intolerance to atorvastatin 80 mg, statin switch to Crestor 20 mg  Tolerates Vascepa well TG 312 mg /dl; goal <191 ( therapy started Oct 2024 f/u lab in 3 months)  Discussed next potential options (ezetimibe PCSK-9 inhibitors, bempedoic acid and inclisiran); cost, dosing efficacy, side effects  Patient follows heart healthy diet   Plan: Start taking Crestor 20 mg daily  At given risk factors and LDLc level reasonable to consider PCSK9i in addition to high intensity statin;Will apply for PA for PCSK9i; will inform patient upon approval Lipid lab due in 2-3 months after starting PCSK9i    Thank you,  [name], Pharm.D [facility] [address], [place], [place] [address]  Phone: [phone]; Fax: [phone]

## 2023-06-27 NOTE — Telephone Encounter (Signed)
Completed [employer] disability form scanned into chart. Billing notified.

## 2023-06-27 NOTE — Patient Instructions (Signed)
Medication Instructions:  Your physician has recommended you make the following change in your medication:  1.) start rosuvastatin (Crestor) 20 mg - one tablet daily *If you need a refill on your cardiac medications before your next appointment, please call your pharmacy*   Lab Work: Please return in about 3 months for blood work (lipids, liver) If you have labs (blood work) drawn today and your tests are completely normal, you will receive your results only by: MyChart Message (if you have MyChart) OR A paper copy in the mail If you have any lab test that is abnormal or we need to change your treatment, we will call you to review the results.   Testing/Procedures: none   Follow-Up: 09/29/23 10:40 am

## 2023-06-27 NOTE — Telephone Encounter (Signed)
PCSK9i PA Requested

## 2023-06-28 ENCOUNTER — Other Ambulatory Visit (HOSPITAL_COMMUNITY): Payer: Self-pay

## 2023-06-28 ENCOUNTER — Encounter (HOSPITAL_COMMUNITY): Payer: 59

## 2023-06-28 ENCOUNTER — Ambulatory Visit: Payer: 59

## 2023-06-28 ENCOUNTER — Encounter: Payer: Self-pay | Admitting: Student

## 2023-06-28 ENCOUNTER — Telehealth: Payer: Self-pay | Admitting: Pharmacy Technician

## 2023-06-28 NOTE — Telephone Encounter (Signed)
Pharmacy Patient Advocate Encounter   Received notification from Pt Calls Messages that prior authorization for repatha is required/requested.   Insurance verification completed.   The patient is insured through [employer] .   Per test claim: PA required; PA submitted to above mentioned insurance via CoverMyMeds Key/confirmation #/EOC [id number] Status is pending

## 2023-06-28 NOTE — Assessment & Plan Note (Addendum)
Assessment:  LDL goal: <55 mg/dl last LDLc  540 mg/dl (98/06/9146) Intolerance to atorvastatin 80 mg, statin switch to Crestor 20 mg  Tolerates Vascepa well TG 312 mg /dl; goal <829 ( therapy started Oct 2024 f/u lab in 3 months)  Discussed next potential options (ezetimibe PCSK-9 inhibitors, bempedoic acid and inclisiran); cost, dosing efficacy, side effects  Patient follows heart healthy diet   Plan: Start taking Crestor 20 mg daily  At given risk factors and LDLc level reasonable to consider PCSK9i in addition to high intensity statin;Will apply for PA for PCSK9i; will inform patient upon approval Lipid lab due in 2-3 months after starting [facility]

## 2023-06-29 ENCOUNTER — Encounter (HOSPITAL_COMMUNITY): Payer: Self-pay | Admitting: *Deleted

## 2023-06-29 DIAGNOSIS — Z955 Presence of coronary angioplasty implant and graft: Secondary | ICD-10-CM

## 2023-06-29 DIAGNOSIS — I213 ST elevation (STEMI) myocardial infarction of unspecified site: Secondary | ICD-10-CM

## 2023-06-29 NOTE — Telephone Encounter (Signed)
Pharmacy Patient Advocate Encounter  Received notification from [employer] [facility] that Prior Authorization for repatha has been DENIED.  Full denial letter will be uploaded to the media tab. See denial reason below.   PA #/Case ID/Reference #: [id number]

## 2023-06-29 NOTE — Telephone Encounter (Signed)
Call patient to discuss denial reasons. He will be seeing Dr.McAlhany on Feb 6 will get updated lipid lab to see efficacy of current statin and will re-apply for PA for Repatha if LDLc still stays above the goal.

## 2023-06-29 NOTE — Progress Notes (Signed)
Cardiac Individual Treatment Plan  Patient Details  Name: Juan Schultz MRN: [id number] Date of Birth: 1955-12-29 Referring Provider:   Flowsheet Row CARDIAC REHAB PHASE II ORIENTATION from 06/09/2023 in [facility] CARDIAC REHABILITATION  Referring Provider Yates Decamp MD  [Primary Cardiologist: Dr. Cristal Deer McAlhany]       Initial Encounter Date:  Flowsheet Row CARDIAC REHAB PHASE II ORIENTATION from 06/09/2023 in [place] CARDIAC REHABILITATION  Date 06/09/23       Visit Diagnosis: ST elevation myocardial infarction (STEMI), unspecified artery [facility])  Status post coronary artery stent placement  Patient's Home Medications on Admission:  Current Outpatient Medications:    albuterol (VENTOLIN HFA) 108 (90 Base) MCG/ACT inhaler, Inhale 2 puffs into the lungs as needed for wheezing or shortness of breath., Disp: , Rfl:    aspirin EC 81 MG tablet, Take 1 tablet (81 mg total) by mouth daily. Swallow whole., Disp: 90 tablet, Rfl: 3   cetirizine (ZYRTEC) 10 MG tablet, Take 10 mg by mouth daily as needed for allergies., Disp: , Rfl:    Cholecalciferol (D3 PO), Take 1 tablet by mouth daily., Disp: , Rfl:    icosapent Ethyl (VASCEPA) 1 g capsule, Take 2 capsules (2 g total) by mouth 2 (two) times daily., Disp: 120 capsule, Rfl: 6   nitroGLYCERIN (NITROSTAT) 0.4 MG SL tablet, Place 1 tablet (0.4 mg total) under the tongue every 5 (five) minutes as needed for chest pain., Disp: 25 tablet, Rfl: 3   Omega-3 Fatty Acids (OMEGA-3 FISH OIL) 500 MG CAPS, Take 500 mg by mouth daily. Take 2 daily 1000 mg/day, Disp: , Rfl:    prasugrel (EFFIENT) 10 MG TABS tablet, Take 1 tablet (10 mg total) by mouth daily., Disp: 90 tablet, Rfl: 3   ranolazine (RANEXA) 500 MG 12 hr tablet, Take 1 tablet (500 mg total) by mouth 2 (two) times daily., Disp: 60 tablet, Rfl: 5   rosuvastatin (CRESTOR) 20 MG tablet, Take 1 tablet (20 mg total) by mouth daily., Disp: 90 tablet, Rfl: 3  Past Medical History: Past  Medical History:  Diagnosis Date   Seasonal allergies     Tobacco Use: Social History   Tobacco Use  Smoking Status Never  Smokeless Tobacco Never    Labs: Review Flowsheet       Latest Ref Rng & Units 05/27/2023  Labs for ITP Cardiac and Pulmonary Rehab  Cholestrol 0 - 200 mg/dL 063   LDL (calc) 0 - 99 mg/dL 016   HDL-C >01 mg/dL 58   Trlycerides <093 mg/dL 235   Hemoglobin T7D 4.8 - 5.6 % 5.8     Details            Capillary Blood Glucose: Lab Results  Component Value Date   GLUCAP 150 (H) 05/27/2023     Exercise Target Goals: Exercise Program Goal: Individual exercise prescription set using results from initial 6 min walk test and THRR while considering  patient's activity barriers and safety.   Exercise Prescription Goal: Starting with aerobic activity 30 plus minutes a day, 3 days per week for initial exercise prescription. Provide home exercise prescription and guidelines that participant acknowledges understanding prior to discharge.  Activity Barriers & Risk Stratification:  Activity Barriers & Cardiac Risk Stratification - 06/09/23 1436       Activity Barriers & Cardiac Risk Stratification   Activity Barriers Arthritis;Shortness of Breath;Chest Pain/Angina;Joint Problems   R knee is bone on bone   Cardiac Risk Stratification Moderate  6 Minute Walk:  6 Minute Walk     Row Name 06/09/23 1434         6 Minute Walk   Phase Initial     Distance 1440 feet     Walk Time 6 minutes     # of Rest Breaks 0     MPH 2.72     METS 3.75     RPE 11     Perceived Dyspnea  1     VO2 Peak 13.13     Symptoms Yes (comment)     Comments chest "cloudy" 4/10, SOB     Resting HR 91 bpm     Resting BP 102/64     Resting Oxygen Saturation  96 %     Exercise Oxygen Saturation  during 6 min walk 99 %     Max Ex. HR 108 bpm     Max Ex. BP 134/62     2 Minute Post BP 120/66              Oxygen Initial Assessment:   Oxygen  Re-Evaluation:   Oxygen Discharge (Final Oxygen Re-Evaluation):   Initial Exercise Prescription:  Initial Exercise Prescription - 06/09/23 1400       Date of Initial Exercise RX and Referring Provider   Date 06/09/23    Referring Provider Yates Decamp MD   Primary Cardiologist: Dr. Verne Carrow     Oxygen   Maintain Oxygen Saturation 88% or higher      Treadmill   MPH 2.7    Grade 0.5    Minutes 15    METs 3.25      REL-XR   Level 3    Speed 50    Minutes 15    METs 3      Prescription Details   Frequency (times per week) 2    Duration Progress to 30 minutes of continuous aerobic without signs/symptoms of physical distress      Intensity   THRR 40-80% of Max Heartrate 117-142    Ratings of Perceived Exertion 11-13    Perceived Dyspnea 0-4      Progression   Progression Continue to progress workloads to maintain intensity without signs/symptoms of physical distress.      Resistance Training   Training Prescription Yes    Weight 3 lb    Reps 10-15             Perform Capillary Blood Glucose checks as needed.  Exercise Prescription Changes:   Exercise Prescription Changes     Row Name 06/09/23 1400             Response to Exercise   Blood Pressure (Admit) 102/64       Blood Pressure (Exercise) 134/62       Blood Pressure (Exit) 120/66       Heart Rate (Admit) 91 bpm       Heart Rate (Exercise) 108 bpm       Heart Rate (Exit) 90 bpm       Oxygen Saturation (Admit) 96 %       Oxygen Saturation (Exercise) 99 %       Rating of Perceived Exertion (Exercise) 11       Perceived Dyspnea (Exercise) 1       Symptoms chest "cloudy", SOB       Comments walk test restuls                Exercise Comments:   Exercise  Goals and Review:   Exercise Goals     Row Name 06/09/23 1441             Exercise Goals   Increase Physical Activity Yes       Intervention Provide advice, education, support and counseling about physical  activity/exercise needs.;Develop an individualized exercise prescription for aerobic and resistive training based on initial evaluation findings, risk stratification, comorbidities and participant's personal goals.       Expected Outcomes Short Term: Attend rehab on a regular basis to increase amount of physical activity.;Long Term: Add in home exercise to make exercise part of routine and to increase amount of physical activity.;Long Term: Exercising regularly at least 3-5 days a week.       Increase Strength and Stamina Yes       Intervention Provide advice, education, support and counseling about physical activity/exercise needs.;Develop an individualized exercise prescription for aerobic and resistive training based on initial evaluation findings, risk stratification, comorbidities and participant's personal goals.       Expected Outcomes Short Term: Increase workloads from initial exercise prescription for resistance, speed, and METs.;Short Term: Perform resistance training exercises routinely during rehab and add in resistance training at home;Long Term: Improve cardiorespiratory fitness, muscular endurance and strength as measured by increased METs and functional capacity ( )       Able to understand and use rate of perceived exertion (RPE) scale Yes       Intervention Provide education and explanation on how to use RPE scale       Expected Outcomes Short Term: Able to use RPE daily in rehab to express subjective intensity level;Long Term:  Able to use RPE to guide intensity level when exercising independently       Able to understand and use Dyspnea scale Yes       Intervention Provide education and explanation on how to use Dyspnea scale       Expected Outcomes Short Term: Able to use Dyspnea scale daily in rehab to express subjective sense of shortness of breath during exertion;Long Term: Able to use Dyspnea scale to guide intensity level when exercising independently       Knowledge and  understanding of Target Heart Rate Range (THRR) Yes       Intervention Provide education and explanation of THRR including how the numbers were predicted and where they are located for reference       Expected Outcomes Short Term: Able to state/look up THRR;Long Term: Able to use THRR to govern intensity when exercising independently;Short Term: Able to use daily as guideline for intensity in rehab       Able to check pulse independently Yes       Intervention Provide education and demonstration on how to check pulse in carotid and radial arteries.;Review the importance of being able to check your own pulse for safety during independent exercise       Expected Outcomes Short Term: Able to explain why pulse checking is important during independent exercise;Long Term: Able to check pulse independently and accurately       Understanding of Exercise Prescription Yes       Intervention Provide education, explanation, and written materials on patient's individual exercise prescription       Expected Outcomes Short Term: Able to explain program exercise prescription;Long Term: Able to explain home exercise prescription to exercise independently                Exercise Goals Re-Evaluation :  Discharge Exercise Prescription (Final Exercise Prescription Changes):  Exercise Prescription Changes - 06/09/23 1400       Response to Exercise   Blood Pressure (Admit) 102/64    Blood Pressure (Exercise) 134/62    Blood Pressure (Exit) 120/66    Heart Rate (Admit) 91 bpm    Heart Rate (Exercise) 108 bpm    Heart Rate (Exit) 90 bpm    Oxygen Saturation (Admit) 96 %    Oxygen Saturation (Exercise) 99 %    Rating of Perceived Exertion (Exercise) 11    Perceived Dyspnea (Exercise) 1    Symptoms chest "cloudy", SOB    Comments walk test restuls             Nutrition:  Target Goals: Understanding of nutrition guidelines, daily intake of sodium 1500mg , cholesterol 200mg , calories 30% from fat  and 7% or less from saturated fats, daily to have 5 or more servings of fruits and vegetables.  Biometrics:  Pre Biometrics - 06/09/23 1441       Pre Biometrics   Height 5' 6.5" (1.689 m)    Weight 146 lb 6.4 oz (66.4 kg)    Waist Circumference 32 inches    Hip Circumference 33.5 inches    Waist to Hip Ratio 0.96 %    BMI (Calculated) 23.28    Grip Strength 34.5 kg    Single Leg Stand 16.5 seconds              Nutrition Therapy Plan and Nutrition Goals:   Nutrition Assessments:  MEDIFICTS Score Key: >=70 Need to make dietary changes  40-70 Heart Healthy Diet <= 40 Therapeutic Level Cholesterol Diet  Flowsheet Row CARDIAC VIRTUAL BASED CARE from 06/08/2023 in [facility] CARDIAC REHABILITATION  Picture Your Plate Total Score on Admission 39      Picture Your Plate Scores: <47 Unhealthy dietary pattern with much room for improvement. 41-50 Dietary pattern unlikely to meet recommendations for good health and room for improvement. 51-60 More healthful dietary pattern, with some room for improvement.  >60 Healthy dietary pattern, although there may be some specific behaviors that could be improved.    Nutrition Goals Re-Evaluation:   Nutrition Goals Discharge (Final Nutrition Goals Re-Evaluation):   Psychosocial: Target Goals: Acknowledge presence or absence of significant depression and/or stress, maximize coping skills, provide positive support system. Participant is able to verbalize types and ability to use techniques and skills needed for reducing stress and depression.  Initial Review & Psychosocial Screening:  Initial Psych Review & Screening - 06/08/23 1343       Initial Review   Current issues with None Identified      Family Dynamics   Good Support System? Yes      Barriers   Psychosocial barriers to participate in program There are no identifiable barriers or psychosocial needs.;The patient should benefit from training in stress management and  relaxation.      Screening Interventions   Interventions To provide support and resources with identified psychosocial needs;Encouraged to exercise;Provide feedback about the scores to participant    Expected Outcomes Short Term goal: Utilizing psychosocial counselor, staff and physician to assist with identification of specific Stressors or current issues interfering with healing process. Setting desired goal for each stressor or current issue identified.;Long Term Goal: Stressors or current issues are controlled or eliminated.;Short Term goal: Identification and review with participant of any Quality of Life or Depression concerns found by scoring the questionnaire.;Long Term goal: The participant improves quality of Life and  PHQ9 Scores as seen by post scores and/or verbalization of changes             Quality of Life Scores:  Quality of Life - 06/08/23 1342       Quality of Life   Select Quality of Life      Quality of Life Scores   Health/Function Pre 23.67 %    Socioeconomic Pre 22.81 %    Psych/Spiritual Pre 26.93 %    Family Pre 30 %    GLOBAL Pre 25.03 %            Scores of 19 and below usually indicate a poorer quality of life in these areas.  A difference of  2-3 points is a clinically meaningful difference.  A difference of 2-3 points in the total score of the Quality of Life Index has been associated with significant improvement in overall quality of life, self-image, physical symptoms, and general health in studies assessing change in quality of life.  PHQ-9: Review Flowsheet        No data to display         Interpretation of Total Score  Total Score Depression Severity:  1-4 = Minimal depression, 5-9 = Mild depression, 10-14 = Moderate depression, 15-19 = Moderately severe depression, 20-27 = Severe depression   Psychosocial Evaluation and Intervention:  Psychosocial Evaluation - 06/08/23 1344       Psychosocial Evaluation & Interventions    Interventions Stress management education;Relaxation education;Encouraged to exercise with the program and follow exercise prescription    Comments Patient was referred to CR with STEMI/Stent placement 05/27/23. He has been having some issues with hypotension since his discharge with some light-headedness and general malaise. His losartan was discontinued with improved symptoms and blood pressure. 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 He has not returned to work but plans to return once released from his cardiologist. He lives with his wife who is his main support person. His son and daughter-in-law also support him. He denies any depression, anxiety or major stressors in his life. He says he sleeps well most of the time. He usually gets 6 hours/night which he says is good for him. He is motivated to start the program to improve his health overall. He also wants to improve his stamina; feel better overall and get back to normal. He also enjoys hiking and would like to be able to do this again. He has no barriers identified to participate in the program.    Expected Outcomes Short Term: Start the program and attend consistently. Long Term: Meet his personal goals.    Continue Psychosocial Services  Follow up required by staff             Psychosocial Re-Evaluation:   Psychosocial Discharge (Final Psychosocial Re-Evaluation):   Vocational Rehabilitation: Provide vocational rehab assistance to qualifying candidates.   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             Education: Education Goals: Education classes will be provided on a weekly basis, covering  required topics. Participant will state understanding/return demonstration of topics presented.  Learning Barriers/Preferences:  Learning Barriers/Preferences - 06/08/23 1339  Learning Barriers/Preferences   Learning Barriers None    Learning Preferences Audio;Skilled Demonstration             Education Topics: Hypertension, Hypertension Reduction -Define heart disease and high blood pressure. Discus how high blood pressure affects the body and ways to reduce high blood pressure.   Exercise and Your Heart -Discuss why it is important to exercise, the FITT principles of exercise, normal and abnormal responses to exercise, and how to exercise safely.   Angina -Discuss definition of angina, causes of angina, treatment of angina, and how to decrease risk of having angina.   Cardiac Medications -Review what the following cardiac medications are used for, how they affect the body, and side effects that may occur when taking the medications.  Medications include Aspirin, Beta blockers, calcium channel blockers, ACE Inhibitors, angiotensin receptor blockers, diuretics, digoxin, and antihyperlipidemics.   Congestive Heart Failure -Discuss the definition of CHF, how to live with CHF, the signs and symptoms of CHF, and how keep track of weight and sodium intake.   Heart Disease and Intimacy -Discus the effect sexual activity has on the heart, how changes occur during intimacy as we age, and safety during sexual activity.   Smoking Cessation / COPD -Discuss different methods to quit smoking, the health benefits of quitting smoking, and the definition of COPD.   Nutrition I: Fats -Discuss the types of cholesterol, what cholesterol does to the heart, and how cholesterol levels can be controlled.   Nutrition II: Labels -Discuss the different components of food labels and how to read food label   Heart Parts/Heart Disease and PAD -Discuss the anatomy of the heart, the  pathway of blood circulation through the heart, and these are affected by heart disease.   Stress I: Signs and Symptoms -Discuss the causes of stress, how stress may lead to anxiety and depression, and ways to limit stress.   Stress II: Relaxation -Discuss different types of relaxation techniques to limit stress.   Warning Signs of Stroke / TIA -Discuss definition of a stroke, what the signs and symptoms are of a stroke, and how to identify when someone is having stroke.   Knowledge Questionnaire Score:  Knowledge Questionnaire Score - 06/08/23 1342       Knowledge Questionnaire Score   Pre Score 22/26             Core Components/Risk Factors/Patient Goals at Admission:  Personal Goals and Risk Factors at Admission - 06/09/23 1443       Core Components/Risk Factors/Patient Goals on Admission    Weight Management Weight Maintenance;Yes    Intervention Weight Management: Develop a combined nutrition and exercise program designed to reach desired caloric intake, while maintaining appropriate intake of nutrient and fiber, sodium and fats, and appropriate energy expenditure required for the weight goal.;Weight Management: Provide education and appropriate resources to help participant work on and attain dietary goals.    Admit Weight 146 lb 6.4 oz (66.4 kg)    Goal Weight: Short Term 146 lb (66.2 kg)    Goal Weight: Long Term 146 lb (66.2 kg)    Expected Outcomes Short Term: Continue to assess and modify interventions until short term weight is achieved;Long Term: Adherence to nutrition and physical activity/exercise program aimed toward attainment of established weight goal;Weight Maintenance: Understanding of the daily nutrition guidelines, which includes 25-35% calories from fat, 7% or less cal from saturated fats, less than 200mg  cholesterol, less than 1.5gm of sodium, & 5 or more servings of  fruits and vegetables daily    Improve shortness of breath with ADL's Yes     Intervention Provide education, individualized exercise plan and daily activity instruction to help decrease symptoms of SOB with activities of daily living.    Expected Outcomes Short Term: Improve cardiorespiratory fitness to achieve a reduction of symptoms when performing ADLs;Long Term: Be able to perform more ADLs without symptoms or delay the onset of symptoms    Lipids Yes    Intervention Provide education and support for participant on nutrition & aerobic/resistive exercise along with prescribed medications to achieve LDL 70mg , HDL >40mg .    Expected Outcomes Short Term: Participant states understanding of desired cholesterol values and is compliant with medications prescribed. Participant is following exercise prescription and nutrition guidelines.;Long Term: Cholesterol controlled with medications as prescribed, with individualized exercise RX and with personalized nutrition plan. Value goals: LDL < 70mg , HDL > 40 mg.             Core Components/Risk Factors/Patient Goals Review:    Core Components/Risk Factors/Patient Goals at Discharge (Final Review):    ITP Comments:  ITP Comments     Row Name 06/09/23 1419 06/29/23 0826         ITP Comments Patient attend orientation today.  Patient is attendingCardiac Rehabilitation Program.  Documentation for diagnosis can be found in [facility] 05/27/23.  Reviewed medical chart, RPE/RPD, gym safety, and program guidelines.  Patient was fitted to equipment they will be using during rehab.  Patient is scheduled to start exercise on Tuesday 06/14/23 at 1500.   Initial ITP created and sent for review and signature by Dr. Dina Rich, Medical Director for Cardiac Rehabilitation Program. 30 day review completed. ITP sent to Dr. Dina Rich, Medical Director of Cardiac Rehab. Continue with ITP unless changes are made by physician.  Program start was help for chest pain by MD.               Comments: 30 day review

## 2023-06-30 ENCOUNTER — Encounter (HOSPITAL_COMMUNITY): Payer: 59

## 2023-07-05 ENCOUNTER — Encounter (HOSPITAL_COMMUNITY): Payer: 59

## 2023-07-05 DIAGNOSIS — R221 Localized swelling, mass and lump, neck: Secondary | ICD-10-CM | POA: Diagnosis not present

## 2023-07-07 ENCOUNTER — Encounter (HOSPITAL_COMMUNITY): Payer: 59

## 2023-07-11 ENCOUNTER — Encounter (HOSPITAL_COMMUNITY)
Admission: RE | Admit: 2023-07-11 | Discharge: 2023-07-11 | Disposition: A | Discharge status: Home / Self Care | Payer: 59 | Source: Ambulatory Visit | Attending: Cardiovascular Disease | Admitting: Cardiovascular Disease

## 2023-07-11 DIAGNOSIS — Z955 Presence of coronary angioplasty implant and graft: Secondary | ICD-10-CM | POA: Insufficient documentation

## 2023-07-11 DIAGNOSIS — I213 ST elevation (STEMI) myocardial infarction of unspecified site: Secondary | ICD-10-CM | POA: Diagnosis present

## 2023-07-11 NOTE — Progress Notes (Signed)
Daily Session Note  Patient Details  Name: Juan Schultz MRN: [id number] Date of Birth: September 12, 1955 Referring Provider:   Flowsheet Row CARDIAC REHAB PHASE II ORIENTATION from 06/09/2023 in [facility] CARDIAC REHABILITATION  Referring Provider Yates Decamp MD  [facility] Cardiologist: Dr. Cristal Deer McAlhany]       Encounter Date: 07/11/2023  Check In:  Session Check In - 07/11/23 0752       Check-In   Supervising physician immediately available to respond to emergencies See telemetry face sheet for immediately available MD    Location [facility]-Cardiac & Pulmonary Rehab    Staff Present Fabio Pierce, MA, RCEP, CCRP, CCET;Dorrian Doggett Fredric Mare, BS, Exercise Physiologist    Virtual Visit No    Medication changes reported     Yes    Comments updated meds at last office vist. Off BP meds   added Ronexa    Fall or balance concerns reported    No    Tobacco Cessation No Change    Warm-up and Cool-down Performed on first and last piece of equipment    Resistance Training Performed Yes    VAD Patient? No    PAD/SET Patient? No      Pain Assessment   Currently in Pain? No/denies    Pain Score 0-No pain    Multiple Pain Sites No             Capillary Blood Glucose: No results found for this or any previous visit (from the past 24 hour(s)).    Social History   Tobacco Use  Smoking Status Never  Smokeless Tobacco Never    Goals Met:  Independence with exercise equipment Exercise tolerated well No report of concerns or symptoms today Strength training completed today  Goals Unmet:  Not Applicable  Comments: First full day of exercise!  Patient was oriented to gym and equipment including functions, settings, policies, and procedures.  Patient's individual exercise prescription and treatment plan were reviewed.  All starting workloads were established based on the results of the 6 minute walk test done at initial orientation visit.  The plan for exercise progression was also  introduced and progression will be customized based on patient's performance and goals.

## 2023-07-12 ENCOUNTER — Encounter (HOSPITAL_COMMUNITY): Payer: 59

## 2023-07-13 ENCOUNTER — Encounter (HOSPITAL_COMMUNITY)
Admission: RE | Admit: 2023-07-13 | Discharge: 2023-07-13 | Disposition: A | Discharge status: Home / Self Care | Payer: 59 | Source: Ambulatory Visit | Attending: Cardiovascular Disease | Admitting: Cardiovascular Disease

## 2023-07-13 DIAGNOSIS — Z955 Presence of coronary angioplasty implant and graft: Secondary | ICD-10-CM

## 2023-07-13 DIAGNOSIS — I213 ST elevation (STEMI) myocardial infarction of unspecified site: Secondary | ICD-10-CM

## 2023-07-13 NOTE — Progress Notes (Signed)
Daily Session Note  Patient Details  Name: Juan Schultz MRN: [id number] Date of Birth: Jul 19, 1956 Referring Provider:   Flowsheet Row CARDIAC REHAB PHASE II ORIENTATION from 06/09/2023 in [facility] CARDIAC REHABILITATION  Referring Provider Yates Decamp MD  [facility] Cardiologist: Dr. Cristal Deer McAlhany]       Encounter Date: 07/13/2023  Check In:  Session Check In - 07/13/23 0745       Check-In   Supervising physician immediately available to respond to emergencies See telemetry face sheet for immediately available MD    Location [facility]    Staff Present Ross Ludwig, BS, Exercise Physiologist;Other    Virtual Visit No    Medication changes reported     No    Fall or balance concerns reported    No    Tobacco Cessation No Change    Warm-up and Cool-down Performed on first and last piece of equipment    Resistance Training Performed Yes    VAD Patient? No    PAD/SET Patient? No      Pain Assessment   Currently in Pain? No/denies    Pain Score 0-No pain    Multiple Pain Sites No             Capillary Blood Glucose: No results found for this or any previous visit (from the past 24 hour(s)).    Social History   Tobacco Use  Smoking Status Never  Smokeless Tobacco Never    Goals Met:  Independence with exercise equipment Personal goals reviewed No report of concerns or symptoms today Strength training completed today  Goals Unmet:  Not Applicable  Comments: Pt able to follow exercise prescription today without complaint.  Will continue to monitor for progression.

## 2023-07-14 ENCOUNTER — Telehealth: Payer: Self-pay | Admitting: Cardiovascular Disease

## 2023-07-14 ENCOUNTER — Encounter (HOSPITAL_COMMUNITY): Payer: 59

## 2023-07-14 NOTE — Telephone Encounter (Signed)
Pt dropped off [employer] form, needs to specify that he is ok to work at least 5 hrs a day.  Please fill out and fax to [phone]. Form will be in providers box by eod.

## 2023-07-18 ENCOUNTER — Encounter (HOSPITAL_COMMUNITY)
Admission: RE | Admit: 2023-07-18 | Discharge: 2023-07-18 | Disposition: A | Discharge status: Home / Self Care | Payer: 59 | Source: Ambulatory Visit | Attending: Cardiovascular Disease | Admitting: Cardiovascular Disease

## 2023-07-18 DIAGNOSIS — I213 ST elevation (STEMI) myocardial infarction of unspecified site: Secondary | ICD-10-CM

## 2023-07-18 DIAGNOSIS — Z955 Presence of coronary angioplasty implant and graft: Secondary | ICD-10-CM | POA: Diagnosis not present

## 2023-07-18 NOTE — Telephone Encounter (Signed)
Pt following up on phone note from 11/21. Please advise

## 2023-07-18 NOTE — Telephone Encounter (Signed)
Called patient and advised that [name] will be in the office on Wed and we can complete form at that time and he has requested the completed form be faxed to number on the form.

## 2023-07-18 NOTE — Progress Notes (Addendum)
Daily Session Note  Patient Details  Name: Juan Schultz MRN: [id number] Date of Birth: 11-Aug-1956 Referring Provider:   Flowsheet Row CARDIAC REHAB PHASE II ORIENTATION from 06/09/2023 in [facility] CARDIAC REHABILITATION  Referring Provider Yates Decamp MD  [facility] Cardiologist: Dr. Cristal Deer McAlhany]       Encounter Date: 07/18/2023  Check In:  Session Check In - 07/18/23 0757       Check-In   Supervising physician immediately available to respond to emergencies See telemetry face sheet for immediately available MD    Location [facility]    Staff Present Ross Ludwig, BS, Exercise Physiologist;Debra Laural Benes, RN, Thomos Lemons, MA, RCEP, CCRP, CCET    Virtual Visit No    Medication changes reported     No    Fall or balance concerns reported    No    Warm-up and Cool-down Performed on first and last piece of equipment    Resistance Training Performed Yes    VAD Patient? No    PAD/SET Patient? No      Pain Assessment   Currently in Pain? No/denies             Capillary Blood Glucose: No results found for this or any previous visit (from the past 24 hour(s)).    Social History   Tobacco Use  Smoking Status Never  Smokeless Tobacco Never    Goals Met:  Independence with exercise equipment Exercise tolerated well No report of concerns or symptoms today Strength training completed today  Goals Unmet:  Not Applicable  Comments: Pt able to follow exercise prescription today without complaint.  Will continue to monitor for progression.  Reviewed home exercise with pt today.  Pt plans to walk and use bike and staff videos at home for exercise.  Reviewed THR, pulse, RPE, sign and symptoms, pulse oximetery and when to call 911 or MD.  Also discussed weather considerations and indoor options.  Pt voiced understanding.

## 2023-07-19 ENCOUNTER — Encounter (HOSPITAL_COMMUNITY): Payer: 59

## 2023-07-20 ENCOUNTER — Encounter (HOSPITAL_COMMUNITY): Payer: 59

## 2023-07-20 NOTE — Telephone Encounter (Signed)
Left message for patient to call back to let us know what day he returned to work so that form can be completed.

## 2023-07-25 ENCOUNTER — Encounter (HOSPITAL_COMMUNITY): Payer: 59

## 2023-07-26 ENCOUNTER — Encounter (HOSPITAL_COMMUNITY): Payer: PRIVATE HEALTH INSURANCE

## 2023-07-26 NOTE — Telephone Encounter (Signed)
Updated return to work form faxed as directed to [phone].  Copy placed to be scanned into chart.

## 2023-07-27 ENCOUNTER — Encounter (HOSPITAL_COMMUNITY): Payer: Self-pay | Admitting: *Deleted

## 2023-07-27 ENCOUNTER — Encounter (HOSPITAL_COMMUNITY): Payer: 59

## 2023-07-27 DIAGNOSIS — I213 ST elevation (STEMI) myocardial infarction of unspecified site: Secondary | ICD-10-CM

## 2023-07-27 DIAGNOSIS — Z955 Presence of coronary angioplasty implant and graft: Secondary | ICD-10-CM

## 2023-07-27 NOTE — Progress Notes (Signed)
Cardiac Individual Treatment Plan  Patient Details  Name: Juan Schultz MRN: [id number] Date of Birth: 10-15-1955 Referring Provider:   Flowsheet Row CARDIAC REHAB PHASE II ORIENTATION from 06/09/2023 in [facility] CARDIAC REHABILITATION  Referring Provider Yates Decamp MD  [Primary Cardiologist: Dr. Cristal Deer McAlhany]       Initial Encounter Date:  Flowsheet Row CARDIAC REHAB PHASE II ORIENTATION from 06/09/2023 in [place] CARDIAC REHABILITATION  Date 06/09/23       Visit Diagnosis: ST elevation myocardial infarction (STEMI), unspecified artery [facility])  Status post coronary artery stent placement  Patient's Home Medications on Admission:  Current Outpatient Medications:    albuterol (VENTOLIN HFA) 108 (90 Base) MCG/ACT inhaler, Inhale 2 puffs into the lungs as needed for wheezing or shortness of breath., Disp: , Rfl:    aspirin EC 81 MG tablet, Take 1 tablet (81 mg total) by mouth daily. Swallow whole., Disp: 90 tablet, Rfl: 3   cetirizine (ZYRTEC) 10 MG tablet, Take 10 mg by mouth daily as needed for allergies., Disp: , Rfl:    Cholecalciferol (D3 PO), Take 1 tablet by mouth daily., Disp: , Rfl:    icosapent Ethyl (VASCEPA) 1 g capsule, Take 2 capsules (2 g total) by mouth 2 (two) times daily., Disp: 120 capsule, Rfl: 6   nitroGLYCERIN (NITROSTAT) 0.4 MG SL tablet, Place 1 tablet (0.4 mg total) under the tongue every 5 (five) minutes as needed for chest pain., Disp: 25 tablet, Rfl: 3   Omega-3 Fatty Acids (OMEGA-3 FISH OIL) 500 MG CAPS, Take 500 mg by mouth daily. Take 2 daily 1000 mg/day, Disp: , Rfl:    prasugrel (EFFIENT) 10 MG TABS tablet, Take 1 tablet (10 mg total) by mouth daily., Disp: 90 tablet, Rfl: 3   ranolazine (RANEXA) 500 MG 12 hr tablet, Take 1 tablet (500 mg total) by mouth 2 (two) times daily., Disp: 60 tablet, Rfl: 5   rosuvastatin (CRESTOR) 20 MG tablet, Take 1 tablet (20 mg total) by mouth daily., Disp: 90 tablet, Rfl: 3  Past Medical History: Past  Medical History:  Diagnosis Date   Seasonal allergies     Tobacco Use: Social History   Tobacco Use  Smoking Status Never  Smokeless Tobacco Never    Labs: Review Flowsheet       Latest Ref Rng & Units 05/27/2023  Labs for ITP Cardiac and Pulmonary Rehab  Cholestrol 0 - 200 mg/dL 409   LDL (calc) 0 - 99 mg/dL 811   HDL-C >91 mg/dL 58   Trlycerides <478 mg/dL 295   Hemoglobin A2Z 4.8 - 5.6 % 5.8     Details            Capillary Blood Glucose: Lab Results  Component Value Date   GLUCAP 150 (H) 05/27/2023     Exercise Target Goals: Exercise Program Goal: Individual exercise prescription set using results from initial 6 min walk test and THRR while considering  patient's activity barriers and safety.   Exercise Prescription Goal: Starting with aerobic activity 30 plus minutes a day, 3 days per week for initial exercise prescription. Provide home exercise prescription and guidelines that participant acknowledges understanding prior to discharge.  Activity Barriers & Risk Stratification:  Activity Barriers & Cardiac Risk Stratification - 06/09/23 1436       Activity Barriers & Cardiac Risk Stratification   Activity Barriers Arthritis;Shortness of Breath;Chest Pain/Angina;Joint Problems   R knee is bone on bone   Cardiac Risk Stratification Moderate  6 Minute Walk:  6 Minute Walk     Row Name 06/09/23 1434         6 Minute Walk   Phase Initial     Distance 1440 feet     Walk Time 6 minutes     # of Rest Breaks 0     MPH 2.72     METS 3.75     RPE 11     Perceived Dyspnea  1     VO2 Peak 13.13     Symptoms Yes (comment)     Comments chest "cloudy" 4/10, SOB     Resting HR 91 bpm     Resting BP 102/64     Resting Oxygen Saturation  96 %     Exercise Oxygen Saturation  during 6 min walk 99 %     Max Ex. HR 108 bpm     Max Ex. BP 134/62     2 Minute Post BP 120/66              Oxygen Initial Assessment:   Oxygen  Re-Evaluation:   Oxygen Discharge (Final Oxygen Re-Evaluation):   Initial Exercise Prescription:  Initial Exercise Prescription - 06/09/23 1400       Date of Initial Exercise RX and Referring Provider   Date 06/09/23    Referring Provider Yates Decamp MD   Primary Cardiologist: Dr. Verne Carrow     Oxygen   Maintain Oxygen Saturation 88% or higher      Treadmill   MPH 2.7    Grade 0.5    Minutes 15    METs 3.25      REL-XR   Level 3    Speed 50    Minutes 15    METs 3      Prescription Details   Frequency (times per week) 2    Duration Progress to 30 minutes of continuous aerobic without signs/symptoms of physical distress      Intensity   THRR 40-80% of Max Heartrate 117-142    Ratings of Perceived Exertion 11-13    Perceived Dyspnea 0-4      Progression   Progression Continue to progress workloads to maintain intensity without signs/symptoms of physical distress.      Resistance Training   Training Prescription Yes    Weight 3 lb    Reps 10-15             Perform Capillary Blood Glucose checks as needed.  Exercise Prescription Changes:   Exercise Prescription Changes     Row Name 06/09/23 1400 07/11/23 1400           Response to Exercise   Blood Pressure (Admit) 102/64 122/64      Blood Pressure (Exercise) 134/62 124/62      Blood Pressure (Exit) 120/66 126/72      Heart Rate (Admit) 91 bpm 2.03 bpm      Heart Rate (Exercise) 108 bpm 92 bpm      Heart Rate (Exit) 90 bpm 119 bpm      Oxygen Saturation (Admit) 96 % 97 %      Oxygen Saturation (Exercise) 99 % --      Rating of Perceived Exertion (Exercise) 11 13      Perceived Dyspnea (Exercise) 1 --      Symptoms chest "cloudy", SOB --      Comments walk test restuls --      Duration -- Continue with 30 min of aerobic exercise without  signs/symptoms of physical distress.      Intensity -- THRR unchanged        Progression   Progression -- Continue to progress workloads to maintain  intensity without signs/symptoms of physical distress.        Resistance Training   Training Prescription -- Yes      Weight -- 4      Reps -- 10-15        Treadmill   MPH -- 1.8      Grade -- 0.5      Minutes -- 15      METs -- 2.5        REL-XR   Level -- 3      Speed -- 54      Minutes -- 15      METs -- 4        Oxygen   Maintain Oxygen Saturation -- 88% or higher               Exercise Comments:   Exercise Comments     Row Name 07/11/23 0756           Exercise Comments First full day of exercise!  Patient was oriented to gym and equipment including functions, settings, policies, and procedures.  Patient's individual exercise prescription and treatment plan were reviewed.  All starting workloads were established based on the results of the 6 minute walk test done at initial orientation visit.  The plan for exercise progression was also introduced and progression will be customized based on patient's performance and goals.                Exercise Goals and Review:   Exercise Goals     Row Name 06/09/23 1441             Exercise Goals   Increase Physical Activity Yes       Intervention Provide advice, education, support and counseling about physical activity/exercise needs.;Develop an individualized exercise prescription for aerobic and resistive training based on initial evaluation findings, risk stratification, comorbidities and participant's personal goals.       Expected Outcomes Short Term: Attend rehab on a regular basis to increase amount of physical activity.;Long Term: Add in home exercise to make exercise part of routine and to increase amount of physical activity.;Long Term: Exercising regularly at least 3-5 days a week.       Increase Strength and Stamina Yes       Intervention Provide advice, education, support and counseling about physical activity/exercise needs.;Develop an individualized exercise prescription for aerobic and resistive  training based on initial evaluation findings, risk stratification, comorbidities and participant's personal goals.       Expected Outcomes Short Term: Increase workloads from initial exercise prescription for resistance, speed, and METs.;Short Term: Perform resistance training exercises routinely during rehab and add in resistance training at home;Long Term: Improve cardiorespiratory fitness, muscular endurance and strength as measured by increased METs and functional capacity ( )       Able to understand and use rate of perceived exertion (RPE) scale Yes       Intervention Provide education and explanation on how to use RPE scale       Expected Outcomes Short Term: Able to use RPE daily in rehab to express subjective intensity level;Long Term:  Able to use RPE to guide intensity level when exercising independently       Able to understand and use Dyspnea scale Yes  Intervention Provide education and explanation on how to use Dyspnea scale       Expected Outcomes Short Term: Able to use Dyspnea scale daily in rehab to express subjective sense of shortness of breath during exertion;Long Term: Able to use Dyspnea scale to guide intensity level when exercising independently       Knowledge and understanding of Target Heart Rate Range (THRR) Yes       Intervention Provide education and explanation of THRR including how the numbers were predicted and where they are located for reference       Expected Outcomes Short Term: Able to state/look up THRR;Long Term: Able to use THRR to govern intensity when exercising independently;Short Term: Able to use daily as guideline for intensity in rehab       Able to check pulse independently Yes       Intervention Provide education and demonstration on how to check pulse in carotid and radial arteries.;Review the importance of being able to check your own pulse for safety during independent exercise       Expected Outcomes Short Term: Able to explain why pulse  checking is important during independent exercise;Long Term: Able to check pulse independently and accurately       Understanding of Exercise Prescription Yes       Intervention Provide education, explanation, and written materials on patient's individual exercise prescription       Expected Outcomes Short Term: Able to explain program exercise prescription;Long Term: Able to explain home exercise prescription to exercise independently                Exercise Goals Re-Evaluation :  Exercise Goals Re-Evaluation     Row Name 07/11/23 0756 07/11/23 1431 07/18/23 0805         Exercise Goal Re-Evaluation   Exercise Goals Review Able to understand and use rate of perceived exertion (RPE) scale;Able to understand and use Dyspnea scale;Knowledge and understanding of Target Heart Rate Range (THRR);Understanding of Exercise Prescription Increase Physical Activity;Increase Strength and Stamina;Understanding of Exercise Prescription Increase Physical Activity;Increase Strength and Stamina;Able to understand and use rate of perceived exertion (RPE) scale;Able to understand and use Dyspnea scale;Knowledge and understanding of Target Heart Rate Range (THRR);Able to check pulse independently;Understanding of Exercise Prescription     Comments Reviewed RPE and dyspnea scale, THR and program prescription with pt today.  Pt voiced understanding and was given a copy of goals to take home. Juan Schultz has his first day of exercise. He did good with exercise, he needs to get use to the treadmill before increasing. He ids at level 3 on the XR with a MEt of 4.0. Will continue to monitor and progreess as able. Juan Schultz is off to a good start in rehab.  He noted he was very tired and washed out after church yesterday.  Reviewed home exercise with pt today.  Pt plans to walk and use bike and staff videos at home for exercise.  Reviewed THR, pulse, RPE, sign and symptoms, pulse oximetery and when to call 911 or MD.  Also discussed  weather considerations and indoor options.  Pt voiced understanding.     Expected Outcomes Short: Use RPE daily to regulate intensity.  Long: Follow program prescription in THR. Short: continue to get use to the treadmill   long termL continue to attend rehab Short: Continue to add in exercise at home Long: Continue to build up stamina.  Discharge Exercise Prescription (Final Exercise Prescription Changes):  Exercise Prescription Changes - 07/11/23 1400       Response to Exercise   Blood Pressure (Admit) 122/64    Blood Pressure (Exercise) 124/62    Blood Pressure (Exit) 126/72    Heart Rate (Admit) 2.03 bpm    Heart Rate (Exercise) 92 bpm    Heart Rate (Exit) 119 bpm    Oxygen Saturation (Admit) 97 %    Rating of Perceived Exertion (Exercise) 13    Duration Continue with 30 min of aerobic exercise without signs/symptoms of physical distress.    Intensity THRR unchanged      Progression   Progression Continue to progress workloads to maintain intensity without signs/symptoms of physical distress.      Resistance Training   Training Prescription Yes    Weight 4    Reps 10-15      Treadmill   MPH 1.8    Grade 0.5    Minutes 15    METs 2.5      REL-XR   Level 3    Speed 54    Minutes 15    METs 4      Oxygen   Maintain Oxygen Saturation 88% or higher             Nutrition:  Target Goals: Understanding of nutrition guidelines, daily intake of sodium 1500mg , cholesterol 200mg , calories 30% from fat and 7% or less from saturated fats, daily to have 5 or more servings of fruits and vegetables.  Biometrics:  Pre Biometrics - 06/09/23 1441       Pre Biometrics   Height 5' 6.5" (1.689 m)    Weight 146 lb 6.4 oz (66.4 kg)    Waist Circumference 32 inches    Hip Circumference 33.5 inches    Waist to Hip Ratio 0.96 %    BMI (Calculated) 23.28    Grip Strength 34.5 kg    Single Leg Stand 16.5 seconds              Nutrition Therapy Plan  and Nutrition Goals:   Nutrition Assessments:  MEDIFICTS Score Key: >=70 Need to make dietary changes  40-70 Heart Healthy Diet <= 40 Therapeutic Level Cholesterol Diet  Flowsheet Row CARDIAC VIRTUAL BASED CARE from 06/08/2023 in [facility] CARDIAC REHABILITATION  Picture Your Plate Total Score on Admission 39      Picture Your Plate Scores: <62 Unhealthy dietary pattern with much room for improvement. 41-50 Dietary pattern unlikely to meet recommendations for good health and room for improvement. 51-60 More healthful dietary pattern, with some room for improvement.  >60 Healthy dietary pattern, although there may be some specific behaviors that could be improved.    Nutrition Goals Re-Evaluation:  Nutrition Goals Re-Evaluation     Row Name 07/18/23 0812             Goals   Nutrition Goal Heart Healthy Diet       Comment Juan Schultz is doing well in rehab.  He has been following heart healthy diet since January.  He has been watching his sugar.  He has stayed away from desserts.  He tries to avoid processed foods and focuses on low carb diet.  He has been trying to get a enough protein.  He has been eating canned fish to get the omega 3s.  He has been trying to stick to vegetable oils versus seed oils.       Expected Outcome Short: Check salt content  in canned fish Long: Continue to watch fat and sugar                Nutrition Goals Discharge (Final Nutrition Goals Re-Evaluation):  Nutrition Goals Re-Evaluation - 07/18/23 0812       Goals   Nutrition Goal Heart Healthy Diet    Comment Juan Schultz is doing well in rehab.  He has been following heart healthy diet since January.  He has been watching his sugar.  He has stayed away from desserts.  He tries to avoid processed foods and focuses on low carb diet.  He has been trying to get a enough protein.  He has been eating canned fish to get the omega 3s.  He has been trying to stick to vegetable oils versus seed oils.    Expected  Outcome Short: Check salt content in canned fish Long: Continue to watch fat and sugar             Psychosocial: Target Goals: Acknowledge presence or absence of significant depression and/or stress, maximize coping skills, provide positive support system. Participant is able to verbalize types and ability to use techniques and skills needed for reducing stress and depression.  Initial Review & Psychosocial Screening:  Initial Psych Review & Screening - 06/08/23 1343       Initial Review   Current issues with None Identified      Family Dynamics   Good Support System? Yes      Barriers   Psychosocial barriers to participate in program There are no identifiable barriers or psychosocial needs.;The patient should benefit from training in stress management and relaxation.      Screening Interventions   Interventions To provide support and resources with identified psychosocial needs;Encouraged to exercise;Provide feedback about the scores to participant    Expected Outcomes Short Term goal: Utilizing psychosocial counselor, staff and physician to assist with identification of specific Stressors or current issues interfering with healing process. Setting desired goal for each stressor or current issue identified.;Long Term Goal: Stressors or current issues are controlled or eliminated.;Short Term goal: Identification and review with participant of any Quality of Life or Depression concerns found by scoring the questionnaire.;Long Term goal: The participant improves quality of Life and PHQ9 Scores as seen by post scores and/or verbalization of changes             Quality of Life Scores:  Quality of Life - 06/08/23 1342       Quality of Life   Select Quality of Life      Quality of Life Scores   Health/Function Pre 23.67 %    Socioeconomic Pre 22.81 %    Psych/Spiritual Pre 26.93 %    Family Pre 30 %    GLOBAL Pre 25.03 %            Scores of 19 and below usually  indicate a poorer quality of life in these areas.  A difference of  2-3 points is a clinically meaningful difference.  A difference of 2-3 points in the total score of the Quality of Life Index has been associated with significant improvement in overall quality of life, self-image, physical symptoms, and general health in studies assessing change in quality of life.  PHQ-9: Review Flowsheet       07/11/2023  Depression screen PHQ 2/9  Decreased Interest 0  Down, Depressed, Hopeless 0  PHQ - 2 Score 0  Altered sleeping 1  Tired, decreased energy 1  Change in appetite  0  Feeling bad or failure about yourself  0  Trouble concentrating 1  Moving slowly or fidgety/restless 0  Suicidal thoughts 0  PHQ-9 Score 3  Difficult doing work/chores Somewhat difficult    Details           Interpretation of Total Score  Total Score Depression Severity:  1-4 = Minimal depression, 5-9 = Mild depression, 10-14 = Moderate depression, 15-19 = Moderately severe depression, 20-27 = Severe depression   Psychosocial Evaluation and Intervention:  Psychosocial Evaluation - 06/08/23 1344       Psychosocial Evaluation & Interventions   Interventions Stress management education;Relaxation education;Encouraged to exercise with the program and follow exercise prescription    Comments Patient was referred to CR with STEMI/Stent placement 05/27/23. He has been having some issues with hypotension since his discharge with some light-headedness and general malaise. His losartan was discontinued with improved symptoms and blood pressure. 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 He has not returned to work but plans to return once released from his cardiologist. He lives with his wife who is his main support person. His son and daughter-in-law also support him. He denies any depression, anxiety or major stressors in his life. He says he sleeps well most of the  time. He usually gets 6 hours/night which he says is good for him. He is motivated to start the program to improve his health overall. He also wants to improve his stamina; feel better overall and get back to normal. He also enjoys hiking and would like to be able to do this again. He has no barriers identified to participate in the program.    Expected Outcomes Short Term: Start the program and attend consistently. Long Term: Meet his personal goals.    Continue Psychosocial Services  Follow up required by staff             Psychosocial Re-Evaluation:  Psychosocial Re-Evaluation     Row Name 07/18/23 0807             Psychosocial Re-Evaluation   Current issues with Current Stress Concerns       Comments Juan Schultz returned to work last week.  He was worn out after about 5 hours at work and went to see his doctor about it last week.  Yesterday after church he took a nap and didn't feel like going to parade in afternoon.  His attendees were happy to see him back at work.  He has been sleeping fairly well and feeling good in morning.  He did have a hard time getting to sleep last week.  He also just had his mom passed last week.  He has had a tough week overall.  He is grieving but not feeling depressed.       Expected Outcomes Short: Continue to cope with losing his mom Long: Continue to adjust returning to work       Interventions Encouraged to attend Cardiac Rehabilitation for the exercise       Continue Psychosocial Services  Follow up required by staff                Psychosocial Discharge (Final Psychosocial Re-Evaluation):  Psychosocial Re-Evaluation - 07/18/23 0807       Psychosocial Re-Evaluation   Current issues with Current Stress Concerns    Comments Juan Schultz returned to work last week.  He was worn out after about 5 hours at work and went to see his doctor about it  last week.  Yesterday after church he took a nap and didn't feel like going to parade in afternoon.  His attendees  were happy to see him back at work.  He has been sleeping fairly well and feeling good in morning.  He did have a hard time getting to sleep last week.  He also just had his mom passed last week.  He has had a tough week overall.  He is grieving but not feeling depressed.    Expected Outcomes Short: Continue to cope with losing his mom Long: Continue to adjust returning to work    Interventions Encouraged to attend Cardiac Rehabilitation for the exercise    Continue Psychosocial Services  Follow up required by staff             Vocational Rehabilitation: Provide vocational rehab assistance to qualifying candidates.   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             Education: Education Goals: Education classes will be provided on a weekly basis, covering required topics. Participant will state understanding/return demonstration of topics presented.  Learning Barriers/Preferences:  Learning Barriers/Preferences - 06/08/23 1339       Learning Barriers/Preferences   Learning Barriers None    Learning Preferences Audio;Skilled Demonstration             Education Topics: Hypertension, Hypertension Reduction -Define heart disease and high blood pressure. Discus how high blood pressure affects the body and ways to reduce high blood pressure.   Exercise and Your Heart -Discuss why it is important to exercise, the FITT principles of exercise, normal and abnormal responses to exercise, and how to exercise safely.   Angina -Discuss definition of angina, causes of angina, treatment of angina, and how to decrease risk of having angina.   Cardiac Medications -Review what the following cardiac medications  are used for, how they affect the body, and side effects that may occur when taking the medications.  Medications include Aspirin, Beta blockers, calcium channel blockers, ACE Inhibitors, angiotensin receptor blockers, diuretics, digoxin, and antihyperlipidemics.   Congestive Heart Failure -Discuss the definition of CHF, how to live with CHF, the signs and symptoms of CHF, and how keep track of weight and sodium intake.   Heart Disease and Intimacy -Discus the effect sexual activity has on the heart, how changes occur during intimacy as we age, and safety during sexual activity.   Smoking Cessation / COPD -Discuss different methods to quit smoking, the health benefits of quitting smoking, and the definition of COPD.   Nutrition I: Fats -Discuss the types of cholesterol, what cholesterol does to the heart, and how cholesterol levels can be controlled.   Nutrition II: Labels -Discuss the different components of food labels and how to read food label   Heart Parts/Heart Disease and PAD -Discuss the anatomy of the heart, the pathway of blood circulation through the heart, and these are affected by heart disease. Flowsheet Row CARDIAC REHAB PHASE II EXERCISE from 07/13/2023 in [place] CARDIAC REHABILITATION  Date 07/13/23  Educator hb  Instruction Review Code 1- Verbalizes Understanding       Stress I: Signs and Symptoms -Discuss the causes of stress, how stress may lead to  anxiety and depression, and ways to limit stress.   Stress II: Relaxation -Discuss different types of relaxation techniques to limit stress.   Warning Signs of Stroke / TIA -Discuss definition of a stroke, what the signs and symptoms are of a stroke, and how to identify when someone is having stroke.   Knowledge Questionnaire Score:  Knowledge Questionnaire Score - 06/08/23 1342       Knowledge Questionnaire Score   Pre Score 22/26             Core Components/Risk Factors/Patient Goals at  Admission:  Personal Goals and Risk Factors at Admission - 06/09/23 1443       Core Components/Risk Factors/Patient Goals on Admission    Weight Management Weight Maintenance;Yes    Intervention Weight Management: Develop a combined nutrition and exercise program designed to reach desired caloric intake, while maintaining appropriate intake of nutrient and fiber, sodium and fats, and appropriate energy expenditure required for the weight goal.;Weight Management: Provide education and appropriate resources to help participant work on and attain dietary goals.    Admit Weight 146 lb 6.4 oz (66.4 kg)    Goal Weight: Short Term 146 lb (66.2 kg)    Goal Weight: Long Term 146 lb (66.2 kg)    Expected Outcomes Short Term: Continue to assess and modify interventions until short term weight is achieved;Long Term: Adherence to nutrition and physical activity/exercise program aimed toward attainment of established weight goal;Weight Maintenance: Understanding of the daily nutrition guidelines, which includes 25-35% calories from fat, 7% or less cal from saturated fats, less than 200mg  cholesterol, less than 1.5gm of sodium, & 5 or more servings of fruits and vegetables daily    Improve shortness of breath with ADL's Yes    Intervention Provide education, individualized exercise plan and daily activity instruction to help decrease symptoms of SOB with activities of daily living.    Expected Outcomes Short Term: Improve cardiorespiratory fitness to achieve a reduction of symptoms when performing ADLs;Long Term: Be able to perform more ADLs without symptoms or delay the onset of symptoms    Lipids Yes    Intervention Provide education and support for participant on nutrition & aerobic/resistive exercise along with prescribed medications to achieve LDL 70mg , HDL >40mg .    Expected Outcomes Short Term: Participant states understanding of desired cholesterol values and is compliant with medications prescribed.  Participant is following exercise prescription and nutrition guidelines.;Long Term: Cholesterol controlled with medications as prescribed, with individualized exercise RX and with personalized nutrition plan. Value goals: LDL < 70mg , HDL > 40 mg.             Core Components/Risk Factors/Patient Goals Review:   Goals and Risk Factor Review     Row Name 07/18/23 0816             Core Components/Risk Factors/Patient Goals Review   Personal Goals Review Weight Management/Obesity;Lipids;Improve shortness of breath with ADL's       Review Juan Schultz is doing well in rehab.  His weight is holding steady around 142 lb.  He is watching his cholesterol levels and trying to get a lot of omega 3.  His pressures are doing well.  He had been doing well, but yesterday had the "band" feeling around his chest after church and was really tired too.  His breathing will come and go with the chest band feeling.  Overally, he was feeling better other than yesterday. He did have a hard week with returning to work and losing  his mom.       Expected Outcomes Short: Continue to try to pin point band feeling Long: Continue to improve breathing                Core Components/Risk Factors/Patient Goals at Discharge (Final Review):   Goals and Risk Factor Review - 07/18/23 0816       Core Components/Risk Factors/Patient Goals Review   Personal Goals Review Weight Management/Obesity;Lipids;Improve shortness of breath with ADL's    Review Juan Schultz is doing well in rehab.  His weight is holding steady around 142 lb.  He is watching his cholesterol levels and trying to get a lot of omega 3.  His pressures are doing well.  He had been doing well, but yesterday had the "band" feeling around his chest after church and was really tired too.  His breathing will come and go with the chest band feeling.  Overally, he was feeling better other than yesterday. He did have a hard week with returning to work and losing his mom.     Expected Outcomes Short: Continue to try to pin point band feeling Long: Continue to improve breathing             ITP Comments:  ITP Comments     Row Name 06/09/23 1419 06/29/23 0826 07/11/23 0755 07/27/23 0806     ITP Comments Patient attend orientation today.  Patient is attendingCardiac Rehabilitation Program.  Documentation for diagnosis can be found in [facility] 05/27/23.  Reviewed medical chart, RPE/RPD, gym safety, and program guidelines.  Patient was fitted to equipment they will be using during rehab.  Patient is scheduled to start exercise on Tuesday 06/14/23 at 1500.   Initial ITP created and sent for review and signature by Dr. Dina Rich, Medical Director for Cardiac Rehabilitation Program. 30 day review completed. ITP sent to Dr. Dina Rich, Medical Director of Cardiac Rehab. Continue with ITP unless changes are made by physician.  Program start was help for chest pain by MD. First full day of exercise!  Patient was oriented to gym and equipment including functions, settings, policies, and procedures.  Patient's individual exercise prescription and treatment plan were reviewed.  All starting workloads were established based on the results of the 6 minute walk test done at initial orientation visit.  The plan for exercise progression was also introduced and progression will be customized based on patient's performance and goals. 30 day review completed. ITP sent to Dr. Dina Rich, Medical Director of Cardiac Rehab. Continue with ITP unless changes are made by physician.  Newer to program but has also been traveling.             Comments: 30 day review

## 2023-07-28 ENCOUNTER — Encounter (HOSPITAL_COMMUNITY): Payer: PRIVATE HEALTH INSURANCE

## 2023-08-01 ENCOUNTER — Encounter (HOSPITAL_COMMUNITY)
Admission: RE | Admit: 2023-08-01 | Discharge: 2023-08-01 | Disposition: A | Discharge status: Home / Self Care | Payer: 59 | Source: Ambulatory Visit | Attending: Cardiovascular Disease | Admitting: Cardiovascular Disease

## 2023-08-01 DIAGNOSIS — I213 ST elevation (STEMI) myocardial infarction of unspecified site: Secondary | ICD-10-CM | POA: Insufficient documentation

## 2023-08-01 DIAGNOSIS — Z955 Presence of coronary angioplasty implant and graft: Secondary | ICD-10-CM | POA: Diagnosis present

## 2023-08-01 NOTE — Progress Notes (Signed)
Daily Session Note  Patient Details  Name: Juan Schultz MRN: [id number] Date of Birth: 12/05/1955 Referring Provider:   Flowsheet Row CARDIAC REHAB PHASE II ORIENTATION from 06/09/2023 in [facility] CARDIAC REHABILITATION  Referring Provider Yates Decamp MD  [facility] Cardiologist: Dr. Cristal Deer McAlhany]       Encounter Date: 08/01/2023  Check In:  Session Check In - 08/01/23 1430       Check-In   Supervising physician immediately available to respond to emergencies See telemetry face sheet for immediately available MD    Location [facility]    Staff Present Ross Ludwig, BS, Exercise Physiologist    Staff Present Hulen Luster, BS, RRT, CPFT    Virtual Visit No    Medication changes reported     No    Fall or balance concerns reported    No    Tobacco Cessation No Change    Warm-up and Cool-down Performed on first and last piece of equipment    Resistance Training Performed Yes    VAD Patient? No    PAD/SET Patient? No      Pain Assessment   Currently in Pain? No/denies    Pain Score 0-No pain    Multiple Pain Sites No             Capillary Blood Glucose: No results found for this or any previous visit (from the past 24 hour(s)).    Social History   Tobacco Use  Smoking Status Never  Smokeless Tobacco Never    Goals Met:  Independence with exercise equipment Exercise tolerated well No report of concerns or symptoms today Strength training completed today  Goals Unmet:  Not Applicable  Comments: Pt able to follow exercise prescription today without complaint.  Will continue to monitor for progression.

## 2023-08-02 ENCOUNTER — Encounter (HOSPITAL_COMMUNITY): Payer: PRIVATE HEALTH INSURANCE

## 2023-08-03 ENCOUNTER — Encounter (HOSPITAL_COMMUNITY)
Admission: RE | Admit: 2023-08-03 | Discharge: 2023-08-03 | Disposition: A | Discharge status: Home / Self Care | Payer: 59 | Source: Ambulatory Visit | Attending: Cardiovascular Disease | Admitting: Cardiovascular Disease

## 2023-08-03 DIAGNOSIS — I213 ST elevation (STEMI) myocardial infarction of unspecified site: Secondary | ICD-10-CM

## 2023-08-03 DIAGNOSIS — Z955 Presence of coronary angioplasty implant and graft: Secondary | ICD-10-CM

## 2023-08-03 NOTE — Progress Notes (Signed)
Daily Session Note  Patient Details  Name: Juan Schultz MRN: [id number] Date of Birth: 04-Jun-1956 Referring Provider:   Flowsheet Row CARDIAC REHAB PHASE II ORIENTATION from 06/09/2023 in [facility] CARDIAC REHABILITATION  Referring Provider Yates Decamp MD  [facility] Cardiologist: Dr. Cristal Deer McAlhany]       Encounter Date: 08/03/2023  Check In:  Session Check In - 08/03/23 1400       Check-In   Supervising physician immediately available to respond to emergencies See telemetry face sheet for immediately available MD    Location [facility]    Staff Present Ross Ludwig, BS, Exercise Physiologist;Other    Virtual Visit No    Medication changes reported     No    Fall or balance concerns reported    No    Tobacco Cessation No Change    Warm-up and Cool-down Performed on first and last piece of equipment    Resistance Training Performed Yes    VAD Patient? No    PAD/SET Patient? No      Pain Assessment   Currently in Pain? No/denies    Pain Score 0-No pain    Multiple Pain Sites No             Capillary Blood Glucose: No results found for this or any previous visit (from the past 24 hour(s)).    Social History   Tobacco Use  Smoking Status Never  Smokeless Tobacco Never    Goals Met:  Independence with exercise equipment Exercise tolerated well No report of concerns or symptoms today Strength training completed today  Goals Unmet:  Not Applicable  Comments: Pt able to follow exercise prescription today without complaint.  Will continue to monitor for progression.

## 2023-08-04 ENCOUNTER — Encounter (HOSPITAL_COMMUNITY): Payer: PRIVATE HEALTH INSURANCE

## 2023-08-05 ENCOUNTER — Encounter (HOSPITAL_COMMUNITY)
Admission: RE | Admit: 2023-08-05 | Discharge: 2023-08-05 | Disposition: A | Discharge status: Home / Self Care | Payer: 59 | Source: Ambulatory Visit | Attending: Cardiovascular Disease | Admitting: Cardiovascular Disease

## 2023-08-05 DIAGNOSIS — Z955 Presence of coronary angioplasty implant and graft: Secondary | ICD-10-CM

## 2023-08-05 DIAGNOSIS — I213 ST elevation (STEMI) myocardial infarction of unspecified site: Secondary | ICD-10-CM

## 2023-08-05 NOTE — Progress Notes (Signed)
Daily Session Note  Patient Details  Name: Juan Schultz MRN: [id number] Date of Birth: 1956-06-24 Referring Provider:   Flowsheet Row CARDIAC REHAB PHASE II ORIENTATION from 06/09/2023 in [facility] CARDIAC REHABILITATION  Referring Provider Yates Decamp MD  [facility] Cardiologist: Dr. Cristal Deer McAlhany]       Encounter Date: 08/05/2023  Check In:  Session Check In - 08/05/23 1415       Check-In   Supervising physician immediately available to respond to emergencies See telemetry face sheet for immediately available ER MD    Location [facility]    Staff Present Rodena Medin, RN, BSN;Heather Fredric Mare, BS, Exercise Physiologist;Danny Gala Romney, RN, BSN    Virtual Visit No    Medication changes reported     No    Fall or balance concerns reported    No    Warm-up and Cool-down Performed on first and last piece of equipment    Resistance Training Performed Yes    VAD Patient? No    PAD/SET Patient? No      Pain Assessment   Currently in Pain? No/denies    Pain Score 0-No pain    Multiple Pain Sites No             Capillary Blood Glucose: No results found for this or any previous visit (from the past 24 hours).    Social History   Tobacco Use  Smoking Status Never  Smokeless Tobacco Never    Goals Met:  Proper associated with RPD/PD & O2 Sat Independence with exercise equipment Using PLB without cueing & demonstrates good technique Exercise tolerated well No report of concerns or symptoms today Strength training completed today  Goals Unmet:  Not Applicable  Comments: Pt able to follow exercise prescription today without complaint.  Will continue to monitor for progression.

## 2023-08-08 ENCOUNTER — Encounter (HOSPITAL_COMMUNITY)
Admission: RE | Admit: 2023-08-08 | Discharge: 2023-08-08 | Disposition: A | Discharge status: Home / Self Care | Payer: 59 | Source: Ambulatory Visit | Attending: Cardiovascular Disease | Admitting: Cardiovascular Disease

## 2023-08-08 DIAGNOSIS — I213 ST elevation (STEMI) myocardial infarction of unspecified site: Secondary | ICD-10-CM | POA: Diagnosis not present

## 2023-08-08 DIAGNOSIS — Z955 Presence of coronary angioplasty implant and graft: Secondary | ICD-10-CM

## 2023-08-08 NOTE — Progress Notes (Signed)
Daily Session Note  Patient Details  Name: Juan Schultz MRN: [id number] Date of Birth: September 18, 1955 Referring Provider:   Flowsheet Row CARDIAC REHAB PHASE II ORIENTATION from 06/09/2023 in [facility] CARDIAC REHABILITATION  Referring Provider Yates Decamp MD  [facility] Cardiologist: Dr. Cristal Deer McAlhany]       Encounter Date: 08/08/2023  Check In:  Session Check In - 08/08/23 1430       Check-In   Supervising physician immediately available to respond to emergencies See telemetry face sheet for immediately available MD    Location [facility]    Staff Present Ross Ludwig, BS, Exercise Physiologist;Ivanna Kocak, RN;[name], MA, RCEP, CCRP, CCET    Staff Present Hulen Luster, BS, RRT, CPFT    Virtual Visit No    Medication changes reported     No    Fall or balance concerns reported    No    Tobacco Cessation No Change    Warm-up and Cool-down Performed on first and last piece of equipment    Resistance Training Performed Yes    VAD Patient? No    PAD/SET Patient? No      Pain Assessment   Currently in Pain? No/denies    Pain Score 0-No pain    Multiple Pain Sites No             Capillary Blood Glucose: No results found for this or any previous visit (from the past 24 hours).    Social History   Tobacco Use  Smoking Status Never  Smokeless Tobacco Never    Goals Met:  Independence with exercise equipment Exercise tolerated well No report of concerns or symptoms today Strength training completed today  Goals Unmet:  Not Applicable  Comments: Pt able to follow exercise prescription today without complaint.  Will continue to monitor for progression.

## 2023-08-09 ENCOUNTER — Encounter (HOSPITAL_COMMUNITY): Payer: PRIVATE HEALTH INSURANCE

## 2023-08-10 ENCOUNTER — Encounter (HOSPITAL_COMMUNITY)
Admission: RE | Admit: 2023-08-10 | Discharge: 2023-08-10 | Disposition: A | Discharge status: Home / Self Care | Payer: 59 | Source: Ambulatory Visit | Attending: Cardiovascular Disease | Admitting: Cardiovascular Disease

## 2023-08-10 DIAGNOSIS — Z955 Presence of coronary angioplasty implant and graft: Secondary | ICD-10-CM

## 2023-08-10 DIAGNOSIS — I213 ST elevation (STEMI) myocardial infarction of unspecified site: Secondary | ICD-10-CM

## 2023-08-10 NOTE — Progress Notes (Signed)
Daily Session Note  Patient Details  Name: Juan Schultz MRN: [id number] Date of Birth: 05-13-1956 Referring Provider:   Flowsheet Row CARDIAC REHAB PHASE II ORIENTATION from 06/09/2023 in [facility] CARDIAC REHABILITATION  Referring Provider Juan Decamp MD  [facility] Cardiologist: Dr. Cristal Deer Schultz]       Encounter Date: 08/10/2023  Check In:  Session Check In - 08/10/23 1415       Check-In   Supervising physician immediately available to respond to emergencies See telemetry face sheet for immediately available MD    Location [facility]    Staff Present Ross Ludwig, BS, Exercise Physiologist;[name], MA, RCEP, CCRP, CCET    Virtual Visit No    Medication changes reported     No    Fall or balance concerns reported    No    Tobacco Cessation No Change    Warm-up and Cool-down Performed on first and last piece of equipment    Resistance Training Performed Yes    VAD Patient? No    PAD/SET Patient? No      Pain Assessment   Currently in Pain? No/denies    Pain Score 0-No pain    Multiple Pain Sites No             Capillary Blood Glucose: No results found for this or any previous visit (from the past 24 hours).    Social History   Tobacco Use  Smoking Status Never  Smokeless Tobacco Never    Goals Met:  Independence with exercise equipment Exercise tolerated well No report of concerns or symptoms today Strength training completed today  Goals Unmet:  Not Applicable  Comments: Pt able to follow exercise prescription today without complaint.  Will continue to monitor for progression.

## 2023-08-11 ENCOUNTER — Encounter (HOSPITAL_COMMUNITY): Payer: PRIVATE HEALTH INSURANCE

## 2023-08-12 ENCOUNTER — Encounter (HOSPITAL_COMMUNITY)
Admission: RE | Admit: 2023-08-12 | Discharge: 2023-08-12 | Disposition: A | Discharge status: Home / Self Care | Payer: 59 | Source: Ambulatory Visit | Attending: Cardiovascular Disease | Admitting: Cardiovascular Disease

## 2023-08-12 DIAGNOSIS — Z955 Presence of coronary angioplasty implant and graft: Secondary | ICD-10-CM

## 2023-08-12 DIAGNOSIS — I213 ST elevation (STEMI) myocardial infarction of unspecified site: Secondary | ICD-10-CM | POA: Diagnosis not present

## 2023-08-12 NOTE — Progress Notes (Signed)
Daily Session Note  Patient Details  Name: Juan Schultz MRN: [id number] Date of Birth: March 04, 1956 Referring Provider:   Flowsheet Row CARDIAC REHAB PHASE II ORIENTATION from 06/09/2023 in [facility] CARDIAC REHABILITATION  Referring Provider Yates Decamp MD  [facility] Cardiologist: Dr. Cristal Deer McAlhany]       Encounter Date: 08/12/2023  Check In:  Session Check In - 08/12/23 1415       Check-In   Supervising physician immediately available to respond to emergencies See telemetry face sheet for immediately available ER MD    Location [facility]    Staff Present Rodena Medin, RN, BSN;Phyllis Billingsley, RN    Virtual Visit No    Medication changes reported     No    Fall or balance concerns reported    No    Warm-up and Cool-down Performed on first and last piece of equipment    Resistance Training Performed Yes    VAD Patient? No    PAD/SET Patient? No      Pain Assessment   Currently in Pain? No/denies    Pain Score 0-No pain    Multiple Pain Sites No             Capillary Blood Glucose: No results found for this or any previous visit (from the past 24 hours).    Social History   Tobacco Use  Smoking Status Never  Smokeless Tobacco Never    Goals Met:  Independence with exercise equipment Exercise tolerated well No report of concerns or symptoms today Strength training completed today  Goals Unmet:  Not Applicable  Comments: Pt able to follow exercise prescription today without complaint.  Will continue to monitor for progression.

## 2023-08-15 ENCOUNTER — Encounter (HOSPITAL_COMMUNITY)
Admission: RE | Admit: 2023-08-15 | Discharge: 2023-08-15 | Disposition: A | Discharge status: Home / Self Care | Payer: 59 | Source: Ambulatory Visit | Attending: Cardiovascular Disease | Admitting: Cardiovascular Disease

## 2023-08-15 DIAGNOSIS — I213 ST elevation (STEMI) myocardial infarction of unspecified site: Secondary | ICD-10-CM

## 2023-08-15 DIAGNOSIS — Z955 Presence of coronary angioplasty implant and graft: Secondary | ICD-10-CM

## 2023-08-15 NOTE — Progress Notes (Signed)
Daily Session Note  Patient Details  Name: Juan Schultz MRN: [id number] Date of Birth: [birth year] Referring Provider:   Flowsheet Row CARDIAC REHAB PHASE II ORIENTATION from 06/09/2023 in [facility] CARDIAC REHABILITATION  Referring Provider Yates Decamp MD  [facility] Cardiologist: Dr. Cristal Deer McAlhany]       Encounter Date: 08/15/2023  Check In:  Session Check In - 08/15/23 1430       Check-In   Supervising physician immediately available to respond to emergencies See telemetry face sheet for immediately available ER MD    Location [facility]    Staff Present Rodena Medin, RN, BSN;Quintavius Niebuhr, RN;Jessica [place], MA, RCEP, CCRP, CCET    Virtual Visit No    Medication changes reported     No    Fall or balance concerns reported    No    Warm-up and Cool-down Performed on first and last piece of equipment    Resistance Training Performed Yes    VAD Patient? No    PAD/SET Patient? No      Pain Assessment   Currently in Pain? No/denies    Pain Score 0-No pain    Multiple Pain Sites No             Capillary Blood Glucose: No results found for this or any previous visit (from the past 24 hours).    Social History   Tobacco Use  Smoking Status Never  Smokeless Tobacco Never    Goals Met:  Independence with exercise equipment Exercise tolerated well No report of concerns or symptoms today Strength training completed today  Goals Unmet:  Not Applicable  Comments: Pt able to follow exercise prescription today without complaint.  Will continue to monitor for progression.

## 2023-08-16 ENCOUNTER — Encounter (HOSPITAL_COMMUNITY): Payer: PRIVATE HEALTH INSURANCE

## 2023-08-18 ENCOUNTER — Encounter (HOSPITAL_COMMUNITY): Payer: PRIVATE HEALTH INSURANCE

## 2023-08-22 ENCOUNTER — Encounter (HOSPITAL_COMMUNITY)
Admission: RE | Admit: 2023-08-22 | Discharge: 2023-08-22 | Disposition: A | Discharge status: Home / Self Care | Payer: 59 | Source: Ambulatory Visit | Attending: Cardiovascular Disease | Admitting: Cardiovascular Disease

## 2023-08-22 DIAGNOSIS — Z955 Presence of coronary angioplasty implant and graft: Secondary | ICD-10-CM

## 2023-08-22 DIAGNOSIS — I213 ST elevation (STEMI) myocardial infarction of unspecified site: Secondary | ICD-10-CM

## 2023-08-22 NOTE — Progress Notes (Signed)
Daily Session Note  Patient Details  Name: Juan Schultz MRN: [id number] Date of Birth: 1956/06/23 Referring Provider:   Flowsheet Row CARDIAC REHAB PHASE II ORIENTATION from 06/09/2023 in [facility] CARDIAC REHABILITATION  Referring Provider Yates Decamp MD  [facility] Cardiologist: Dr. Cristal Deer McAlhany]       Encounter Date: 08/22/2023  Check In:  Session Check In - 08/22/23 1430       Check-In   Supervising physician immediately available to respond to emergencies See telemetry face sheet for immediately available MD    Location [facility]    Staff Present Ross Ludwig, BS, Exercise Physiologist;[name], MA, RCEP, CCRP, CCET;Other   Sherrye Payor, RN   Virtual Visit No    Medication changes reported     No    Fall or balance concerns reported    No    Tobacco Cessation No Change    Warm-up and Cool-down Performed on first and last piece of equipment    Resistance Training Performed Yes    VAD Patient? No    PAD/SET Patient? No      Pain Assessment   Currently in Pain? No/denies    Pain Score 0-No pain    Multiple Pain Sites No             Capillary Blood Glucose: No results found for this or any previous visit (from the past 24 hours).    Social History   Tobacco Use  Smoking Status Never  Smokeless Tobacco Never    Goals Met:  Independence with exercise equipment Exercise tolerated well No report of concerns or symptoms today Strength training completed today  Goals Unmet:  Not Applicable  Comments: Pt able to follow exercise prescription today without complaint.  Will continue to monitor for progression.

## 2023-08-23 ENCOUNTER — Encounter (HOSPITAL_COMMUNITY): Payer: PRIVATE HEALTH INSURANCE

## 2023-08-23 ENCOUNTER — Encounter (HOSPITAL_COMMUNITY): Payer: Self-pay | Admitting: *Deleted

## 2023-08-23 DIAGNOSIS — Z955 Presence of coronary angioplasty implant and graft: Secondary | ICD-10-CM

## 2023-08-23 DIAGNOSIS — I213 ST elevation (STEMI) myocardial infarction of unspecified site: Secondary | ICD-10-CM

## 2023-08-23 NOTE — Progress Notes (Signed)
 Cardiac Individual Treatment Plan  Patient Details  Name: Juan Schultz MRN: [id number] Date of Birth: [birth year] Referring Provider:   Flowsheet Row CARDIAC REHAB PHASE II ORIENTATION from 06/09/2023 in [facility] CARDIAC REHABILITATION  Referring Provider Ladona Heinz MD  [Primary Cardiologist: Dr. Lonni McAlhany]       Initial Encounter Date:  Flowsheet Row CARDIAC REHAB PHASE II ORIENTATION from 06/09/2023 in [place] CARDIAC REHABILITATION  Date 06/09/23       Visit Diagnosis: ST elevation myocardial infarction (STEMI), unspecified artery [facility])  Status post coronary artery stent placement  Patient's Home Medications on Admission:  Current Outpatient Medications:    albuterol  (VENTOLIN  HFA) 108 (90 Base) MCG/ACT inhaler, Inhale 2 puffs into the lungs as needed for wheezing or shortness of breath., Disp: , Rfl:    aspirin  EC 81 MG tablet, Take 1 tablet (81 mg total) by mouth daily. Swallow whole., Disp: 90 tablet, Rfl: 3   cetirizine (ZYRTEC) 10 MG tablet, Take 10 mg by mouth daily as needed for allergies., Disp: , Rfl:    Cholecalciferol (D3 PO), Take 1 tablet by mouth daily., Disp: , Rfl:    icosapent  Ethyl (VASCEPA ) 1 g capsule, Take 2 capsules (2 g total) by mouth 2 (two) times daily., Disp: 120 capsule, Rfl: 6   nitroGLYCERIN  (NITROSTAT ) 0.4 MG SL tablet, Place 1 tablet (0.4 mg total) under the tongue every 5 (five) minutes as needed for chest pain., Disp: 25 tablet, Rfl: 3   Omega-3 Fatty Acids (OMEGA-3 FISH OIL) 500 MG CAPS, Take 500 mg by mouth daily. Take 2 daily 1000 mg/day, Disp: , Rfl:    prasugrel  (EFFIENT ) 10 MG TABS tablet, Take 1 tablet (10 mg total) by mouth daily., Disp: 90 tablet, Rfl: 3   ranolazine  (RANEXA ) 500 MG 12 hr tablet, Take 1 tablet (500 mg total) by mouth 2 (two) times daily., Disp: 60 tablet, Rfl: 5   rosuvastatin  (CRESTOR ) 20 MG tablet, Take 1 tablet (20 mg total) by mouth daily., Disp: 90 tablet, Rfl: 3  Past Medical History: Past  Medical History:  Diagnosis Date   Seasonal allergies     Tobacco Use: Social History   Tobacco Use  Smoking Status Never  Smokeless Tobacco Never    Labs: Review Flowsheet       Latest Ref Rng & Units 05/27/2023  Labs for ITP Cardiac and Pulmonary Rehab  Cholestrol 0 - 200 mg/dL 648   LDL (calc) 0 - 99 mg/dL 768   HDL-C >59 mg/dL 58   Trlycerides <849 mg/dL 687   Hemoglobin J8r 4.8 - 5.6 % 5.8     Capillary Blood Glucose: Lab Results  Component Value Date   GLUCAP 150 (H) 05/27/2023     Exercise Target Goals: Exercise Program Goal: Individual exercise prescription set using results from initial 6 min walk test and THRR while considering  patient's activity barriers and safety.   Exercise Prescription Goal: Starting with aerobic activity 30 plus minutes a day, 3 days per week for initial exercise prescription. Provide home exercise prescription and guidelines that participant acknowledges understanding prior to discharge.  Activity Barriers & Risk Stratification:  Activity Barriers & Cardiac Risk Stratification - 06/09/23 1436       Activity Barriers & Cardiac Risk Stratification   Activity Barriers Arthritis;Shortness of Breath;Chest Pain/Angina;Joint Problems   R knee is bone on bone   Cardiac Risk Stratification Moderate             6 Minute Walk:  6 Minute  Walk     Row Name 06/09/23 1434         6 Minute Walk   Phase Initial     Distance 1440 feet     Walk Time 6 minutes     # of Rest Breaks 0     MPH 2.72     METS 3.75     RPE 11     Perceived Dyspnea  1     VO2 Peak 13.13     Symptoms Yes (comment)     Comments chest cloudy 4/10, SOB     Resting HR 91 bpm     Resting BP 102/64     Resting Oxygen Saturation  96 %     Exercise Oxygen Saturation  during 6 min walk 99 %     Max Ex. HR 108 bpm     Max Ex. BP 134/62     2 Minute Post BP 120/66              Oxygen Initial Assessment:   Oxygen Re-Evaluation:   Oxygen  Discharge (Final Oxygen Re-Evaluation):   Initial Exercise Prescription:  Initial Exercise Prescription - 06/09/23 1400       Date of Initial Exercise RX and Referring Provider   Date 06/09/23    Referring Provider Ladona Heinz MD   Primary Cardiologist: Dr. Lonni Cash     Oxygen   Maintain Oxygen Saturation 88% or higher      Treadmill   MPH 2.7    Grade 0.5    Minutes 15    METs 3.25      REL-XR   Level 3    Speed 50    Minutes 15    METs 3      Prescription Details   Frequency (times per week) 2    Duration Progress to 30 minutes of continuous aerobic without signs/symptoms of physical distress      Intensity   THRR 40-80% of Max Heartrate 117-142    Ratings of Perceived Exertion 11-13    Perceived Dyspnea 0-4      Progression   Progression Continue to progress workloads to maintain intensity without signs/symptoms of physical distress.      Resistance Training   Training Prescription Yes    Weight 3 lb    Reps 10-15             Perform Capillary Blood Glucose checks as needed.  Exercise Prescription Changes:   Exercise Prescription Changes     Row Name 06/09/23 1400 07/11/23 1400           Response to Exercise   Blood Pressure (Admit) 102/64 122/64      Blood Pressure (Exercise) 134/62 124/62      Blood Pressure (Exit) 120/66 126/72      Heart Rate (Admit) 91 bpm 2.03 bpm      Heart Rate (Exercise) 108 bpm 92 bpm      Heart Rate (Exit) 90 bpm 119 bpm      Oxygen Saturation (Admit) 96 % 97 %      Oxygen Saturation (Exercise) 99 % --      Rating of Perceived Exertion (Exercise) 11 13      Perceived Dyspnea (Exercise) 1 --      Symptoms chest cloudy, SOB --      Comments walk test restuls --      Duration -- Continue with 30 min of aerobic exercise without signs/symptoms of physical distress.  Intensity -- THRR unchanged        Progression   Progression -- Continue to progress workloads to maintain intensity without  signs/symptoms of physical distress.        Resistance Training   Training Prescription -- Yes      Weight -- 4      Reps -- 10-15        Treadmill   MPH -- 1.8      Grade -- 0.5      Minutes -- 15      METs -- 2.5        REL-XR   Level -- 3      Speed -- 54      Minutes -- 15      METs -- 4        Oxygen   Maintain Oxygen Saturation -- 88% or higher               Exercise Comments:   Exercise Comments     Row Name 07/11/23 0756           Exercise Comments First full day of exercise!  Patient was oriented to gym and equipment including functions, settings, policies, and procedures.  Patient's individual exercise prescription and treatment plan were reviewed.  All starting workloads were established based on the results of the 6 minute walk test done at initial orientation visit.  The plan for exercise progression was also introduced and progression will be customized based on patient's performance and goals.                Exercise Goals and Review:   Exercise Goals     Row Name 06/09/23 1441             Exercise Goals   Increase Physical Activity Yes       Intervention Provide advice, education, support and counseling about physical activity/exercise needs.;Develop an individualized exercise prescription for aerobic and resistive training based on initial evaluation findings, risk stratification, comorbidities and participant's personal goals.       Expected Outcomes Short Term: Attend rehab on a regular basis to increase amount of physical activity.;Long Term: Add in home exercise to make exercise part of routine and to increase amount of physical activity.;Long Term: Exercising regularly at least 3-5 days a week.       Increase Strength and Stamina Yes       Intervention Provide advice, education, support and counseling about physical activity/exercise needs.;Develop an individualized exercise prescription for aerobic and resistive training based on  initial evaluation findings, risk stratification, comorbidities and participant's personal goals.       Expected Outcomes Short Term: Increase workloads from initial exercise prescription for resistance, speed, and METs.;Short Term: Perform resistance training exercises routinely during rehab and add in resistance training at home;Long Term: Improve cardiorespiratory fitness, muscular endurance and strength as measured by increased METs and functional capacity ( )       Able to understand and use rate of perceived exertion (RPE) scale Yes       Intervention Provide education and explanation on how to use RPE scale       Expected Outcomes Short Term: Able to use RPE daily in rehab to express subjective intensity level;Long Term:  Able to use RPE to guide intensity level when exercising independently       Able to understand and use Dyspnea scale Yes       Intervention Provide education and explanation on how to  use Dyspnea scale       Expected Outcomes Short Term: Able to use Dyspnea scale daily in rehab to express subjective sense of shortness of breath during exertion;Long Term: Able to use Dyspnea scale to guide intensity level when exercising independently       Knowledge and understanding of Target Heart Rate Range (THRR) Yes       Intervention Provide education and explanation of THRR including how the numbers were predicted and where they are located for reference       Expected Outcomes Short Term: Able to state/look up THRR;Long Term: Able to use THRR to govern intensity when exercising independently;Short Term: Able to use daily as guideline for intensity in rehab       Able to check pulse independently Yes       Intervention Provide education and demonstration on how to check pulse in carotid and radial arteries.;Review the importance of being able to check your own pulse for safety during independent exercise       Expected Outcomes Short Term: Able to explain why pulse checking is  important during independent exercise;Long Term: Able to check pulse independently and accurately       Understanding of Exercise Prescription Yes       Intervention Provide education, explanation, and written materials on patient's individual exercise prescription       Expected Outcomes Short Term: Able to explain program exercise prescription;Long Term: Able to explain home exercise prescription to exercise independently                Exercise Goals Re-Evaluation :  Exercise Goals Re-Evaluation     Row Name 07/11/23 0756 07/11/23 1431 07/18/23 0805 08/08/23 1450       Exercise Goal Re-Evaluation   Exercise Goals Review Able to understand and use rate of perceived exertion (RPE) scale;Able to understand and use Dyspnea scale;Knowledge and understanding of Target Heart Rate Range (THRR);Understanding of Exercise Prescription Increase Physical Activity;Increase Strength and Stamina;Understanding of Exercise Prescription Increase Physical Activity;Increase Strength and Stamina;Able to understand and use rate of perceived exertion (RPE) scale;Able to understand and use Dyspnea scale;Knowledge and understanding of Target Heart Rate Range (THRR);Able to check pulse independently;Understanding of Exercise Prescription Increase Physical Activity;Increase Strength and Stamina;Understanding of Exercise Prescription    Comments Reviewed RPE and dyspnea scale, THR and program prescription with pt today.  Pt voiced understanding and was given a copy of goals to take home. Juan Schultz has his first day of exercise. He did good with exercise, he needs to get use to the treadmill before increasing. He ids at level 3 on the XR with a MEt of 4.0. Will continue to monitor and progreess as able. Juan Schultz is off to a good start in rehab.  He noted he was very tired and washed out after church yesterday.  Reviewed home exercise with pt today.  Pt plans to walk and use bike and staff videos at home for exercise.  Reviewed THR,  pulse, RPE, sign and symptoms, pulse oximetery and when to call 911 or MD.  Also discussed weather considerations and indoor options.  Pt voiced understanding. Juan Schultz continues to do well inn rehab. He is increasing his speeds on the treadmill and level on the XR. He is continuing to feel his fatigue by the end of the week since starting baclk work. He is hoping by exercising his endutrace will increase.    Expected Outcomes Short: Use RPE daily to regulate intensity.  Long: Follow program prescription  in THR. Short: continue to get use to the treadmill   long termL continue to attend rehab Short: Continue to add in exercise at home Long: Continue to build up stamina. Short: Continue to add in exercise at home Long: Continue to build up stamina.              Discharge Exercise Prescription (Final Exercise Prescription Changes):  Exercise Prescription Changes - 07/11/23 1400       Response to Exercise   Blood Pressure (Admit) 122/64    Blood Pressure (Exercise) 124/62    Blood Pressure (Exit) 126/72    Heart Rate (Admit) 2.03 bpm    Heart Rate (Exercise) 92 bpm    Heart Rate (Exit) 119 bpm    Oxygen Saturation (Admit) 97 %    Rating of Perceived Exertion (Exercise) 13    Duration Continue with 30 min of aerobic exercise without signs/symptoms of physical distress.    Intensity THRR unchanged      Progression   Progression Continue to progress workloads to maintain intensity without signs/symptoms of physical distress.      Resistance Training   Training Prescription Yes    Weight 4    Reps 10-15      Treadmill   MPH 1.8    Grade 0.5    Minutes 15    METs 2.5      REL-XR   Level 3    Speed 54    Minutes 15    METs 4      Oxygen   Maintain Oxygen Saturation 88% or higher             Nutrition:  Target Goals: Understanding of nutrition guidelines, daily intake of sodium 1500mg , cholesterol 200mg , calories 30% from fat and 7% or less from saturated fats, daily to  have 5 or more servings of fruits and vegetables.  Biometrics:  Pre Biometrics - 06/09/23 1441       Pre Biometrics   Height 5' 6.5 (1.689 m)    Weight 146 lb 6.4 oz (66.4 kg)    Waist Circumference 32 inches    Hip Circumference 33.5 inches    Waist to Hip Ratio 0.96 %    BMI (Calculated) 23.28    Grip Strength 34.5 kg    Single Leg Stand 16.5 seconds              Nutrition Therapy Plan and Nutrition Goals:   Nutrition Assessments:  MEDIFICTS Score Key: >=70 Need to make dietary changes  40-70 Heart Healthy Diet <= 40 Therapeutic Level Cholesterol Diet  Flowsheet Row CARDIAC VIRTUAL BASED CARE from 06/08/2023 in [facility] CARDIAC REHABILITATION  Picture Your Plate Total Score on Admission 39      Picture Your Plate Scores: <59 Unhealthy dietary pattern with much room for improvement. 41-50 Dietary pattern unlikely to meet recommendations for good health and room for improvement. 51-60 More healthful dietary pattern, with some room for improvement.  >60 Healthy dietary pattern, although there may be some specific behaviors that could be improved.    Nutrition Goals Re-Evaluation:  Nutrition Goals Re-Evaluation     Row Name 07/18/23 9187 08/08/23 1503           Goals   Nutrition Goal Heart Healthy Diet Heart Healthy Diet      Comment Juan Schultz is doing well in rehab.  He has been following heart healthy diet since January.  He has been watching his sugar.  He has stayed away from  desserts.  He tries to avoid processed foods and focuses on low carb diet.  He has been trying to get a enough protein.  He has been eating canned fish to get the omega 3s.  He has been trying to stick to vegetable oils versus seed oils. Juan Schultz is doing well with his diet. He continues to follow a healty diet sicne january. He continues to watch his sugars and his carbs. He does not eat a lot of bread.      Expected Outcome Short: Check salt content in canned fish Long: Continue to watch fat  and sugar Short: Check salt content in canned fish Long: Continue to watch fat and sugar               Nutrition Goals Discharge (Final Nutrition Goals Re-Evaluation):  Nutrition Goals Re-Evaluation - 08/08/23 1503       Goals   Nutrition Goal Heart Healthy Diet    Comment Juan Schultz is doing well with his diet. He continues to follow a healty diet sicne january. He continues to watch his sugars and his carbs. He does not eat a lot of bread.    Expected Outcome Short: Check salt content in canned fish Long: Continue to watch fat and sugar             Psychosocial: Target Goals: Acknowledge presence or absence of significant depression and/or stress, maximize coping skills, provide positive support system. Participant is able to verbalize types and ability to use techniques and skills needed for reducing stress and depression.  Initial Review & Psychosocial Screening:  Initial Psych Review & Screening - 06/08/23 1343       Initial Review   Current issues with None Identified      Family Dynamics   Good Support System? Yes      Barriers   Psychosocial barriers to participate in program There are no identifiable barriers or psychosocial needs.;The patient should benefit from training in stress management and relaxation.      Screening Interventions   Interventions To provide support and resources with identified psychosocial needs;Encouraged to exercise;Provide feedback about the scores to participant    Expected Outcomes Short Term goal: Utilizing psychosocial counselor, staff and physician to assist with identification of specific Stressors or current issues interfering with healing process. Setting desired goal for each stressor or current issue identified.;Long Term Goal: Stressors or current issues are controlled or eliminated.;Short Term goal: Identification and review with participant of any Quality of Life or Depression concerns found by scoring the questionnaire.;Long Term  goal: The participant improves quality of Life and PHQ9 Scores as seen by post scores and/or verbalization of changes             Quality of Life Scores:  Quality of Life - 06/08/23 1342       Quality of Life   Select Quality of Life      Quality of Life Scores   Health/Function Pre 23.67 %    Socioeconomic Pre 22.81 %    Psych/Spiritual Pre 26.93 %    Family Pre 30 %    GLOBAL Pre 25.03 %            Scores of 19 and below usually indicate a poorer quality of life in these areas.  A difference of  2-3 points is a clinically meaningful difference.  A difference of 2-3 points in the total score of the Quality of Life Index has been associated with significant improvement in overall  quality of life, self-image, physical symptoms, and general health in studies assessing change in quality of life.  PHQ-9: Review Flowsheet       07/11/2023  Depression screen PHQ 2/9  Decreased Interest 0  Down, Depressed, Hopeless 0  PHQ - 2 Score 0  Altered sleeping 1  Tired, decreased energy 1  Change in appetite 0  Feeling bad or failure about yourself  0  Trouble concentrating 1  Moving slowly or fidgety/restless 0  Suicidal thoughts 0  PHQ-9 Score 3  Difficult doing work/chores Somewhat difficult   Interpretation of Total Score  Total Score Depression Severity:  1-4 = Minimal depression, 5-9 = Mild depression, 10-14 = Moderate depression, 15-19 = Moderately severe depression, 20-27 = Severe depression   Psychosocial Evaluation and Intervention:  Psychosocial Evaluation - 06/08/23 1344       Psychosocial Evaluation & Interventions   Interventions Stress management education;Relaxation education;Encouraged to exercise with the program and follow exercise prescription    Comments Patient was referred to CR with STEMI/Stent placement 05/27/23. He has been having some issues with hypotension since his discharge with some light-headedness and general malaise. His losartan  was  discontinued with improved symptoms and blood pressure. 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 He has not returned to work but plans to return once released from his cardiologist. He lives with his wife who is his main support person. His son and daughter-in-law also support him. He denies any depression, anxiety or major stressors in his life. He says he sleeps well most of the time. He usually gets 6 hours/night which he says is good for him. He is motivated to start the program to improve his health overall. He also wants to improve his stamina; feel better overall and get back to normal. He also enjoys hiking and would like to be able to do this again. He has no barriers identified to participate in the program.    Expected Outcomes Short Term: Start the program and attend consistently. Long Term: Meet his personal goals.    Continue Psychosocial Services  Follow up required by staff             Psychosocial Re-Evaluation:  Psychosocial Re-Evaluation     Row Name 07/18/23 0807 08/08/23 1504           Psychosocial Re-Evaluation   Current issues with Current Stress Concerns Current Stress Concerns      Comments Juan Schultz returned to work last week.  He was worn out after about 5 hours at work and went to see his doctor about it last week.  Yesterday after church he took a nap and didn't feel like going to parade in afternoon.  His attendees were happy to see him back at work.  He has been sleeping fairly well and feeling good in morning.  He did have a hard time getting to sleep last week.  He also just had his mom passed last week.  He has had a tough week overall.  He is grieving but not feeling depressed. Juan Schultz has been doing well in rehab. He continues to be tired by the end of the week. He is hoping with his endurance building in rehab his energy will increase. I told him to contact his doctor about fatigue if he sees no improvement. He  has stopped his crestor  this weekend due to having muscle spasms and stomach ache. I told him to call and imform his doctor about the med  change.      Expected Outcomes Short: Continue to cope with losing his mom Long: Continue to adjust returning to work Short: call PCP and give updates about fatigue and stopping his crestor    long : continues to adjust to returing to work and exericse      Interventions Encouraged to attend Cardiac Rehabilitation for the exercise Encouraged to attend Cardiac Rehabilitation for the exercise      Continue Psychosocial Services  Follow up required by staff Follow up required by staff               Psychosocial Discharge (Final Psychosocial Re-Evaluation):  Psychosocial Re-Evaluation - 08/08/23 1504       Psychosocial Re-Evaluation   Current issues with Current Stress Concerns    Comments Juan Schultz has been doing well in rehab. He continues to be tired by the end of the week. He is hoping with his endurance building in rehab his energy will increase. I told him to contact his doctor about fatigue if he sees no improvement. He has stopped his crestor  this weekend due to having muscle spasms and stomach ache. I told him to call and imform his doctor about the med change.    Expected Outcomes Short: call PCP and give updates about fatigue and stopping his crestor    long : continues to adjust to returing to work and exericse    Interventions Encouraged to attend Cardiac Rehabilitation for the exercise    Continue Psychosocial Services  Follow up required by staff             Vocational Rehabilitation: Provide vocational rehab assistance to qualifying candidates.   Vocational Rehab Evaluation & Intervention:  Vocational Rehab - 06/08/23 1339       Initial Vocational Rehab Evaluation & Intervention   Assessment shows need for Vocational Rehabilitation No      Vocational Rehab Re-Evaulation   Comments Patient plans to return to his job once released from  cardiologist.             Education: Education Goals: Education classes will be provided on a weekly basis, covering required topics. Participant will state understanding/return demonstration of topics presented.  Learning Barriers/Preferences:  Learning Barriers/Preferences - 06/08/23 1339       Learning Barriers/Preferences   Learning Barriers None    Learning Preferences Audio;Skilled Demonstration             Education Topics: Hypertension, Hypertension Reduction -Define heart disease and high blood pressure. Discus how high blood pressure affects the body and ways to reduce high blood pressure.   Exercise and Your Heart -Discuss why it is important to exercise, the FITT principles of exercise, normal and abnormal responses to exercise, and how to exercise safely. Flowsheet Row CARDIAC REHAB PHASE II EXERCISE from 08/10/2023 in [place] CARDIAC REHABILITATION  Date 08/03/23  Educator hb  Instruction Review Code 1- Verbalizes Understanding       Angina -Discuss definition of angina, causes of angina, treatment of angina, and how to decrease risk of having angina.   Cardiac Medications -Review what the following cardiac medications are used for, how they affect the body, and side effects that may occur when taking the medications.  Medications include Aspirin , Beta blockers, calcium  channel blockers, ACE Inhibitors, angiotensin receptor blockers, diuretics, digoxin, and antihyperlipidemics.   Congestive Heart Failure -Discuss the definition of CHF, how to live with CHF, the signs and symptoms of CHF, and how keep track of weight and sodium intake.  Heart Disease and Intimacy -Discus the effect sexual activity has on the heart, how changes occur during intimacy as we age, and safety during sexual activity.   Smoking Cessation / COPD -Discuss different methods to quit smoking, the health benefits of quitting smoking, and the definition of  COPD.   Nutrition I: Fats -Discuss the types of cholesterol, what cholesterol does to the heart, and how cholesterol levels can be controlled. Flowsheet Row CARDIAC REHAB PHASE II EXERCISE from 08/10/2023 in [facility]  Date 08/10/23  Educator jh  Instruction Review Code 1- Verbalizes Understanding       Nutrition II: Labels -Discuss the different components of food labels and how to read food label   Heart Parts/Heart Disease and PAD -Discuss the anatomy of the heart, the pathway of blood circulation through the heart, and these are affected by heart disease. Flowsheet Row CARDIAC REHAB PHASE II EXERCISE from 08/10/2023 in [facility]  Date 07/13/23  Educator hb  Instruction Review Code 1- Verbalizes Understanding       Stress I: Signs and Symptoms -Discuss the causes of stress, how stress may lead to anxiety and depression, and ways to limit stress.   Stress II: Relaxation -Discuss different types of relaxation techniques to limit stress.   Warning Signs of Stroke / TIA -Discuss definition of a stroke, what the signs and symptoms are of a stroke, and how to identify when someone is having stroke.   Knowledge Questionnaire Score:  Knowledge Questionnaire Score - 06/08/23 1342       Knowledge Questionnaire Score   Pre Score 22/26             Core Components/Risk Factors/Patient Goals at Admission:  Personal Goals and Risk Factors at Admission - 06/09/23 1443       Core Components/Risk Factors/Patient Goals on Admission    Weight Management Weight Maintenance;Yes    Intervention Weight Management: Develop a combined nutrition and exercise program designed to reach desired caloric intake, while maintaining appropriate intake of nutrient and fiber, sodium and fats, and appropriate energy expenditure required for the weight goal.;Weight Management: Provide education and appropriate resources to help participant work on  and attain dietary goals.    Admit Weight 146 lb 6.4 oz (66.4 kg)    Goal Weight: Short Term 146 lb (66.2 kg)    Goal Weight: Long Term 146 lb (66.2 kg)    Expected Outcomes Short Term: Continue to assess and modify interventions until short term weight is achieved;Long Term: Adherence to nutrition and physical activity/exercise program aimed toward attainment of established weight goal;Weight Maintenance: Understanding of the daily nutrition guidelines, which includes 25-35% calories from fat, 7% or less cal from saturated fats, less than 200mg  cholesterol, less than 1.5gm of sodium, & 5 or more servings of fruits and vegetables daily    Improve shortness of breath with ADL's Yes    Intervention Provide education, individualized exercise plan and daily activity instruction to help decrease symptoms of SOB with activities of daily living.    Expected Outcomes Short Term: Improve cardiorespiratory fitness to achieve a reduction of symptoms when performing ADLs;Long Term: Be able to perform more ADLs without symptoms or delay the onset of symptoms    Lipids Yes    Intervention Provide education and support for participant on nutrition & aerobic/resistive exercise along with prescribed medications to achieve LDL 70mg , HDL >40mg .    Expected Outcomes Short Term: Participant states understanding of desired cholesterol values and is  compliant with medications prescribed. Participant is following exercise prescription and nutrition guidelines.;Long Term: Cholesterol controlled with medications as prescribed, with individualized exercise RX and with personalized nutrition plan. Value goals: LDL < 70mg , HDL > 40 mg.             Core Components/Risk Factors/Patient Goals Review:   Goals and Risk Factor Review     Row Name 07/18/23 0816 08/08/23 1454           Core Components/Risk Factors/Patient Goals Review   Personal Goals Review Weight Management/Obesity;Lipids;Improve shortness of breath with  ADL's Weight Management/Obesity;Lipids;Improve shortness of breath with ADL's      Review Juan Schultz is doing well in rehab.  His weight is holding steady around 142 lb.  He is watching his cholesterol levels and trying to get a lot of omega 3.  His pressures are doing well.  He had been doing well, but yesterday had the band feeling around his chest after church and was really tired too.  His breathing will come and go with the chest band feeling.  Overally, he was feeling better other than yesterday. He did have a hard week with returning to work and losing his mom. Patcontinue to do well in rehab. His blood pressure is staying WNL and been good at rehab and home. His weight is staying between 142-147lbs and he is happy with that.      Expected Outcomes Short: Continue to try to pin point band feeling Long: Continue to improve breathing Short: contiunue to montior risk factors   long term: continue to exercise for overall wellbeing               Core Components/Risk Factors/Patient Goals at Discharge (Final Review):   Goals and Risk Factor Review - 08/08/23 1454       Core Components/Risk Factors/Patient Goals Review   Personal Goals Review Weight Management/Obesity;Lipids;Improve shortness of breath with ADL's    Review Patcontinue to do well in rehab. His blood pressure is staying WNL and been good at rehab and home. His weight is staying between 142-147lbs and he is happy with that.    Expected Outcomes Short: contiunue to montior risk factors   long term: continue to exercise for overall wellbeing             ITP Comments:  ITP Comments     Row Name 06/09/23 1419 06/29/23 0826 07/11/23 0755 07/27/23 0806 08/23/23 1002   ITP Comments Patient attend orientation today.  Patient is attendingCardiac Rehabilitation Program.  Documentation for diagnosis can be found in [facility] 05/27/23.  Reviewed medical chart, RPE/RPD, gym safety, and program guidelines.  Patient was fitted to equipment they will  be using during rehab.  Patient is scheduled to start exercise on Tuesday 06/14/23 at 1500.   Initial ITP created and sent for review and signature by Dr. Dorn Ross, Medical Director for Cardiac Rehabilitation Program. 30 day review completed. ITP sent to Dr. Dorn Ross, Medical Director of Cardiac Rehab. Continue with ITP unless changes are made by physician.  Program start was help for chest pain by MD. First full day of exercise!  Patient was oriented to gym and equipment including functions, settings, policies, and procedures.  Patient's individual exercise prescription and treatment plan were reviewed.  All starting workloads were established based on the results of the 6 minute walk test done at initial orientation visit.  The plan for exercise progression was also introduced and progression will be customized based on patient's performance  and goals. 30 day review completed. ITP sent to Dr. Dorn Ross, Medical Director of Cardiac Rehab. Continue with ITP unless changes are made by physician.  Newer to program but has also been traveling. 30 day review completed. ITP sent to Dr. Dorn Ross, Medical Director of Cardiac Rehab. Continue with ITP unless changes are made by physician.            Comments: 30 day review

## 2023-08-25 ENCOUNTER — Encounter (HOSPITAL_COMMUNITY): Payer: PRIVATE HEALTH INSURANCE

## 2023-08-29 ENCOUNTER — Encounter (HOSPITAL_COMMUNITY)
Admission: RE | Admit: 2023-08-29 | Discharge: 2023-08-29 | Disposition: A | Discharge status: Home / Self Care | Payer: PRIVATE HEALTH INSURANCE | Source: Ambulatory Visit | Attending: Cardiovascular Disease | Admitting: Cardiovascular Disease

## 2023-08-29 DIAGNOSIS — I213 ST elevation (STEMI) myocardial infarction of unspecified site: Secondary | ICD-10-CM | POA: Insufficient documentation

## 2023-08-29 DIAGNOSIS — Z955 Presence of coronary angioplasty implant and graft: Secondary | ICD-10-CM | POA: Insufficient documentation

## 2023-08-29 NOTE — Progress Notes (Signed)
 Daily Session Note  Patient Details  Name: Juan Schultz MRN: [id number] Date of Birth: 14-Jan-1956 Referring Provider:   Flowsheet Row CARDIAC REHAB PHASE II ORIENTATION from 06/09/2023 in [facility] CARDIAC REHABILIT[facility]  Referring Provider Ladona Heinz MD  [facility] Cardiologist: Dr. Lonni McAlhany]       Encounter Date: 08/29/2023  Check In:  Session Check In - 08/29/23 1427       Check-In   Supervising physician immediately available to respond to emergencies See telemetry face sheet for immediately available MD    Location [facility]    Staff Present Powell Benders, BS, Exercise Physiologist;Debra Vicci, RN, BSN    Virtual Visit No    Medication changes reported     No    Fall or balance concerns reported    No    Tobacco Cessation No Change    Warm-up and Cool-down Performed on first and last piece of equipment    Resistance Training Performed Yes    VAD Patient? No    PAD/SET Patient? No      Pain Assessment   Currently in Pain? No/denies    Pain Score 0-No pain    Multiple Pain Sites No             Capillary Blood Glucose: No results found for this or any previous visit (from the past 24 hours).    Social History   Tobacco Use  Smoking Status Never  Smokeless Tobacco Never    Goals Met:  Independence with exercise equipment Exercise tolerated well No report of concerns or symptoms today Strength training completed today  Goals Unmet:  Not Applicable  Comments: Pt able to follow exercise prescription today without complaint.  Will continue to monitor for progression.

## 2023-08-30 ENCOUNTER — Encounter (HOSPITAL_COMMUNITY): Payer: PRIVATE HEALTH INSURANCE

## 2023-08-31 ENCOUNTER — Encounter (HOSPITAL_COMMUNITY)
Admission: RE | Admit: 2023-08-31 | Discharge: 2023-08-31 | Disposition: A | Discharge status: Home / Self Care | Payer: PRIVATE HEALTH INSURANCE | Source: Ambulatory Visit | Attending: Cardiovascular Disease | Admitting: Cardiovascular Disease

## 2023-08-31 DIAGNOSIS — I213 ST elevation (STEMI) myocardial infarction of unspecified site: Secondary | ICD-10-CM

## 2023-08-31 DIAGNOSIS — Z955 Presence of coronary angioplasty implant and graft: Secondary | ICD-10-CM | POA: Diagnosis not present

## 2023-08-31 NOTE — Progress Notes (Signed)
 Daily Session Note  Patient Details  Name: Juan Schultz MRN: [id number] Date of Birth: Jun 16, 1956 Referring Provider:   Flowsheet Row CARDIAC REHAB PHASE II ORIENTATION from 06/09/2023 in [facility] C[facility]IAC RE[facility]  Referring Provider Ladona Heinz MD  [facility] Cardiologist: Dr. Lonni McAlhany]       Encounter Date: 08/31/2023  Check In:   Capillary Blood Glucose: No results found for this or any previous visit (from the past 24 hours).    Social History   Tobacco Use  Smoking Status Never  Smokeless Tobacco Never    Goals Met:  Independence with exercise equipment Exercise tolerated well No report of concerns or symptoms today Strength training completed today  Goals Unmet:  Not Applicable  Comments: Pt able to follow exercise prescription today without complaint.  Will continue to monitor for progression.

## 2023-09-01 ENCOUNTER — Encounter (HOSPITAL_COMMUNITY): Payer: PRIVATE HEALTH INSURANCE

## 2023-09-01 ENCOUNTER — Telehealth: Payer: Self-pay | Admitting: Cardiovascular Disease

## 2023-09-01 NOTE — Telephone Encounter (Signed)
 Spoke with patient and he stated he had muscle cramps since taking crestor. He stopped taking his crestor 2 weeks and the muscle cramps has stopped. He would like an alternative.

## 2023-09-01 NOTE — Telephone Encounter (Signed)
 Patient stopped by to say that he stopped taking Crestor about two weeks ago because of muscle aches and  cramping - He would like someone to reach out to him to discuss.  Thank you.

## 2023-09-02 NOTE — Telephone Encounter (Signed)
 Message to [facility] to schedule patient with [facility].

## 2023-09-05 ENCOUNTER — Other Ambulatory Visit (HOSPITAL_COMMUNITY): Payer: Self-pay

## 2023-09-05 ENCOUNTER — Encounter (HOSPITAL_COMMUNITY)
Admission: RE | Admit: 2023-09-05 | Discharge: 2023-09-05 | Disposition: A | Discharge status: Home / Self Care | Payer: PRIVATE HEALTH INSURANCE | Source: Ambulatory Visit | Attending: Cardiovascular Disease | Admitting: Cardiovascular Disease

## 2023-09-05 DIAGNOSIS — I213 ST elevation (STEMI) myocardial infarction of unspecified site: Secondary | ICD-10-CM

## 2023-09-05 DIAGNOSIS — Z955 Presence of coronary angioplasty implant and graft: Secondary | ICD-10-CM

## 2023-09-05 MED ORDER — ATORVASTATIN CALCIUM 20 MG PO TABS
20.0000 mg | ORAL_TABLET | Freq: Every day | ORAL | 3 refills | Status: DC
Start: 2023-09-05 — End: 2023-12-09
  Filled 2023-09-05: qty 90, 90d supply, fill #0

## 2023-09-05 NOTE — Progress Notes (Signed)
 Daily Session Note  Patient Details  Name: Juan Schultz MRN: [id number] Date of Birth: 1955/11/30 Referring Provider:   Flowsheet Row CARDIAC REHAB PHASE II ORIENTATION from 06/09/2023 in [facility] CARDIAC REHABILITATION  Referring Provider Ladona Heinz MD  [facility] Cardiologist: Dr. Lonni McAlhany]       Encounter Date: 09/05/2023  Check In:  Session Check In - 09/05/23 1430       Check-In   Supervising physician immediately available to respond to emergencies See telemetry face sheet for immediately available MD    Location [facility]    Staff Present Powell Benders, BS, Exercise Physiologist;Phyllis Billingsley, RN    Virtual Visit No    Medication changes reported     No    Fall or balance concerns reported    No    Tobacco Cessation No Change    Warm-up and Cool-down Performed on first and last piece of equipment    Resistance Training Performed Yes    VAD Patient? No    PAD/SET Patient? No      Pain Assessment   Currently in Pain? No/denies    Pain Score 0-No pain    Multiple Pain Sites No             Capillary Blood Glucose: No results found for this or any previous visit (from the past 24 hours).    Social History   Tobacco Use  Smoking Status Never  Smokeless Tobacco Never    Goals Met:  Independence with exercise equipment Exercise tolerated well No report of concerns or symptoms today Strength training completed today  Goals Unmet:  Not Applicable  Comments: Pt able to follow exercise prescription today without complaint.  Will continue to monitor for progression.

## 2023-09-05 NOTE — Telephone Encounter (Signed)
 Spoke to patient, reports he has new insurance will try to apply for PCSK9i. Willing to retry Lipitor low dose ( stomach pain from 80 mg once a day dose)   LDL goal: <55 mg/dl last LDLc  161 mg/dl (09/60/4540)

## 2023-09-06 ENCOUNTER — Encounter (HOSPITAL_COMMUNITY): Payer: PRIVATE HEALTH INSURANCE

## 2023-09-07 ENCOUNTER — Other Ambulatory Visit (HOSPITAL_COMMUNITY): Payer: Self-pay

## 2023-09-07 ENCOUNTER — Telehealth: Payer: Self-pay | Admitting: Pharmacy Technician

## 2023-09-07 ENCOUNTER — Encounter (HOSPITAL_COMMUNITY): Payer: PRIVATE HEALTH INSURANCE

## 2023-09-07 DIAGNOSIS — E785 Hyperlipidemia, unspecified: Secondary | ICD-10-CM

## 2023-09-07 MED ORDER — REPATHA SURECLICK 140 MG/ML ~~LOC~~ SOAJ
140.0000 mg | SUBCUTANEOUS | 3 refills | Status: AC
  Filled 2024-07-12: qty 2, 28d supply, fill #0
  Filled 2024-08-16: qty 2, 28d supply, fill #1

## 2023-09-07 NOTE — Telephone Encounter (Signed)
 Call to inform about approval, N/A LVM   Repatha  prescription sent to [facility]. F/u lab ordered it is due on March 31,2025

## 2023-09-07 NOTE — Telephone Encounter (Signed)
 Pharmacy Patient Advocate Encounter   Received notification from Pt Calls Messages that prior authorization for repatha  is required/requested.   Insurance verification completed.   The patient is insured through [employer] .   Per test claim: PA required; PA submitted to above mentioned insurance via CoverMyMeds Key/confirmation #/EOC [id number] Status is pending

## 2023-09-07 NOTE — Addendum Note (Signed)
 Addended by: Susann Lawhorne K on: 09/07/2023 04:43 PM   Modules accepted: Orders

## 2023-09-07 NOTE — Telephone Encounter (Signed)
 Pharmacy Patient Advocate Encounter  Received notification from [employer] that Prior Authorization for repatha  has been APPROVED from 09/07/23 to 09/06/24 $30.00 one month  PA #/Case ID/Reference #: [id number]

## 2023-09-08 ENCOUNTER — Encounter (HOSPITAL_COMMUNITY): Payer: PRIVATE HEALTH INSURANCE

## 2023-09-12 ENCOUNTER — Encounter (HOSPITAL_COMMUNITY)
Admission: RE | Admit: 2023-09-12 | Discharge: 2023-09-12 | Disposition: A | Discharge status: Home / Self Care | Payer: PRIVATE HEALTH INSURANCE | Source: Ambulatory Visit | Attending: Cardiovascular Disease | Admitting: Cardiovascular Disease

## 2023-09-12 DIAGNOSIS — Z955 Presence of coronary angioplasty implant and graft: Secondary | ICD-10-CM

## 2023-09-12 DIAGNOSIS — I213 ST elevation (STEMI) myocardial infarction of unspecified site: Secondary | ICD-10-CM

## 2023-09-12 NOTE — Progress Notes (Signed)
Daily Session Note  Patient Details  Name: Juan Schultz MRN: [id number] Date of Birth: Dec 18, 1955 Referring Provider:   Flowsheet Row CARDIAC REHAB PHASE II ORIENTATION from 06/09/2023 in [facility] CARDIAC REHABIL[facility]  Referring Provider Yates Decamp MD  [facility] Cardiologist: Dr. Cristal Deer McAlhany]       Encounter Date: 09/12/2023  Check In:  Session Check In - 09/12/23 1430       Check-In   Supervising physician immediately available to respond to emergencies See telemetry face sheet for immediately available MD    Location [facility]    Staff Present Ross Ludwig, BS, Exercise Physiologist;Brooke Elwyn Reach, Margarite Gouge, RN, BSN    Virtual Visit No    Medication changes reported     No    Fall or balance concerns reported    No    Tobacco Cessation No Change    Warm-up and Cool-down Performed on first and last piece of equipment    Resistance Training Performed Yes    VAD Patient? No    PAD/SET Patient? No      Pain Assessment   Currently in Pain? No/denies    Pain Score 0-No pain    Multiple Pain Sites No             Capillary Blood Glucose: No results found for this or any previous visit (from the past 24 hours).    Social History   Tobacco Use  Smoking Status Never  Smokeless Tobacco Never    Goals Met:  Independence with exercise equipment Exercise tolerated well No report of concerns or symptoms today Strength training completed today  Goals Unmet:  Not Applicable  Comments: Pt able to follow exercise prescription today without complaint.  Will continue to monitor for progression.

## 2023-09-13 ENCOUNTER — Encounter (HOSPITAL_COMMUNITY): Payer: PRIVATE HEALTH INSURANCE

## 2023-09-14 ENCOUNTER — Encounter (HOSPITAL_COMMUNITY)
Admission: RE | Admit: 2023-09-14 | Discharge: 2023-09-14 | Disposition: A | Discharge status: Home / Self Care | Payer: PRIVATE HEALTH INSURANCE | Source: Ambulatory Visit | Attending: Cardiovascular Disease | Admitting: Cardiovascular Disease

## 2023-09-14 DIAGNOSIS — I213 ST elevation (STEMI) myocardial infarction of unspecified site: Secondary | ICD-10-CM

## 2023-09-14 DIAGNOSIS — Z955 Presence of coronary angioplasty implant and graft: Secondary | ICD-10-CM

## 2023-09-14 NOTE — Progress Notes (Signed)
Daily Session Note  Patient Details  Name: Juan Schultz MRN: [id number] Date of Birth: [birth year] Referring Provider:   Flowsheet Row CARDIAC REHAB PHASE II ORIENTATION from 06/09/2023 in [facility] CARDIAC REHABILITATION  Referring Provider Yates Decamp MD  [facility] Cardiologist: Dr. Cristal Deer McAlhany]       Encounter Date: 09/14/2023  Check In:  Session Check In - 09/14/23 1415       Check-In   Supervising physician immediately available to respond to emergencies See telemetry face sheet for immediately available ER MD    Location [facility]    Staff Present Ross Ludwig, BS, Exercise Physiologist;Brooke Elwyn Reach, Margarite Gouge, RN, BSN    Virtual Visit No    Medication changes reported     No    Fall or balance concerns reported    No    Warm-up and Cool-down Performed on first and last piece of equipment    Resistance Training Performed Yes    VAD Patient? No    PAD/SET Patient? No      Pain Assessment   Currently in Pain? No/denies    Pain Score 0-No pain    Multiple Pain Sites No             Capillary Blood Glucose: No results found for this or any previous visit (from the past 24 hours).    Social History   Tobacco Use  Smoking Status Never  Smokeless Tobacco Never    Goals Met:  Independence with exercise equipment Exercise tolerated well No report of concerns or symptoms today Strength training completed today  Goals Unmet:  Not Applicable  Comments: Pt able to follow exercise prescription today without complaint.  Will continue to monitor for progression.

## 2023-09-15 ENCOUNTER — Encounter (HOSPITAL_COMMUNITY): Payer: PRIVATE HEALTH INSURANCE

## 2023-09-19 ENCOUNTER — Encounter (HOSPITAL_COMMUNITY)
Admission: RE | Admit: 2023-09-19 | Discharge: 2023-09-19 | Disposition: A | Discharge status: Home / Self Care | Payer: PRIVATE HEALTH INSURANCE | Source: Ambulatory Visit | Attending: Cardiovascular Disease | Admitting: Cardiovascular Disease

## 2023-09-19 DIAGNOSIS — Z955 Presence of coronary angioplasty implant and graft: Secondary | ICD-10-CM | POA: Diagnosis not present

## 2023-09-19 DIAGNOSIS — I213 ST elevation (STEMI) myocardial infarction of unspecified site: Secondary | ICD-10-CM

## 2023-09-19 NOTE — Progress Notes (Signed)
Daily Session Note  Patient Details  Name: Juan Schultz MRN: [id number] Date of Birth: [birth year] Referring Provider:   Flowsheet Row CARDIAC REHAB PHASE II ORIENTATION from 06/09/2023 in [facility] CARDIAC REHABILITATION  Referring Provider Yates Decamp MD  [facility] Cardiologist: Dr. Cristal Deer McAlhany]       Encounter Date: 09/19/2023  Check In:  Session Check In - 09/19/23 1430       Check-In   Supervising physician immediately available to respond to emergencies See telemetry face sheet for immediately available MD    Location [facility]    Staff Present Ross Ludwig, BS, Exercise Physiologist;Phyllis Billingsley, RN;Brooke Elwyn Reach, RN    Virtual Visit No    Medication changes reported     No    Fall or balance concerns reported    No    Tobacco Cessation No Change    Warm-up and Cool-down Performed on first and last piece of equipment    Resistance Training Performed Yes    VAD Patient? No    PAD/SET Patient? No      Pain Assessment   Currently in Pain? No/denies    Pain Score 0-No pain    Multiple Pain Sites No             Capillary Blood Glucose: No results found for this or any previous visit (from the past 24 hours).    Social History   Tobacco Use  Smoking Status Never  Smokeless Tobacco Never    Goals Met:  Independence with exercise equipment Exercise tolerated well No report of concerns or symptoms today Strength training completed today  Goals Unmet:  Not Applicable  Comments: Pt able to follow exercise prescription today without complaint.  Will continue to monitor for progression.

## 2023-09-20 ENCOUNTER — Encounter (HOSPITAL_COMMUNITY): Payer: PRIVATE HEALTH INSURANCE

## 2023-09-21 ENCOUNTER — Encounter (HOSPITAL_COMMUNITY): Payer: Self-pay | Admitting: *Deleted

## 2023-09-21 ENCOUNTER — Encounter (HOSPITAL_COMMUNITY): Payer: PRIVATE HEALTH INSURANCE

## 2023-09-21 DIAGNOSIS — I213 ST elevation (STEMI) myocardial infarction of unspecified site: Secondary | ICD-10-CM

## 2023-09-21 DIAGNOSIS — Z955 Presence of coronary angioplasty implant and graft: Secondary | ICD-10-CM

## 2023-09-21 NOTE — Progress Notes (Signed)
Cardiac Individual Treatment Plan  Patient Details  Name: Juan Schultz MRN: [id number] Date of Birth: 05-Aug-1956 Referring Provider:   Flowsheet Row CARDIAC REHAB PHASE II ORIENTATION from 06/09/2023 in [facility] CARDIAC REHABILITATION  Referring Provider Yates Decamp MD  [Primary Cardiologist: Dr. Cristal Deer McAlhany]       Initial Encounter Date:  Flowsheet Row CARDIAC REHAB PHASE II ORIENTATION from 06/09/2023 in [place] CARDIAC REHABILITATION  Date 06/09/23       Visit Diagnosis: ST elevation myocardial infarction (STEMI), unspecified artery [facility])  Status post coronary artery stent placement  Patient's Home Medications on Admission:  Current Outpatient Medications:    albuterol (VENTOLIN HFA) 108 (90 Base) MCG/ACT inhaler, Inhale 2 puffs into the lungs as needed for wheezing or shortness of breath., Disp: , Rfl:    aspirin EC 81 MG tablet, Take 1 tablet (81 mg total) by mouth daily. Swallow whole., Disp: 90 tablet, Rfl: 3   atorvastatin (LIPITOR) 20 MG tablet, Take 1 tablet (20 mg total) by mouth daily., Disp: 90 tablet, Rfl: 3   cetirizine (ZYRTEC) 10 MG tablet, Take 10 mg by mouth daily as needed for allergies., Disp: , Rfl:    Cholecalciferol (D3 PO), Take 1 tablet by mouth daily., Disp: , Rfl:    Evolocumab (REPATHA SURECLICK) 140 MG/ML SOAJ, Inject 140 mg into the skin every 14 (fourteen) days., Disp: 6 mL, Rfl: 3   icosapent Ethyl (VASCEPA) 1 g capsule, Take 2 capsules (2 g total) by mouth 2 (two) times daily., Disp: 120 capsule, Rfl: 6   nitroGLYCERIN (NITROSTAT) 0.4 MG SL tablet, Place 1 tablet (0.4 mg total) under the tongue every 5 (five) minutes as needed for chest pain., Disp: 25 tablet, Rfl: 3   Omega-3 Fatty Acids (OMEGA-3 FISH OIL) 500 MG CAPS, Take 500 mg by mouth daily. Take 2 daily 1000 mg/day, Disp: , Rfl:    prasugrel (EFFIENT) 10 MG TABS tablet, Take 1 tablet (10 mg total) by mouth daily., Disp: 90 tablet, Rfl: 3   ranolazine (RANEXA) 500 MG 12 hr  tablet, Take 1 tablet (500 mg total) by mouth 2 (two) times daily., Disp: 60 tablet, Rfl: 5  Past Medical History: Past Medical History:  Diagnosis Date   Seasonal allergies     Tobacco Use: Social History   Tobacco Use  Smoking Status Never  Smokeless Tobacco Never    Labs: Review Flowsheet       Latest Ref Rng & Units 05/27/2023  Labs for ITP Cardiac and Pulmonary Rehab  Cholestrol 0 - 200 mg/dL 578   LDL (calc) 0 - 99 mg/dL 469   HDL-C >62 mg/dL 58   Trlycerides <952 mg/dL 841   Hemoglobin L2G 4.8 - 5.6 % 5.8     Capillary Blood Glucose: Lab Results  Component Value Date   GLUCAP 150 (H) 05/27/2023     Exercise Target Goals: Exercise Program Goal: Individual exercise prescription set using results from initial 6 min walk test and THRR while considering  patient's activity barriers and safety.   Exercise Prescription Goal: Starting with aerobic activity 30 plus minutes a day, 3 days per week for initial exercise prescription. Provide home exercise prescription and guidelines that participant acknowledges understanding prior to discharge.  Activity Barriers & Risk Stratification:  Activity Barriers & Cardiac Risk Stratification - 06/09/23 1436       Activity Barriers & Cardiac Risk Stratification   Activity Barriers Arthritis;Shortness of Breath;Chest Pain/Angina;Joint Problems   R knee is bone on bone  Cardiac Risk Stratification Moderate             6 Minute Walk:  6 Minute Walk     Row Name 06/09/23 1434         6 Minute Walk   Phase Initial     Distance 1440 feet     Walk Time 6 minutes     # of Rest Breaks 0     MPH 2.72     METS 3.75     RPE 11     Perceived Dyspnea  1     VO2 Peak 13.13     Symptoms Yes (comment)     Comments chest "cloudy" 4/10, SOB     Resting HR 91 bpm     Resting BP 102/64     Resting Oxygen Saturation  96 %     Exercise Oxygen Saturation  during 6 min walk 99 %     Max Ex. HR 108 bpm     Max Ex. BP 134/62      2 Minute Post BP 120/66              Oxygen Initial Assessment:   Oxygen Re-Evaluation:   Oxygen Discharge (Final Oxygen Re-Evaluation):   Initial Exercise Prescription:  Initial Exercise Prescription - 06/09/23 1400       Date of Initial Exercise RX and Referring Provider   Date 06/09/23    Referring Provider Yates Decamp MD   Primary Cardiologist: Dr. Verne Carrow     Oxygen   Maintain Oxygen Saturation 88% or higher      Treadmill   MPH 2.7    Grade 0.5    Minutes 15    METs 3.25      REL-XR   Level 3    Speed 50    Minutes 15    METs 3      Prescription Details   Frequency (times per week) 2    Duration Progress to 30 minutes of continuous aerobic without signs/symptoms of physical distress      Intensity   THRR 40-80% of Max Heartrate 117-142    Ratings of Perceived Exertion 11-13    Perceived Dyspnea 0-4      Progression   Progression Continue to progress workloads to maintain intensity without signs/symptoms of physical distress.      Resistance Training   Training Prescription Yes    Weight 3 lb    Reps 10-15             Perform Capillary Blood Glucose checks as needed.  Exercise Prescription Changes:   Exercise Prescription Changes     Row Name 06/09/23 1400 07/11/23 1400 08/22/23 1500 09/05/23 1500 09/19/23 1500     Response to Exercise   Blood Pressure (Admit) 102/64 122/64 110/58 132/70 118/68   Blood Pressure (Exercise) 134/62 124/62 -- -- --   Blood Pressure (Exit) 120/66 126/72 118/62 100/62 110/70   Heart Rate (Admit) 91 bpm 2.03 bpm 93 bpm 96 bpm 88 bpm   Heart Rate (Exercise) 108 bpm 92 bpm 133 bpm 126 bpm 139 bpm   Heart Rate (Exit) 90 bpm 119 bpm 105 bpm 96 bpm 113 bpm   Oxygen Saturation (Admit) 96 % 97 % -- -- --   Oxygen Saturation (Exercise) 99 % -- -- -- --   Rating of Perceived Exertion (Exercise) 11 13 14 15 15    Perceived Dyspnea (Exercise) 1 -- -- -- --   Symptoms chest "cloudy", SOB -- -- -- --  Comments walk test restuls -- -- -- --   Duration -- Continue with 30 min of aerobic exercise without signs/symptoms of physical distress. Continue with 30 min of aerobic exercise without signs/symptoms of physical distress. Continue with 30 min of aerobic exercise without signs/symptoms of physical distress. Continue with 30 min of aerobic exercise without signs/symptoms of physical distress.   Intensity -- THRR unchanged THRR unchanged THRR unchanged THRR unchanged     Progression   Progression -- Continue to progress workloads to maintain intensity without signs/symptoms of physical distress. Continue to progress workloads to maintain intensity without signs/symptoms of physical distress. Continue to progress workloads to maintain intensity without signs/symptoms of physical distress. Continue to progress workloads to maintain intensity without signs/symptoms of physical distress.     Resistance Training   Training Prescription -- Yes Yes Yes Yes   Weight -- 4 4 l;bs 5 lbs 5   Reps -- 10-15 10-15 10-15 10-15     Treadmill   MPH -- 1.8 3 3  3.2   Grade -- 0.5 2 2  2.5   Minutes -- 15 15 15 15    METs -- 2.5 4.12 4.12 4.55     REL-XR   Level -- 3 3 5 6    Speed -- 54 60 53 49   Minutes -- 15 15 15 15    METs -- 4 4.5 4.2 3.9     Oxygen   Maintain Oxygen Saturation -- 88% or higher 88% or higher 88% or higher 88% or higher            Exercise Comments:   Exercise Comments     Row Name 07/11/23 0756           Exercise Comments First full day of exercise!  Patient was oriented to gym and equipment including functions, settings, policies, and procedures.  Patient's individual exercise prescription and treatment plan were reviewed.  All starting workloads were established based on the results of the 6 minute walk test done at initial orientation visit.  The plan for exercise progression was also introduced and progression will be customized based on patient's performance and goals.                 Exercise Goals and Review:   Exercise Goals     Row Name 06/09/23 1441             Exercise Goals   Increase Physical Activity Yes       Intervention Provide advice, education, support and counseling about physical activity/exercise needs.;Develop an individualized exercise prescription for aerobic and resistive training based on initial evaluation findings, risk stratification, comorbidities and participant's personal goals.       Expected Outcomes Short Term: Attend rehab on a regular basis to increase amount of physical activity.;Long Term: Add in home exercise to make exercise part of routine and to increase amount of physical activity.;Long Term: Exercising regularly at least 3-5 days a week.       Increase Strength and Stamina Yes       Intervention Provide advice, education, support and counseling about physical activity/exercise needs.;Develop an individualized exercise prescription for aerobic and resistive training based on initial evaluation findings, risk stratification, comorbidities and participant's personal goals.       Expected Outcomes Short Term: Increase workloads from initial exercise prescription for resistance, speed, and METs.;Short Term: Perform resistance training exercises routinely during rehab and add in resistance training at home;Long Term: Improve cardiorespiratory fitness, muscular endurance and strength as measured  by increased METs and functional capacity ( )       Able to understand and use rate of perceived exertion (RPE) scale Yes       Intervention Provide education and explanation on how to use RPE scale       Expected Outcomes Short Term: Able to use RPE daily in rehab to express subjective intensity level;Long Term:  Able to use RPE to guide intensity level when exercising independently       Able to understand and use Dyspnea scale Yes       Intervention Provide education and explanation on how to use Dyspnea scale        Expected Outcomes Short Term: Able to use Dyspnea scale daily in rehab to express subjective sense of shortness of breath during exertion;Long Term: Able to use Dyspnea scale to guide intensity level when exercising independently       Knowledge and understanding of Target Heart Rate Range (THRR) Yes       Intervention Provide education and explanation of THRR including how the numbers were predicted and where they are located for reference       Expected Outcomes Short Term: Able to state/look up THRR;Long Term: Able to use THRR to govern intensity when exercising independently;Short Term: Able to use daily as guideline for intensity in rehab       Able to check pulse independently Yes       Intervention Provide education and demonstration on how to check pulse in carotid and radial arteries.;Review the importance of being able to check your own pulse for safety during independent exercise       Expected Outcomes Short Term: Able to explain why pulse checking is important during independent exercise;Long Term: Able to check pulse independently and accurately       Understanding of Exercise Prescription Yes       Intervention Provide education, explanation, and written materials on patient's individual exercise prescription       Expected Outcomes Short Term: Able to explain program exercise prescription;Long Term: Able to explain home exercise prescription to exercise independently                Exercise Goals Re-Evaluation :  Exercise Goals Re-Evaluation     Row Name 07/11/23 0756 07/11/23 1431 07/18/23 0805 08/08/23 1450 08/29/23 1428     Exercise Goal Re-Evaluation   Exercise Goals Review Able to understand and use rate of perceived exertion (RPE) scale;Able to understand and use Dyspnea scale;Knowledge and understanding of Target Heart Rate Range (THRR);Understanding of Exercise Prescription Increase Physical Activity;Increase Strength and Stamina;Understanding of Exercise Prescription  Increase Physical Activity;Increase Strength and Stamina;Able to understand and use rate of perceived exertion (RPE) scale;Able to understand and use Dyspnea scale;Knowledge and understanding of Target Heart Rate Range (THRR);Able to check pulse independently;Understanding of Exercise Prescription Increase Physical Activity;Increase Strength and Stamina;Understanding of Exercise Prescription Increase Physical Activity;Increase Strength and Stamina;Understanding of Exercise Prescription   Comments Reviewed RPE and dyspnea scale, THR and program prescription with pt today.  Pt voiced understanding and was given a copy of goals to take home. Juan Schultz has his first day of exercise. He did good with exercise, he needs to get use to the treadmill before increasing. He ids at level 3 on the XR with a MEt of 4.0. Will continue to monitor and progreess as able. Juan Schultz is off to a good start in rehab.  He noted he was very tired and washed out after church yesterday.  Reviewed home exercise with pt today.  Pt plans to walk and use bike and staff videos at home for exercise.  Reviewed THR, pulse, RPE, sign and symptoms, pulse oximetery and when to call 911 or MD.  Also discussed weather considerations and indoor options.  Pt voiced understanding. Juan Schultz continues to do well inn rehab. He is increasing his speeds on the treadmill and level on the XR. He is continuing to feel his fatigue by the end of the week since starting baclk work. He is hoping by exercising his endutrace will increase. Juan Schultz continues to do well in rehab. He did state that some days he feel very tried but it just comes and goes. He is increase his level in rehab and is walking the mall on off days since it is cold outside.   Expected Outcomes Short: Use RPE daily to regulate intensity.  Long: Follow program prescription in THR. Short: continue to get use to the treadmill   long termL continue to attend rehab Short: Continue to add in exercise at home Long: Continue  to build up stamina. Short: Continue to add in exercise at home Long: Continue to build up stamina. Short: Continue to add in exercise at home Long: Continue to build up stamina.    Row Name 09/09/23 1354             Exercise Goal Re-Evaluation   Exercise Goals Review Increase Physical Activity;Increase Strength and Stamina;Understanding of Exercise Prescription       Comments Juan Schultz is doing well at rehab and is tolerating exercise well. He is increasing his levels on both sets of equipment each week. His is now on speed 3 with a grade of 2 for the treadmill and on levele 3 for the XR. Will continue to monitor and progress as able.       Expected Outcomes Continue to atend rehab                 Discharge Exercise Prescription (Final Exercise Prescription Changes):  Exercise Prescription Changes - 09/19/23 1500       Response to Exercise   Blood Pressure (Admit) 118/68    Blood Pressure (Exit) 110/70    Heart Rate (Admit) 88 bpm    Heart Rate (Exercise) 139 bpm    Heart Rate (Exit) 113 bpm    Rating of Perceived Exertion (Exercise) 15    Duration Continue with 30 min of aerobic exercise without signs/symptoms of physical distress.    Intensity THRR unchanged      Progression   Progression Continue to progress workloads to maintain intensity without signs/symptoms of physical distress.      Resistance Training   Training Prescription Yes    Weight 5    Reps 10-15      Treadmill   MPH 3.2    Grade 2.5    Minutes 15    METs 4.55      REL-XR   Level 6    Speed 49    Minutes 15    METs 3.9      Oxygen   Maintain Oxygen Saturation 88% or higher             Nutrition:  Target Goals: Understanding of nutrition guidelines, daily intake of sodium 1500mg , cholesterol 200mg , calories 30% from fat and 7% or less from saturated fats, daily to have 5 or more servings of fruits and vegetables.  Biometrics:  Pre Biometrics - 06/09/23 1441  Pre Biometrics    Height 5' 6.5" (1.689 m)    Weight 146 lb 6.4 oz (66.4 kg)    Waist Circumference 32 inches    Hip Circumference 33.5 inches    Waist to Hip Ratio 0.96 %    BMI (Calculated) 23.28    Grip Strength 34.5 kg    Single Leg Stand 16.5 seconds              Nutrition Therapy Plan and Nutrition Goals:   Nutrition Assessments:  MEDIFICTS Score Key: >=70 Need to make dietary changes  40-70 Heart Healthy Diet <= 40 Therapeutic Level Cholesterol Diet  Flowsheet Row CARDIAC VIRTUAL BASED CARE from 06/08/2023 in [facility] CARDIAC REHABILITATION  Picture Your Plate Total Score on Admission 39      Picture Your Plate Scores: <60 Unhealthy dietary pattern with much room for improvement. 41-50 Dietary pattern unlikely to meet recommendations for good health and room for improvement. 51-60 More healthful dietary pattern, with some room for improvement.  >60 Healthy dietary pattern, although there may be some specific behaviors that could be improved.    Nutrition Goals Re-Evaluation:  Nutrition Goals Re-Evaluation     Row Name 07/18/23 4540 08/08/23 1503 08/29/23 1432         Goals   Nutrition Goal Heart Healthy Diet Heart Healthy Diet Heart Healthy Diet     Comment Juan Schultz is doing well in rehab.  He has been following heart healthy diet since January.  He has been watching his sugar.  He has stayed away from desserts.  He tries to avoid processed foods and focuses on low carb diet.  He has been trying to get a enough protein.  He has been eating canned fish to get the omega 3s.  He has been trying to stick to vegetable oils versus seed oils. Juan Schultz is doing well with his diet. He continues to follow a healty diet sicne january. He continues to watch his sugars and his carbs. He does not eat a lot of bread. Juan Schultz contninues to do well with his diet. He has been following a healthy diet for year with watching what he is eating and his carbs. He did slack a little during the holidays with sweets  and sour dough bread but is getting back to his healthy choices.     Expected Outcome Short: Check salt content in canned fish Long: Continue to watch fat and sugar Short: Check salt content in canned fish Long: Continue to watch fat and sugar Short: Continue to watch food options  Long: Continue to watch fat and sugar              Nutrition Goals Discharge (Final Nutrition Goals Re-Evaluation):  Nutrition Goals Re-Evaluation - 08/29/23 1432       Goals   Nutrition Goal Heart Healthy Diet    Comment Juan Schultz contninues to do well with his diet. He has been following a healthy diet for year with watching what he is eating and his carbs. He did slack a little during the holidays with sweets and sour dough bread but is getting back to his healthy choices.    Expected Outcome Short: Continue to watch food options  Long: Continue to watch fat and sugar             Psychosocial: Target Goals: Acknowledge presence or absence of significant depression and/or stress, maximize coping skills, provide positive support system. Participant is able to verbalize types and ability to use techniques  and skills needed for reducing stress and depression.  Initial Review & Psychosocial Screening:  Initial Psych Review & Screening - 06/08/23 1343       Initial Review   Current issues with None Identified      Family Dynamics   Good Support System? Yes      Barriers   Psychosocial barriers to participate in program There are no identifiable barriers or psychosocial needs.;The patient should benefit from training in stress management and relaxation.      Screening Interventions   Interventions To provide support and resources with identified psychosocial needs;Encouraged to exercise;Provide feedback about the scores to participant    Expected Outcomes Short Term goal: Utilizing psychosocial counselor, staff and physician to assist with identification of specific Stressors or current issues interfering  with healing process. Setting desired goal for each stressor or current issue identified.;Long Term Goal: Stressors or current issues are controlled or eliminated.;Short Term goal: Identification and review with participant of any Quality of Life or Depression concerns found by scoring the questionnaire.;Long Term goal: The participant improves quality of Life and PHQ9 Scores as seen by post scores and/or verbalization of changes             Quality of Life Scores:  Quality of Life - 06/08/23 1342       Quality of Life   Select Quality of Life      Quality of Life Scores   Health/Function Pre 23.67 %    Socioeconomic Pre 22.81 %    Psych/Spiritual Pre 26.93 %    Family Pre 30 %    GLOBAL Pre 25.03 %            Scores of 19 and below usually indicate a poorer quality of life in these areas.  A difference of  2-3 points is a clinically meaningful difference.  A difference of 2-3 points in the total score of the Quality of Life Index has been associated with significant improvement in overall quality of life, self-image, physical symptoms, and general health in studies assessing change in quality of life.  PHQ-9: Review Flowsheet       07/11/2023  Depression screen PHQ 2/9  Decreased Interest 0  Down, Depressed, Hopeless 0  PHQ - 2 Score 0  Altered sleeping 1  Tired, decreased energy 1  Change in appetite 0  Feeling bad or failure about yourself  0  Trouble concentrating 1  Moving slowly or fidgety/restless 0  Suicidal thoughts 0  PHQ-9 Score 3  Difficult doing work/chores Somewhat difficult   Interpretation of Total Score  Total Score Depression Severity:  1-4 = Minimal depression, 5-9 = Mild depression, 10-14 = Moderate depression, 15-19 = Moderately severe depression, 20-27 = Severe depression   Psychosocial Evaluation and Intervention:  Psychosocial Evaluation - 06/08/23 1344       Psychosocial Evaluation & Interventions   Interventions Stress management  education;Relaxation education;Encouraged to exercise with the program and follow exercise prescription    Comments Patient was referred to [facility] with STEMI/Stent placement 05/27/23. He has been having some issues with hypotension since his discharge with some light-headedness and general malaise. His losartan was discontinued with improved symptoms and blood pressure. 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 He has not returned to work but plans to return once released from his cardiologist. He lives with his wife who is his main support person. His son and daughter-in-law also support him. He denies any depression, anxiety or  major stressors in his life. He says he sleeps well most of the time. He usually gets 6 hours/night which he says is good for him. He is motivated to start the program to improve his health overall. He also wants to improve his stamina; feel better overall and get back to normal. He also enjoys hiking and would like to be able to do this again. He has no barriers identified to participate in the program.    Expected Outcomes Short Term: Start the program and attend consistently. Long Term: Meet his personal goals.    Continue Psychosocial Services  Follow up required by staff             Psychosocial Re-Evaluation:  Psychosocial Re-Evaluation     Row Name 07/18/23 0807 08/08/23 1504 08/29/23 1436         Psychosocial Re-Evaluation   Current issues with Current Stress Concerns Current Stress Concerns None Identified     Comments Juan Schultz returned to work last week.  He was worn out after about 5 hours at work and went to see his doctor about it last week.  Yesterday after church he took a nap and didn't feel like going to parade in afternoon.  His attendees were happy to see him back at work.  He has been sleeping fairly well and feeling good in morning.  He did have a hard time getting to sleep last week.  He also just had  his mom passed last week.  He has had a tough week overall.  He is grieving but not feeling depressed. Juan Schultz has been doing well in rehab. He continues to be tired by the end of the week. He is hoping with his endurance building in rehab his energy will increase. I told him to contact his doctor about fatigue if he sees no improvement. He has stopped his crestor this weekend due to having muscle spasms and stomach ache. I told him to call and imform his doctor about the med change. Juan Schultz stated that he has not had any more stress since his brother got out of the hospital during the holidays. HE is sleeping well during the night. He did state that he gets tired evey now and then.     Expected Outcomes Short: Continue to cope with losing his mom Long: Continue to adjust returning to work Short: call PCP and give updates about fatigue and stopping his crestor   long : continues to adjust to returing to work and exericse Short: let PCP about fatigue  Long: contiue ti exercise for happiness     Interventions Encouraged to attend Cardiac Rehabilitation for the exercise Encouraged to attend Cardiac Rehabilitation for the exercise Encouraged to attend Cardiac Rehabilitation for the exercise     Continue Psychosocial Services  Follow up required by staff Follow up required by staff Follow up required by staff              Psychosocial Discharge (Final Psychosocial Re-Evaluation):  Psychosocial Re-Evaluation - 08/29/23 1436       Psychosocial Re-Evaluation   Current issues with None Identified    Comments Juan Schultz stated that he has not had any more stress since his brother got out of the hospital during the holidays. HE is sleeping well during the night. He did state that he gets tired evey now and then.    Expected Outcomes Short: let PCP about fatigue  Long: contiue ti exercise for happiness    Interventions Encouraged to attend Cardiac  Rehabilitation for the exercise    Continue Psychosocial Services  Follow up  required by staff             Vocational Rehabilitation: Provide vocational rehab assistance to qualifying candidates.   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             Education: Education Goals: Education classes will be provided on a weekly basis, covering required topics. Participant will state understanding/return demonstration of topics presented.  Learning Barriers/Preferences:  Learning Barriers/Preferences - 06/08/23 1339       Learning Barriers/Preferences   Learning Barriers None    Learning Preferences Audio;Skilled Demonstration             Education Topics: Hypertension, Hypertension Reduction -Define heart disease and high blood pressure. Discus how high blood pressure affects the body and ways to reduce high blood pressure.   Exercise and Your Heart -Discuss why it is important to exercise, the FITT principles of exercise, normal and abnormal responses to exercise, and how to exercise safely. Flowsheet Row CARDIAC REHAB PHASE II EXERCISE from 09/14/2023 in [place] CARDIAC REHABILITATION  Date 08/03/23  Educator hb  Instruction Review Code 1- Verbalizes Understanding       Angina -Discuss definition of angina, causes of angina, treatment of angina, and how to decrease risk of having angina.   Cardiac Medications -Review what the following cardiac medications are used for, how they affect the body, and side effects that may occur when taking the medications.  Medications include Aspirin, Beta blockers, calcium channel blockers, ACE Inhibitors, angiotensin receptor blockers, diuretics, digoxin, and antihyperlipidemics. Flowsheet Row CARDIAC REHAB PHASE II EXERCISE from  09/14/2023 in [place] CARDIAC REHABILITATION  Date 09/14/23  Educator DJ  Instruction Review Code 1- Verbalizes Understanding       Congestive Heart Failure -Discuss the definition of CHF, how to live with CHF, the signs and symptoms of CHF, and how keep track of weight and sodium intake.   Heart Disease and Intimacy -Discus the effect sexual activity has on the heart, how changes occur during intimacy as we age, and safety during sexual activity.   Smoking Cessation / COPD -Discuss different methods to quit smoking, the health benefits of quitting smoking, and the definition of COPD.   Nutrition I: Fats -Discuss the types of cholesterol, what cholesterol does to the heart, and how cholesterol levels can be controlled. Flowsheet Row CARDIAC REHAB PHASE II EXERCISE from 09/14/2023 in [place] CARDIAC REHABILITATION  Date 08/10/23  Educator jh  Instruction Review Code 1- Verbalizes Understanding       Nutrition II: Labels -Discuss the different components of food labels and how to read food label   Heart Parts/Heart Disease and PAD -Discuss the anatomy of the heart, the pathway of blood circulation through the heart, and these are affected by heart disease. Flowsheet Row CARDIAC REHAB PHASE II EXERCISE from 09/14/2023 in [place] CARDIAC REHABILITATION  Date 07/13/23  Educator hb  Instruction Review Code 1- Verbalizes Understanding       Stress I: Signs and Symptoms -Discuss the causes of stress, how stress may lead to anxiety and depression, and ways to limit stress.   Stress II: Relaxation -Discuss different  types of relaxation techniques to limit stress.   Warning Signs of Stroke / TIA -Discuss definition of a stroke, what the signs and symptoms are of a stroke, and how to identify when someone is having stroke.   Knowledge Questionnaire Score:  Knowledge Questionnaire Score - 06/08/23 1342       Knowledge Questionnaire Score   Pre Score 22/26              Core Components/Risk Factors/Patient Goals at Admission:  Personal Goals and Risk Factors at Admission - 06/09/23 1443       Core Components/Risk Factors/Patient Goals on Admission    Weight Management Weight Maintenance;Yes    Intervention Weight Management: Develop a combined nutrition and exercise program designed to reach desired caloric intake, while maintaining appropriate intake of nutrient and fiber, sodium and fats, and appropriate energy expenditure required for the weight goal.;Weight Management: Provide education and appropriate resources to help participant work on and attain dietary goals.    Admit Weight 146 lb 6.4 oz (66.4 kg)    Goal Weight: Short Term 146 lb (66.2 kg)    Goal Weight: Long Term 146 lb (66.2 kg)    Expected Outcomes Short Term: Continue to assess and modify interventions until short term weight is achieved;Long Term: Adherence to nutrition and physical activity/exercise program aimed toward attainment of established weight goal;Weight Maintenance: Understanding of the daily nutrition guidelines, which includes 25-35% calories from fat, 7% or less cal from saturated fats, less than 200mg  cholesterol, less than 1.5gm of sodium, & 5 or more servings of fruits and vegetables daily    Improve shortness of breath with ADL's Yes    Intervention Provide education, individualized exercise plan and daily activity instruction to help decrease symptoms of SOB with activities of daily living.    Expected Outcomes Short Term: Improve cardiorespiratory fitness to achieve a reduction of symptoms when performing ADLs;Long Term: Be able to perform more ADLs without symptoms or delay the onset of symptoms    Lipids Yes    Intervention Provide education and support for participant on nutrition & aerobic/resistive exercise along with prescribed medications to achieve LDL 70mg , HDL >40mg .    Expected Outcomes Short Term: Participant states understanding of desired  cholesterol values and is compliant with medications prescribed. Participant is following exercise prescription and nutrition guidelines.;Long Term: Cholesterol controlled with medications as prescribed, with individualized exercise RX and with personalized nutrition plan. Value goals: LDL < 70mg , HDL > 40 mg.             Core Components/Risk Factors/Patient Goals Review:   Goals and Risk Factor Review     Row Name 07/18/23 0816 08/08/23 1454 08/29/23 1438         Core Components/Risk Factors/Patient Goals Review   Personal Goals Review Weight Management/Obesity;Lipids;Improve shortness of breath with ADL's Weight Management/Obesity;Lipids;Improve shortness of breath with ADL's Weight Management/Obesity;Lipids;Improve shortness of breath with ADL's     Review Juan Schultz is doing well in rehab.  His weight is holding steady around 142 lb.  He is watching his cholesterol levels and trying to get a lot of omega 3.  His pressures are doing well.  He had been doing well, but yesterday had the "band" feeling around his chest after church and was really tired too.  His breathing will come and go with the chest band feeling.  Overally, he was feeling better other than yesterday. He did have a hard week with returning to work and losing his mom. Patcontinue  to do well in rehab. His blood pressure is staying WNL and been good at rehab and home. His weight is staying between 142-147lbs and he is happy with that. Juan Schultz continues to do well in rehab. His blood pressure is staying WNL and been good at rehab He has not been checking at home so he will starte. His weight is staying between 142-147lbs and he is happy with that.     Expected Outcomes Short: Continue to try to pin [place] band feeling Long: Continue to improve breathing Short: contiunue to montior risk factors   long term: continue to exercise for overall wellbeing Short: contiunue to montior risk factors   long term: continue to exercise for overall wellbeing               Core Components/Risk Factors/Patient Goals at Discharge (Final Review):   Goals and Risk Factor Review - 08/29/23 1438       Core Components/Risk Factors/Patient Goals Review   Personal Goals Review Weight Management/Obesity;Lipids;Improve shortness of breath with ADL's    Review Juan Schultz continues to do well in rehab. His blood pressure is staying WNL and been good at rehab He has not been checking at home so he will starte. His weight is staying between 142-147lbs and he is happy with that.    Expected Outcomes Short: contiunue to montior risk factors   long term: continue to exercise for overall wellbeing             ITP Comments:  ITP Comments     Row Name 06/09/23 1419 06/29/23 0826 07/11/23 0755 07/27/23 0806 08/23/23 1002   ITP Comments Patient attend orientation today.  Patient is attendingCardiac Rehabilitation Program.  Documentation for diagnosis can be found in [facility] 05/27/23.  Reviewed medical chart, RPE/RPD, gym safety, and program guidelines.  Patient was fitted to equipment they will be using during rehab.  Patient is scheduled to start exercise on Tuesday 06/14/23 at 1500.   Initial ITP created and sent for review and signature by Dr. Dina Rich, Medical Director for Cardiac Rehabilitation Program. 30 day review completed. ITP sent to Dr. Dina Rich, Medical Director of Cardiac Rehab. Continue with ITP unless changes are made by physician.  Program start was help for chest pain by MD. First full day of exercise!  Patient was oriented to gym and equipment including functions, settings, policies, and procedures.  Patient's individual exercise prescription and treatment plan were reviewed.  All starting workloads were established based on the results of the 6 minute walk test done at initial orientation visit.  The plan for exercise progression was also introduced and progression will be customized based on patient's performance and goals. 30 day review  completed. ITP sent to Dr. Dina Rich, Medical Director of Cardiac Rehab. Continue with ITP unless changes are made by physician.  Newer to program but has also been traveling. 30 day review completed. ITP sent to Dr. Dina Rich, Medical Director of Cardiac Rehab. Continue with ITP unless changes are made by physician.    Row Name 09/21/23 1114           ITP Comments 30 day review completed. ITP sent to Dr. Dina Rich, Medical Director of Cardiac Rehab. Continue with ITP unless changes are made by physician.                Comments: 30 day review

## 2023-09-22 ENCOUNTER — Encounter (HOSPITAL_COMMUNITY): Payer: PRIVATE HEALTH INSURANCE

## 2023-09-26 ENCOUNTER — Encounter (HOSPITAL_COMMUNITY)
Admission: RE | Admit: 2023-09-26 | Discharge: 2023-09-26 | Disposition: A | Discharge status: Home / Self Care | Payer: PRIVATE HEALTH INSURANCE | Source: Ambulatory Visit | Attending: Cardiovascular Disease | Admitting: Cardiovascular Disease

## 2023-09-26 DIAGNOSIS — Z955 Presence of coronary angioplasty implant and graft: Secondary | ICD-10-CM | POA: Diagnosis present

## 2023-09-26 DIAGNOSIS — I213 ST elevation (STEMI) myocardial infarction of unspecified site: Secondary | ICD-10-CM | POA: Diagnosis present

## 2023-09-26 NOTE — Progress Notes (Signed)
Daily Session Note  Patient Details  Name: Juan Schultz MRN: [id number] Date of Birth: 09/25/1955 Referring Provider:   Flowsheet Row CARDIAC REHAB PHASE II ORIENTATION from 06/09/2023 in [facility] CARDIAC REHABILITATION  Referring Provider Yates Decamp MD  [facility] Cardiologist: Dr. Cristal Deer McAlhany]       Encounter Date: 09/26/2023  Check In:  Session Check In - 09/26/23 1430       Check-In   Supervising physician immediately available to respond to emergencies See telemetry face sheet for immediately available MD    Location [facility]    Staff Present Erskine Speed, RN;Jessica [place], MA, RCEP, CCRP, CCET;Laureen Bancroft, BS, RRT, CPFT;Debra Laural Benes, RN, BSN    Virtual Visit No    Medication changes reported     No    Fall or balance concerns reported    No    Warm-up and Cool-down Performed on first and last piece of equipment    Resistance Training Performed Yes    VAD Patient? No    PAD/SET Patient? No      Pain Assessment   Currently in Pain? No/denies    Pain Score 0-No pain    Multiple Pain Sites No             Capillary Blood Glucose: No results found for this or any previous visit (from the past 24 hours).    Social History   Tobacco Use  Smoking Status Never  Smokeless Tobacco Never    Goals Met:  Independence with exercise equipment Exercise tolerated well No report of concerns or symptoms today Strength training completed today  Goals Unmet:  Not Applicable  Comments: Pt able to follow exercise prescription today without complaint.  Will continue to monitor for progression.

## 2023-09-27 ENCOUNTER — Encounter (HOSPITAL_COMMUNITY): Payer: 59

## 2023-09-28 ENCOUNTER — Encounter (HOSPITAL_COMMUNITY): Payer: PRIVATE HEALTH INSURANCE

## 2023-09-29 ENCOUNTER — Encounter (HOSPITAL_COMMUNITY): Payer: 59

## 2023-09-29 ENCOUNTER — Ambulatory Visit: Payer: PRIVATE HEALTH INSURANCE | Admitting: Cardiovascular Disease

## 2023-09-30 ENCOUNTER — Encounter: Payer: Self-pay | Admitting: Pharmacist

## 2023-09-30 NOTE — Telephone Encounter (Signed)
 Error

## 2023-10-03 ENCOUNTER — Encounter (HOSPITAL_COMMUNITY): Payer: PRIVATE HEALTH INSURANCE

## 2023-10-04 ENCOUNTER — Encounter (HOSPITAL_COMMUNITY): Payer: 59

## 2023-10-04 ENCOUNTER — Encounter (HOSPITAL_COMMUNITY): Payer: Self-pay | Admitting: *Deleted

## 2023-10-04 DIAGNOSIS — I213 ST elevation (STEMI) myocardial infarction of unspecified site: Secondary | ICD-10-CM

## 2023-10-04 DIAGNOSIS — Z955 Presence of coronary angioplasty implant and graft: Secondary | ICD-10-CM

## 2023-10-04 NOTE — Progress Notes (Signed)
Cardiac Individual Treatment Plan  Patient Details  Name: Juan Schultz MRN: [id number] Date of Birth: 1956-02-25 Referring Provider:   Flowsheet Row CARDIAC REHAB PHASE II ORIENTATION from 06/09/2023 in [facility] CARDIAC REHABILITATION  Referring Provider Yates Decamp MD  [Primary Cardiologist: Dr. Cristal Deer McAlhany]       Initial Encounter Date:  Flowsheet Row CARDIAC REHAB PHASE II ORIENTATION from 06/09/2023 in [address] [place] CARDIAC REHABILITATION  Date 06/09/23       Visit Diagnosis: ST elevation myocardial infarction (STEMI), unspecified artery [facility])  Status post coronary artery stent placement  Patient's Home Medications on Admission:  Current Outpatient Medications:    albuterol (VENTOLIN HFA) 108 (90 Base) MCG/ACT inhaler, Inhale 2 puffs into the lungs as needed for wheezing or shortness of breath., Disp: , Rfl:    aspirin EC 81 MG tablet, Take 1 tablet (81 mg total) by mouth daily. Swallow whole., Disp: 90 tablet, Rfl: 3   atorvastatin (LIPITOR) 20 MG tablet, Take 1 tablet (20 mg total) by mouth daily., Disp: 90 tablet, Rfl: 3   cetirizine (ZYRTEC) 10 MG tablet, Take 10 mg by mouth daily as needed for allergies., Disp: , Rfl:    Cholecalciferol (D3 PO), Take 1 tablet by mouth daily., Disp: , Rfl:    Evolocumab (REPATHA SURECLICK) 140 MG/ML SOAJ, Inject 140 mg into the skin every 14 (fourteen) days., Disp: 6 mL, Rfl: 3   icosapent Ethyl (VASCEPA) 1 g capsule, Take 2 capsules (2 g total) by mouth 2 (two) times daily., Disp: 120 capsule, Rfl: 6   nitroGLYCERIN (NITROSTAT) 0.4 MG SL tablet, Place 1 tablet (0.4 mg total) under the tongue every 5 (five) minutes as needed for chest pain., Disp: 25 tablet, Rfl: 3   Omega-3 Fatty Acids (OMEGA-3 FISH OIL) 500 MG CAPS, Take 500 mg by mouth daily. Take 2 daily 1000 mg/day, Disp: , Rfl:    prasugrel (EFFIENT) 10 MG TABS tablet, Take 1 tablet (10 mg total) by mouth daily., Disp: 90 tablet, Rfl: 3   ranolazine (RANEXA) 500 MG 12 hr  tablet, Take 1 tablet (500 mg total) by mouth 2 (two) times daily., Disp: 60 tablet, Rfl: 5  Past Medical History: Past Medical History:  Diagnosis Date   Seasonal allergies     Tobacco Use: Social History   Tobacco Use  Smoking Status Never  Smokeless Tobacco Never    Labs: Review Flowsheet       Latest Ref Rng & Units 05/27/2023  Labs for ITP Cardiac and Pulmonary Rehab  Cholestrol 0 - 200 mg/dL 409   LDL (calc) 0 - 99 mg/dL 811   HDL-C >91 mg/dL 58   Trlycerides <478 mg/dL 295   Hemoglobin A2Z 4.8 - 5.6 % 5.8     Capillary Blood Glucose: Lab Results  Component Value Date   GLUCAP 150 (H) 05/27/2023     Exercise Target Goals: Exercise Program Goal: Individual exercise prescription set using results from initial 6 min walk test and THRR while considering  patient's activity barriers and safety.   Exercise Prescription Goal: Starting with aerobic activity 30 plus minutes a day, 3 days per week for initial exercise prescription. Provide home exercise prescription and guidelines that participant acknowledges understanding prior to discharge.  Activity Barriers & Risk Stratification:  Activity Barriers & Cardiac Risk Stratification - 06/09/23 1436       Activity Barriers & Cardiac Risk Stratification   Activity Barriers Arthritis;Shortness of Breath;Chest Pain/Angina;Joint Problems   R knee is bone on bone  Cardiac Risk Stratification Moderate             6 Minute Walk:  6 Minute Walk     Row Name 06/09/23 1434         6 Minute Walk   Phase Initial     Distance 1440 feet     Walk Time 6 minutes     # of Rest Breaks 0     MPH 2.72     METS 3.75     RPE 11     Perceived Dyspnea  1     VO2 Peak 13.13     Symptoms Yes (comment)     Comments chest "cloudy" 4/10, SOB     Resting HR 91 bpm     Resting BP 102/64     Resting Oxygen Saturation  96 %     Exercise Oxygen Saturation  during 6 min walk 99 %     Max Ex. HR 108 bpm     Max Ex. BP 134/62      2 Minute Post BP 120/66              Oxygen Initial Assessment:   Oxygen Re-Evaluation:   Oxygen Discharge (Final Oxygen Re-Evaluation):   Initial Exercise Prescription:  Initial Exercise Prescription - 06/09/23 1400       Date of Initial Exercise RX and Referring Provider   Date 06/09/23    Referring Provider Yates Decamp MD   Primary Cardiologist: Dr. Verne Carrow     Oxygen   Maintain Oxygen Saturation 88% or higher      Treadmill   MPH 2.7    Grade 0.5    Minutes 15    METs 3.25      REL-XR   Level 3    Speed 50    Minutes 15    METs 3      Prescription Details   Frequency (times per week) 2    Duration Progress to 30 minutes of continuous aerobic without signs/symptoms of physical distress      Intensity   THRR 40-80% of Max Heartrate 117-142    Ratings of Perceived Exertion 11-13    Perceived Dyspnea 0-4      Progression   Progression Continue to progress workloads to maintain intensity without signs/symptoms of physical distress.      Resistance Training   Training Prescription Yes    Weight 3 lb    Reps 10-15             Perform Capillary Blood Glucose checks as needed.  Exercise Prescription Changes:   Exercise Prescription Changes     Row Name 06/09/23 1400 07/11/23 1400 08/22/23 1500 09/05/23 1500 09/19/23 1500     Response to Exercise   Blood Pressure (Admit) 102/64 122/64 110/58 132/70 118/68   Blood Pressure (Exercise) 134/62 124/62 -- -- --   Blood Pressure (Exit) 120/66 126/72 118/62 100/62 110/70   Heart Rate (Admit) 91 bpm 2.03 bpm 93 bpm 96 bpm 88 bpm   Heart Rate (Exercise) 108 bpm 92 bpm 133 bpm 126 bpm 139 bpm   Heart Rate (Exit) 90 bpm 119 bpm 105 bpm 96 bpm 113 bpm   Oxygen Saturation (Admit) 96 % 97 % -- -- --   Oxygen Saturation (Exercise) 99 % -- -- -- --   Rating of Perceived Exertion (Exercise) 11 13 14 15 15    Perceived Dyspnea (Exercise) 1 -- -- -- --   Symptoms chest "cloudy", SOB -- -- -- --  Comments walk test restuls -- -- -- --   Duration -- Continue with 30 min of aerobic exercise without signs/symptoms of physical distress. Continue with 30 min of aerobic exercise without signs/symptoms of physical distress. Continue with 30 min of aerobic exercise without signs/symptoms of physical distress. Continue with 30 min of aerobic exercise without signs/symptoms of physical distress.   Intensity -- THRR unchanged THRR unchanged THRR unchanged THRR unchanged     Progression   Progression -- Continue to progress workloads to maintain intensity without signs/symptoms of physical distress. Continue to progress workloads to maintain intensity without signs/symptoms of physical distress. Continue to progress workloads to maintain intensity without signs/symptoms of physical distress. Continue to progress workloads to maintain intensity without signs/symptoms of physical distress.     Resistance Training   Training Prescription -- Yes Yes Yes Yes   Weight -- 4 4 l;bs 5 lbs 5   Reps -- 10-15 10-15 10-15 10-15     Treadmill   MPH -- 1.8 3 3  3.2   Grade -- 0.5 2 2  2.5   Minutes -- 15 15 15 15    METs -- 2.5 4.12 4.12 4.55     REL-XR   Level -- 3 3 5 6    Speed -- 54 60 53 49   Minutes -- 15 15 15 15    METs -- 4 4.5 4.2 3.9     Oxygen   Maintain Oxygen Saturation -- 88% or higher 88% or higher 88% or higher 88% or higher    Row Name 09/26/23 1500             Response to Exercise   Blood Pressure (Admit) 110/58       Blood Pressure (Exit) 112/60       Heart Rate (Admit) 97 bpm       Heart Rate (Exercise) 134 bpm       Heart Rate (Exit) 100 bpm       Rating of Perceived Exertion (Exercise) 15       Duration Continue with 30 min of aerobic exercise without signs/symptoms of physical distress.       Intensity THRR unchanged         Progression   Progression Continue to progress workloads to maintain intensity without signs/symptoms of physical distress.         Resistance  Training   Training Prescription Yes       Weight 8       Reps 10-15         Treadmill   MPH 3.2       Grade 2.5       Minutes 15       METs 4.55         REL-XR   Level 6       Speed 49       Minutes 15       METs 4.2         Oxygen   Maintain Oxygen Saturation 88% or higher                Exercise Comments:   Exercise Comments     Row Name 07/11/23 0756           Exercise Comments First full day of exercise!  Patient was oriented to gym and equipment including functions, settings, policies, and procedures.  Patient's individual exercise prescription and treatment plan were reviewed.  All starting workloads were established based on the results of the 6  minute walk test done at initial orientation visit.  The plan for exercise progression was also introduced and progression will be customized based on patient's performance and goals.                Exercise Goals and Review:   Exercise Goals     Row Name 06/09/23 1441             Exercise Goals   Increase Physical Activity Yes       Intervention Provide advice, education, support and counseling about physical activity/exercise needs.;Develop an individualized exercise prescription for aerobic and resistive training based on initial evaluation findings, risk stratification, comorbidities and participant's personal goals.       Expected Outcomes Short Term: Attend rehab on a regular basis to increase amount of physical activity.;Long Term: Add in home exercise to make exercise part of routine and to increase amount of physical activity.;Long Term: Exercising regularly at least 3-5 days a week.       Increase Strength and Stamina Yes       Intervention Provide advice, education, support and counseling about physical activity/exercise needs.;Develop an individualized exercise prescription for aerobic and resistive training based on initial evaluation findings, risk stratification, comorbidities and participant's  personal goals.       Expected Outcomes Short Term: Increase workloads from initial exercise prescription for resistance, speed, and METs.;Short Term: Perform resistance training exercises routinely during rehab and add in resistance training at home;Long Term: Improve cardiorespiratory fitness, muscular endurance and strength as measured by increased METs and functional capacity ( )       Able to understand and use rate of perceived exertion (RPE) scale Yes       Intervention Provide education and explanation on how to use RPE scale       Expected Outcomes Short Term: Able to use RPE daily in rehab to express subjective intensity level;Long Term:  Able to use RPE to guide intensity level when exercising independently       Able to understand and use Dyspnea scale Yes       Intervention Provide education and explanation on how to use Dyspnea scale       Expected Outcomes Short Term: Able to use Dyspnea scale daily in rehab to express subjective sense of shortness of breath during exertion;Long Term: Able to use Dyspnea scale to guide intensity level when exercising independently       Knowledge and understanding of Target Heart Rate Range (THRR) Yes       Intervention Provide education and explanation of THRR including how the numbers were predicted and where they are located for reference       Expected Outcomes Short Term: Able to state/look up THRR;Long Term: Able to use THRR to govern intensity when exercising independently;Short Term: Able to use daily as guideline for intensity in rehab       Able to check pulse independently Yes       Intervention Provide education and demonstration on how to check pulse in carotid and radial arteries.;Review the importance of being able to check your own pulse for safety during independent exercise       Expected Outcomes Short Term: Able to explain why pulse checking is important during independent exercise;Long Term: Able to check pulse independently and  accurately       Understanding of Exercise Prescription Yes       Intervention Provide education, explanation, and written materials on patient's individual exercise prescription  Expected Outcomes Short Term: Able to explain program exercise prescription;Long Term: Able to explain home exercise prescription to exercise independently                Exercise Goals Re-Evaluation :  Exercise Goals Re-Evaluation     Row Name 07/11/23 0756 07/11/23 1431 07/18/23 0805 08/08/23 1450 08/29/23 1428     Exercise Goal Re-Evaluation   Exercise Goals Review Able to understand and use rate of perceived exertion (RPE) scale;Able to understand and use Dyspnea scale;Knowledge and understanding of Target Heart Rate Range (THRR);Understanding of Exercise Prescription Increase Physical Activity;Increase Strength and Stamina;Understanding of Exercise Prescription Increase Physical Activity;Increase Strength and Stamina;Able to understand and use rate of perceived exertion (RPE) scale;Able to understand and use Dyspnea scale;Knowledge and understanding of Target Heart Rate Range (THRR);Able to check pulse independently;Understanding of Exercise Prescription Increase Physical Activity;Increase Strength and Stamina;Understanding of Exercise Prescription Increase Physical Activity;Increase Strength and Stamina;Understanding of Exercise Prescription   Comments Reviewed RPE and dyspnea scale, THR and program prescription with pt today.  Pt voiced understanding and was given a copy of goals to take home. Dennie Bible has his first day of exercise. He did good with exercise, he needs to get use to the treadmill before increasing. He ids at level 3 on the XR with a MEt of 4.0. Will continue to monitor and progreess as able. Dennie Bible is off to a good start in rehab.  He noted he was very tired and washed out after church yesterday.  Reviewed home exercise with pt today.  Pt plans to walk and use bike and staff videos at home for  exercise.  Reviewed THR, pulse, RPE, sign and symptoms, pulse oximetery and when to call 911 or MD.  Also discussed weather considerations and indoor options.  Pt voiced understanding. Dennie Bible continues to do well inn rehab. He is increasing his speeds on the treadmill and level on the XR. He is continuing to feel his fatigue by the end of the week since starting baclk work. He is hoping by exercising his endutrace will increase. Dennie Bible continues to do well in rehab. He did state that some days he feel very tried but it just comes and goes. He is increase his level in rehab and is walking the mall on off days since it is cold outside.   Expected Outcomes Short: Use RPE daily to regulate intensity.  Long: Follow program prescription in THR. Short: continue to get use to the treadmill   long termL continue to attend rehab Short: Continue to add in exercise at home Long: Continue to build up stamina. Short: Continue to add in exercise at home Long: Continue to build up stamina. Short: Continue to add in exercise at home Long: Continue to build up stamina.    Row Name 09/09/23 1354 09/30/23 0907           Exercise Goal Re-Evaluation   Exercise Goals Review Increase Physical Activity;Increase Strength and Stamina;Understanding of Exercise Prescription Increase Physical Activity;Increase Strength and Stamina;Understanding of Exercise Prescription      Comments Dennie Bible is doing well at rehab and is tolerating exercise well. He is increasing his levels on both sets of equipment each week. His is now on speed 3 with a grade of 2 for the treadmill and on levele 3 for the XR. Will continue to monitor and progress as able. Dennie Bible is doing well in reha and continue to tolerate exercise well. He has moved to one day a week due to work  and is going to exericse at home as well to keep active. Will contimiue to monitor and progress asable .      Expected Outcomes Continue to atend rehab Continue to atend rehab                 Discharge Exercise Prescription (Final Exercise Prescription Changes):  Exercise Prescription Changes - 09/26/23 1500       Response to Exercise   Blood Pressure (Admit) 110/58    Blood Pressure (Exit) 112/60    Heart Rate (Admit) 97 bpm    Heart Rate (Exercise) 134 bpm    Heart Rate (Exit) 100 bpm    Rating of Perceived Exertion (Exercise) 15    Duration Continue with 30 min of aerobic exercise without signs/symptoms of physical distress.    Intensity THRR unchanged      Progression   Progression Continue to progress workloads to maintain intensity without signs/symptoms of physical distress.      Resistance Training   Training Prescription Yes    Weight 8    Reps 10-15      Treadmill   MPH 3.2    Grade 2.5    Minutes 15    METs 4.55      REL-XR   Level 6    Speed 49    Minutes 15    METs 4.2      Oxygen   Maintain Oxygen Saturation 88% or higher             Nutrition:  Target Goals: Understanding of nutrition guidelines, daily intake of sodium 1500mg , cholesterol 200mg , calories 30% from fat and 7% or less from saturated fats, daily to have 5 or more servings of fruits and vegetables.  Biometrics:  Pre Biometrics - 06/09/23 1441       Pre Biometrics   Height 5' 6.5" (1.689 m)    Weight 146 lb 6.4 oz (66.4 kg)    Waist Circumference 32 inches    Hip Circumference 33.5 inches    Waist to Hip Ratio 0.96 %    BMI (Calculated) 23.28    Grip Strength 34.5 kg    Single Leg Stand 16.5 seconds              Nutrition Therapy Plan and Nutrition Goals:   Nutrition Assessments:  MEDIFICTS Score Key: >=70 Need to make dietary changes  40-70 Heart Healthy Diet <= 40 Therapeutic Level Cholesterol Diet  Flowsheet Row CARDIAC VIRTUAL BASED CARE from 06/08/2023 in [facility] CARDIAC REHABILITATION  Picture Your Plate Total Score on Admission 39      Picture Your Plate Scores: <16 Unhealthy dietary pattern with much room for  improvement. 41-50 Dietary pattern unlikely to meet recommendations for good health and room for improvement. 51-60 More healthful dietary pattern, with some room for improvement.  >60 Healthy dietary pattern, although there may be some specific behaviors that could be improved.    Nutrition Goals Re-Evaluation:  Nutrition Goals Re-Evaluation     Row Name 07/18/23 1096 08/08/23 1503 08/29/23 1432         Goals   Nutrition Goal Heart Healthy Diet Heart Healthy Diet Heart Healthy Diet     Comment Dennie Bible is doing well in rehab.  He has been following heart healthy diet since January.  He has been watching his sugar.  He has stayed away from desserts.  He tries to avoid processed foods and focuses on low carb diet.  He has been trying to get  a enough protein.  He has been eating canned fish to get the omega 3s.  He has been trying to stick to vegetable oils versus seed oils. Dennie Bible is doing well with his diet. He continues to follow a healty diet sicne january. He continues to watch his sugars and his carbs. He does not eat a lot of bread. PAt contninues to do well with his diet. He has been following a healthy diet for year with watching what he is eating and his carbs. He did slack a little during the holidays with sweets and sour dough bread but is getting back to his healthy choices.     Expected Outcome Short: Check salt content in canned fish Long: Continue to watch fat and sugar Short: Check salt content in canned fish Long: Continue to watch fat and sugar Short: Continue to watch food options  Long: Continue to watch fat and sugar              Nutrition Goals Discharge (Final Nutrition Goals Re-Evaluation):  Nutrition Goals Re-Evaluation - 08/29/23 1432       Goals   Nutrition Goal Heart Healthy Diet    Comment PAt contninues to do well with his diet. He has been following a healthy diet for year with watching what he is eating and his carbs. He did slack a little during the holidays with  sweets and sour dough bread but is getting back to his healthy choices.    Expected Outcome Short: Continue to watch food options  Long: Continue to watch fat and sugar             Psychosocial: Target Goals: Acknowledge presence or absence of significant depression and/or stress, maximize coping skills, provide positive support system. Participant is able to verbalize types and ability to use techniques and skills needed for reducing stress and depression.  Initial Review & Psychosocial Screening:  Initial Psych Review & Screening - 06/08/23 1343       Initial Review   Current issues with None Identified      Family Dynamics   Good Support System? Yes      Barriers   Psychosocial barriers to participate in program There are no identifiable barriers or psychosocial needs.;The patient should benefit from training in stress management and relaxation.      Screening Interventions   Interventions To provide support and resources with identified psychosocial needs;Encouraged to exercise;Provide feedback about the scores to participant    Expected Outcomes Short Term goal: Utilizing psychosocial counselor, staff and physician to assist with identification of specific Stressors or current issues interfering with healing process. Setting desired goal for each stressor or current issue identified.;Long Term Goal: Stressors or current issues are controlled or eliminated.;Short Term goal: Identification and review with participant of any Quality of Life or Depression concerns found by scoring the questionnaire.;Long Term goal: The participant improves quality of Life and PHQ9 Scores as seen by post scores and/or verbalization of changes             Quality of Life Scores:  Quality of Life - 06/08/23 1342       Quality of Life   Select Quality of Life      Quality of Life Scores   Health/Function Pre 23.67 %    Socioeconomic Pre 22.81 %    Psych/Spiritual Pre 26.93 %    Family Pre  30 %    GLOBAL Pre 25.03 %  Scores of 19 and below usually indicate a poorer quality of life in these areas.  A difference of  2-3 points is a clinically meaningful difference.  A difference of 2-3 points in the total score of the Quality of Life Index has been associated with significant improvement in overall quality of life, self-image, physical symptoms, and general health in studies assessing change in quality of life.  PHQ-9: Review Flowsheet       07/11/2023  Depression screen PHQ 2/9  Decreased Interest 0  Down, Depressed, Hopeless 0  PHQ - 2 Score 0  Altered sleeping 1  Tired, decreased energy 1  Change in appetite 0  Feeling bad or failure about yourself  0  Trouble concentrating 1  Moving slowly or fidgety/restless 0  Suicidal thoughts 0  PHQ-9 Score 3  Difficult doing work/chores Somewhat difficult   Interpretation of Total Score  Total Score Depression Severity:  1-4 = Minimal depression, 5-9 = Mild depression, 10-14 = Moderate depression, 15-19 = Moderately severe depression, 20-27 = Severe depression   Psychosocial Evaluation and Intervention:  Psychosocial Evaluation - 06/08/23 1344       Psychosocial Evaluation & Interventions   Interventions Stress management education;Relaxation education;Encouraged to exercise with the program and follow exercise prescription    Comments Patient was referred to [facility] with STEMI/Stent placement 05/27/23. He has been having some issues with hypotension since his discharge with some light-headedness and general malaise. His losartan was discontinued with improved symptoms and blood pressure. 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 He has not returned to work but plans to return once released from his cardiologist. He lives with his wife who is his main support person. His son and daughter-in-law also support him. He denies any depression, anxiety or major  stressors in his life. He says he sleeps well most of the time. He usually gets 6 hours/night which he says is good for him. He is motivated to start the program to improve his health overall. He also wants to improve his stamina; feel better overall and get back to normal. He also enjoys hiking and would like to be able to do this again. He has no barriers identified to participate in the program.    Expected Outcomes Short Term: Start the program and attend consistently. Long Term: Meet his personal goals.    Continue Psychosocial Services  Follow up required by staff             Psychosocial Re-Evaluation:  Psychosocial Re-Evaluation     Row Name 07/18/23 0807 08/08/23 1504 08/29/23 1436         Psychosocial Re-Evaluation   Current issues with Current Stress Concerns Current Stress Concerns None Identified     Comments Pat returned to work last week.  He was worn out after about 5 hours at work and went to see his doctor about it last week.  Yesterday after church he took a nap and didn't feel like going to parade in afternoon.  His attendees were happy to see him back at work.  He has been sleeping fairly well and feeling good in morning.  He did have a hard time getting to sleep last week.  He also just had his mom passed last week.  He has had a tough week overall.  He is grieving but not feeling depressed. Dennie Bible has been doing well in rehab. He continues to be tired by the end of the week. He is hoping  with his endurance building in rehab his energy will increase. I told him to contact his doctor about fatigue if he sees no improvement. He has stopped his crestor this weekend due to having muscle spasms and stomach ache. I told him to call and imform his doctor about the med change. Dennie Bible stated that he has not had any more stress since his brother got out of the hospital during the holidays. HE is sleeping well during the night. He did state that he gets tired evey now and then.     Expected  Outcomes Short: Continue to cope with losing his mom Long: Continue to adjust returning to work Short: call PCP and give updates about fatigue and stopping his crestor   long : continues to adjust to returing to work and exericse Short: let PCP about fatigue  Long: contiue ti exercise for happiness     Interventions Encouraged to attend Cardiac Rehabilitation for the exercise Encouraged to attend Cardiac Rehabilitation for the exercise Encouraged to attend Cardiac Rehabilitation for the exercise     Continue Psychosocial Services  Follow up required by staff Follow up required by staff Follow up required by staff              Psychosocial Discharge (Final Psychosocial Re-Evaluation):  Psychosocial Re-Evaluation - 08/29/23 1436       Psychosocial Re-Evaluation   Current issues with None Identified    Comments Dennie Bible stated that he has not had any more stress since his brother got out of the hospital during the holidays. HE is sleeping well during the night. He did state that he gets tired evey now and then.    Expected Outcomes Short: let PCP about fatigue  Long: contiue ti exercise for happiness    Interventions Encouraged to attend Cardiac Rehabilitation for the exercise    Continue Psychosocial Services  Follow up required by staff             Vocational Rehabilitation: Provide vocational rehab assistance to qualifying candidates.   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             Education: Education Goals: Education classes will be provided on a weekly basis, covering required topics. Participant will state understanding/return demonstration of topics presented.  Learning Barriers/Preferences:   Learning Barriers/Preferences - 06/08/23 1339       Learning Barriers/Preferences   Learning Barriers None    Learning Preferences Audio;Skilled Demonstration             Education Topics: Hypertension, Hypertension Reduction -Define heart disease and high blood pressure. Discus how high blood pressure affects the body and ways to reduce high blood pressure.   Exercise and Your Heart -Discuss why it is important to exercise, the FITT principles of exercise, normal and abnormal responses to exercise, and how to exercise safely. Flowsheet Row CARDIAC REHAB PHASE II EXERCISE from 09/14/2023 in [place] CARDIAC REHABILITATION  Date 08/03/23  Educator hb  Instruction Review Code 1- Verbalizes Understanding       Angina -Discuss definition of angina, causes of angina, treatment of angina, and how to decrease risk of having angina.   Cardiac Medications -Review what the following cardiac medications are used for,  how they affect the body, and side effects that may occur when taking the medications.  Medications include Aspirin, Beta blockers, calcium channel blockers, ACE Inhibitors, angiotensin receptor blockers, diuretics, digoxin, and antihyperlipidemics. Flowsheet Row CARDIAC REHAB PHASE II EXERCISE from 09/14/2023 in [place] CARDIAC REHABILITATION  Date 09/14/23  Educator DJ  Instruction Review Code 1- Verbalizes Understanding       Congestive Heart Failure -Discuss the definition of CHF, how to live with CHF, the signs and symptoms of CHF, and how keep track of weight and sodium intake.   Heart Disease and Intimacy -Discus the effect sexual activity has on the heart, how changes occur during intimacy as we age, and safety during sexual activity.   Smoking Cessation / COPD -Discuss different methods to quit smoking, the health benefits of quitting smoking, and the definition of COPD.   Nutrition I: Fats -Discuss the types of cholesterol, what cholesterol does  to the heart, and how cholesterol levels can be controlled. Flowsheet Row CARDIAC REHAB PHASE II EXERCISE from 09/14/2023 in [place] CARDIAC REHABILITATION  Date 08/10/23  Educator jh  Instruction Review Code 1- Verbalizes Understanding       Nutrition II: Labels -Discuss the different components of food labels and how to read food label   Heart Parts/Heart Disease and PAD -Discuss the anatomy of the heart, the pathway of blood circulation through the heart, and these are affected by heart disease. Flowsheet Row CARDIAC REHAB PHASE II EXERCISE from 09/14/2023 in [place] CARDIAC REHABILITATION  Date 07/13/23  Educator hb  Instruction Review Code 1- Verbalizes Understanding       Stress I: Signs and Symptoms -Discuss the causes of stress, how stress may lead to anxiety and depression, and ways to limit stress.   Stress II: Relaxation -Discuss different types of relaxation techniques to limit stress.   Warning Signs of Stroke / TIA -Discuss definition of a stroke, what the signs and symptoms are of a stroke, and how to identify when someone is having stroke.   Knowledge Questionnaire Score:  Knowledge Questionnaire Score - 06/08/23 1342       Knowledge Questionnaire Score   Pre Score 22/26             Core Components/Risk Factors/Patient Goals at Admission:  Personal Goals and Risk Factors at Admission - 06/09/23 1443       Core Components/Risk Factors/Patient Goals on Admission    Weight Management Weight Maintenance;Yes    Intervention Weight Management: Develop a combined nutrition and exercise program designed to reach desired caloric intake, while maintaining appropriate intake of nutrient and fiber, sodium and fats, and appropriate energy expenditure required for the weight goal.;Weight Management: Provide education and appropriate resources to help participant work on and attain dietary goals.    Admit Weight 146 lb 6.4 oz (66.4 kg)    Goal Weight:  Short Term 146 lb (66.2 kg)    Goal Weight: Long Term 146 lb (66.2 kg)    Expected Outcomes Short Term: Continue to assess and modify interventions until short term weight is achieved;Long Term: Adherence to nutrition and physical activity/exercise program aimed toward attainment of established weight goal;Weight Maintenance: Understanding of the daily nutrition guidelines, which includes 25-35% calories from fat, 7% or less cal from saturated fats, less than 200mg  cholesterol, less than 1.5gm of sodium, & 5 or more servings of fruits and vegetables daily    Improve shortness of breath with ADL's Yes    Intervention Provide education, individualized exercise  plan and daily activity instruction to help decrease symptoms of SOB with activities of daily living.    Expected Outcomes Short Term: Improve cardiorespiratory fitness to achieve a reduction of symptoms when performing ADLs;Long Term: Be able to perform more ADLs without symptoms or delay the onset of symptoms    Lipids Yes    Intervention Provide education and support for participant on nutrition & aerobic/resistive exercise along with prescribed medications to achieve LDL 70mg , HDL >40mg .    Expected Outcomes Short Term: Participant states understanding of desired cholesterol values and is compliant with medications prescribed. Participant is following exercise prescription and nutrition guidelines.;Long Term: Cholesterol controlled with medications as prescribed, with individualized exercise RX and with personalized nutrition plan. Value goals: LDL < 70mg , HDL > 40 mg.             Core Components/Risk Factors/Patient Goals Review:   Goals and Risk Factor Review     Row Name 07/18/23 0816 08/08/23 1454 08/29/23 1438         Core Components/Risk Factors/Patient Goals Review   Personal Goals Review Weight Management/Obesity;Lipids;Improve shortness of breath with ADL's Weight Management/Obesity;Lipids;Improve shortness of breath with  ADL's Weight Management/Obesity;Lipids;Improve shortness of breath with ADL's     Review Dennie Bible is doing well in rehab.  His weight is holding steady around 142 lb.  He is watching his cholesterol levels and trying to get a lot of omega 3.  His pressures are doing well.  He had been doing well, but yesterday had the "band" feeling around his chest after church and was really tired too.  His breathing will come and go with the chest band feeling.  Overally, he was feeling better other than yesterday. He did have a hard week with returning to work and losing his mom. Patcontinue to do well in rehab. His blood pressure is staying WNL and been good at rehab and home. His weight is staying between 142-147lbs and he is happy with that. Dennie Bible continues to do well in rehab. His blood pressure is staying WNL and been good at rehab He has not been checking at home so he will starte. His weight is staying between 142-147lbs and he is happy with that.     Expected Outcomes Short: Continue to try to pin point band feeling Long: Continue to improve breathing Short: contiunue to montior risk factors   long term: continue to exercise for overall wellbeing Short: contiunue to montior risk factors   long term: continue to exercise for overall wellbeing              Core Components/Risk Factors/Patient Goals at Discharge (Final Review):   Goals and Risk Factor Review - 08/29/23 1438       Core Components/Risk Factors/Patient Goals Review   Personal Goals Review Weight Management/Obesity;Lipids;Improve shortness of breath with ADL's    Review Dennie Bible continues to do well in rehab. His blood pressure is staying WNL and been good at rehab He has not been checking at home so he will starte. His weight is staying between 142-147lbs and he is happy with that.    Expected Outcomes Short: contiunue to montior risk factors   long term: continue to exercise for overall wellbeing             ITP Comments:  ITP Comments     Row  Name 06/09/23 1419 06/29/23 0826 07/11/23 0755 07/27/23 0806 08/23/23 1002   ITP Comments Patient attend orientation today.  Patient is Mudlogger.  Documentation for diagnosis can be found in [facility] 05/27/23.  Reviewed medical chart, RPE/RPD, gym safety, and program guidelines.  Patient was fitted to equipment they will be using during rehab.  Patient is scheduled to start exercise on Tuesday 06/14/23 at 1500.   Initial ITP created and sent for review and signature by Dr. Dina Rich, Medical Director for Cardiac Rehabilitation Program. 30 day review completed. ITP sent to Dr. Dina Rich, Medical Director of Cardiac Rehab. Continue with ITP unless changes are made by physician.  Program start was help for chest pain by MD. First full day of exercise!  Patient was oriented to gym and equipment including functions, settings, policies, and procedures.  Patient's individual exercise prescription and treatment plan were reviewed.  All starting workloads were established based on the results of the 6 minute walk test done at initial orientation visit.  The plan for exercise progression was also introduced and progression will be customized based on patient's performance and goals. 30 day review completed. ITP sent to Dr. Dina Rich, Medical Director of Cardiac Rehab. Continue with ITP unless changes are made by physician.  Newer to program but has also been traveling. 30 day review completed. ITP sent to Dr. Dina Rich, Medical Director of Cardiac Rehab. Continue with ITP unless changes are made by physician.    Row Name 09/21/23 1114 10/04/23 0941         ITP Comments 30 day review completed. ITP sent to Dr. Dina Rich, Medical Director of Cardiac Rehab. Continue with ITP unless changes are made by physician. Pat called yesterday to let us know that he will no longer be able to get away from work on Mondays to attend rehab.  He would like to discharge at this time.   He completed 24 sessions.               Comments: Discharge ITP

## 2023-10-04 NOTE — Progress Notes (Signed)
Discharge Progress Report  Patient Details  Name: Juan Schultz MRN: [id number] Date of Birth: 1955/10/25 Referring Provider:   Flowsheet Row CARDIAC REHAB PHASE II ORIENTATION from 06/09/2023 in [facility] CARDIAC REHABILITATION  Referring Provider Yates Decamp MD  [Primary Cardiologist: Dr. Cristal Deer McAlhany]        Number of Visits: 24  Reason for Discharge:  Patient reached a stable level of exercise. Patient independent in their exercise. Patient has met program and personal goals. Early Exit:  Back to work  Smoking History:  Social History   Tobacco Use  Smoking Status Never  Smokeless Tobacco Never    Diagnosis:  ST elevation myocardial infarction (STEMI), unspecified artery (HCC)  Status post coronary artery stent placement    Initial Exercise Prescription:  Initial Exercise Prescription - 06/09/23 1400       Date of Initial Exercise RX and Referring Provider   Date 06/09/23    Referring Provider Yates Decamp MD   Primary Cardiologist: Dr. Verne Carrow     Oxygen   Maintain Oxygen Saturation 88% or higher      Treadmill   MPH 2.7    Grade 0.5    Minutes 15    METs 3.25      REL-XR   Level 3    Speed 50    Minutes 15    METs 3      Prescription Details   Frequency (times per week) 2    Duration Progress to 30 minutes of continuous aerobic without signs/symptoms of physical distress      Intensity   THRR 40-80% of Max Heartrate 117-142    Ratings of Perceived Exertion 11-13    Perceived Dyspnea 0-4      Progression   Progression Continue to progress workloads to maintain intensity without signs/symptoms of physical distress.      Resistance Training   Training Prescription Yes    Weight 3 lb    Reps 10-15             Discharge Exercise Prescription (Final Exercise Prescription Changes):  Exercise Prescription Changes - 09/26/23 1500       Response to Exercise   Blood Pressure (Admit) 110/58    Blood Pressure (Exit)  112/60    Heart Rate (Admit) 97 bpm    Heart Rate (Exercise) 134 bpm    Heart Rate (Exit) 100 bpm    Rating of Perceived Exertion (Exercise) 15    Duration Continue with 30 min of aerobic exercise without signs/symptoms of physical distress.    Intensity THRR unchanged      Progression   Progression Continue to progress workloads to maintain intensity without signs/symptoms of physical distress.      Resistance Training   Training Prescription Yes    Weight 8    Reps 10-15      Treadmill   MPH 3.2    Grade 2.5    Minutes 15    METs 4.55      REL-XR   Level 6    Speed 49    Minutes 15    METs 4.2      Oxygen   Maintain Oxygen Saturation 88% or higher             Functional Capacity:  6 Minute Walk     Row Name 06/09/23 1434         6 Minute Walk   Phase Initial     Distance 1440 feet  Walk Time 6 minutes     # of Rest Breaks 0     MPH 2.72     METS 3.75     RPE 11     Perceived Dyspnea  1     VO2 Peak 13.13     Symptoms Yes (comment)     Comments chest "cloudy" 4/10, SOB     Resting HR 91 bpm     Resting BP 102/64     Resting Oxygen Saturation  96 %     Exercise Oxygen Saturation  during 6 min walk 99 %     Max Ex. HR 108 bpm     Max Ex. BP 134/62     2 Minute Post BP 120/66              Psychological, QOL, Others - Outcomes: PHQ 2/9:    07/11/2023    7:57 AM  Depression screen PHQ 2/9  Decreased Interest 0  Down, Depressed, Hopeless 0  PHQ - 2 Score 0  Altered sleeping 1  Tired, decreased energy 1  Change in appetite 0  Feeling bad or failure about yourself  0  Trouble concentrating 1  Moving slowly or fidgety/restless 0  Suicidal thoughts 0  PHQ-9 Score 3  Difficult doing work/chores Somewhat difficult      Nutrition & Weight - Outcomes:  Pre Biometrics - 06/09/23 1441       Pre Biometrics   Height 5' 6.5" (1.689 m)    Weight 146 lb 6.4 oz (66.4 kg)    Waist Circumference 32 inches    Hip Circumference 33.5 inches     Waist to Hip Ratio 0.96 %    BMI (Calculated) 23.28    Grip Strength 34.5 kg    Single Leg Stand 16.5 seconds

## 2023-10-05 ENCOUNTER — Encounter (HOSPITAL_COMMUNITY): Payer: PRIVATE HEALTH INSURANCE

## 2023-10-06 ENCOUNTER — Encounter (HOSPITAL_COMMUNITY): Payer: 59

## 2023-10-10 ENCOUNTER — Ambulatory Visit (HOSPITAL_COMMUNITY): Payer: PRIVATE HEALTH INSURANCE

## 2023-10-13 ENCOUNTER — Ambulatory Visit: Payer: PRIVATE HEALTH INSURANCE

## 2023-10-17 ENCOUNTER — Ambulatory Visit (HOSPITAL_COMMUNITY): Payer: PRIVATE HEALTH INSURANCE

## 2023-10-24 ENCOUNTER — Ambulatory Visit (HOSPITAL_COMMUNITY): Payer: PRIVATE HEALTH INSURANCE

## 2023-10-31 ENCOUNTER — Ambulatory Visit (HOSPITAL_COMMUNITY): Payer: PRIVATE HEALTH INSURANCE

## 2023-11-07 ENCOUNTER — Ambulatory Visit (HOSPITAL_COMMUNITY): Payer: PRIVATE HEALTH INSURANCE

## 2023-11-14 ENCOUNTER — Ambulatory Visit (HOSPITAL_COMMUNITY): Payer: PRIVATE HEALTH INSURANCE

## 2023-11-15 ENCOUNTER — Other Ambulatory Visit: Payer: Self-pay

## 2023-11-15 ENCOUNTER — Encounter (HOSPITAL_COMMUNITY): Payer: Self-pay

## 2023-11-15 ENCOUNTER — Emergency Department (HOSPITAL_COMMUNITY)
Admission: EM | Admit: 2023-11-15 | Discharge: 2023-11-16 | Disposition: A | Discharge status: Home / Self Care | Payer: PRIVATE HEALTH INSURANCE | Attending: Emergency Medicine | Admitting: Emergency Medicine

## 2023-11-15 DIAGNOSIS — R1011 Right upper quadrant pain: Secondary | ICD-10-CM | POA: Insufficient documentation

## 2023-11-15 DIAGNOSIS — Z7982 Long term (current) use of aspirin: Secondary | ICD-10-CM | POA: Diagnosis not present

## 2023-11-15 DIAGNOSIS — R5383 Other fatigue: Secondary | ICD-10-CM | POA: Insufficient documentation

## 2023-11-15 DIAGNOSIS — R531 Weakness: Secondary | ICD-10-CM | POA: Diagnosis present

## 2023-11-15 DIAGNOSIS — I2510 Atherosclerotic heart disease of native coronary artery without angina pectoris: Secondary | ICD-10-CM | POA: Diagnosis not present

## 2023-11-15 HISTORY — DX: ST elevation (STEMI) myocardial infarction of unspecified site: I21.3

## 2023-11-15 LAB — CBC
HCT: 44.5 % (ref 39.0–52.0)
Hemoglobin: 14.6 g/dL (ref 13.0–17.0)
MCH: 29.6 pg (ref 26.0–34.0)
MCHC: 32.8 g/dL (ref 30.0–36.0)
MCV: 90.3 fL (ref 80.0–100.0)
Platelets: 264 10*3/uL (ref 150–400)
RBC: 4.93 MIL/uL (ref 4.22–5.81)
RDW: 13.2 % (ref 11.5–15.5)
WBC: 6.7 10*3/uL (ref 4.0–10.5)
nRBC: 0 % (ref 0.0–0.2)

## 2023-11-15 LAB — COMPREHENSIVE METABOLIC PANEL
ALT: 33 U/L (ref 0–44)
AST: 25 U/L (ref 15–41)
Albumin: 4.5 g/dL (ref 3.5–5.0)
Alkaline Phosphatase: 52 U/L (ref 38–126)
Anion gap: 9 (ref 5–15)
BUN: 22 mg/dL (ref 8–23)
CO2: 28 mmol/L (ref 22–32)
Calcium: 9.6 mg/dL (ref 8.9–10.3)
Chloride: 101 mmol/L (ref 98–111)
Creatinine, Ser: 1.06 mg/dL (ref 0.61–1.24)
GFR, Estimated: >60 mL/min (ref >60)
Glucose, Bld: 123 mg/dL — ABNORMAL HIGH (ref 70–99)
Potassium: 3.9 mmol/L (ref 3.5–5.1)
Sodium: 138 mmol/L (ref 135–145)
Total Bilirubin: 0.5 mg/dL (ref 0.0–1.2)
Total Protein: 7.4 g/dL (ref 6.5–8.1)

## 2023-11-15 LAB — TROPONIN I (HIGH SENSITIVITY): Troponin I (High Sensitivity): 4 ng/L (ref <18)

## 2023-11-15 LAB — URINALYSIS, ROUTINE W REFLEX MICROSCOPIC
Bilirubin Urine: NEGATIVE
Glucose, UA: NEGATIVE mg/dL
Hgb urine dipstick: NEGATIVE
Ketones, ur: NEGATIVE mg/dL
Leukocytes,Ua: NEGATIVE
Nitrite: NEGATIVE
Protein, ur: NEGATIVE mg/dL
Specific Gravity, Urine: 1.021 (ref 1.005–1.030)
pH: 5 (ref 5.0–8.0)

## 2023-11-15 LAB — LIPASE, BLOOD: Lipase: 34 U/L (ref 11–51)

## 2023-11-15 NOTE — ED Triage Notes (Signed)
 Patient with c/o hot flashes, weakness and fatigue. States Hx of gallbladder issues and that it feels similar to previous gallbladder issues. Pt states he has felt run down for the last few weeks that has not gotten any better. Pain located RLQ.

## 2023-11-16 NOTE — ED Provider Notes (Signed)
 [place] EMERGENCY DEPARTMENT AT [facility] Provider Note   CSN: [id number] Arrival date & time: 11/15/23  1850     History  Chief Complaint  Patient presents with   Abdominal Pain   Weakness    Juan Schultz is a 68 y.o. male.  The history is provided by the patient and the spouse.  Patient with history of CAD with previous STEMI presents with multiple complaints.  Patient reports he has had intermittent fatigue over the past several weeks.  He also reports hot flashes and generalized weakness.  No fevers or vomiting.  No syncope.  No diarrhea or changes bowel movements.  No bloody or dark stools.  He has had brief episodes of chest pain but none at this time.  At times he does have right upper quadrant pain but none at this time.  No pain with eating.  He has normal appetite & he is sleeping well.  No new medications to explain his fatigue    Past Medical History:  Diagnosis Date   Seasonal allergies    STEMI (ST elevation myocardial infarction) (HCC)     Home Medications Prior to Admission medications   Medication Sig Start Date End Date Taking? Authorizing Provider  albuterol (VENTOLIN HFA) 108 (90 Base) MCG/ACT inhaler Inhale 2 puffs into the lungs as needed for wheezing or shortness of breath.    [provider]  aspirin EC 81 MG tablet Take 1 tablet (81 mg total) by mouth daily. Swallow whole. 05/31/23 05/30/24  Marcelino Duster, PA  atorvastatin (LIPITOR) 20 MG tablet Take 1 tablet (20 mg total) by mouth daily. 09/05/23 12/04/23  Kathleene Hazel, MD  cetirizine (ZYRTEC) 10 MG tablet Take 10 mg by mouth daily as needed for allergies.    [provider]  Cholecalciferol (D3 PO) Take 1 tablet by mouth daily.    [provider]  Evolocumab (REPATHA SURECLICK) 140 MG/ML SOAJ Inject 140 mg into the skin every 14 (fourteen) days. 09/07/23   Kathleene Hazel, MD  icosapent Ethyl (VASCEPA) 1 g capsule Take 2 capsules (2 g  total) by mouth 2 (two) times daily. 06/13/23   Reather Littler D, NP  nitroGLYCERIN (NITROSTAT) 0.4 MG SL tablet Place 1 tablet (0.4 mg total) under the tongue every 5 (five) minutes as needed for chest pain. 06/13/23   Reather Littler D, NP  Omega-3 Fatty Acids (OMEGA-3 FISH OIL) 500 MG CAPS Take 500 mg by mouth daily. Take 2 daily 1000 mg/day    [provider]  prasugrel (EFFIENT) 10 MG TABS tablet Take 1 tablet (10 mg total) by mouth daily. 06/27/23   Kathleene Hazel, MD  ranolazine (RANEXA) 500 MG 12 hr tablet Take 1 tablet (500 mg total) by mouth 2 (two) times daily. 06/14/23   Kathleene Hazel, MD      Allergies    Crestor [rosuvastatin] and Tequin [gatifloxacin]    Review of Systems   Review of Systems  Constitutional:  Positive for fatigue. Negative for fever.  Gastrointestinal:  Negative for blood in stool and vomiting.  Neurological:  Negative for syncope.  Psychiatric/Behavioral:  Negative for sleep disturbance.     Physical Exam Updated Vital Signs BP (!) 126/94 (BP Location: Right Arm)   Pulse 71   Temp (!) 97.5 F (36.4 C) (Oral)   Resp 20   SpO2 97%  Physical Exam CONSTITUTIONAL: Well developed/well nourished HEAD: Normocephalic/atraumatic EYES: EOMI/PERRL, no nystagmus,no ptosis, conjunctiva pink, no icterus ENMT: Mucous  membranes moist NECK: supple no meningeal signs CV: S1/S2 noted, no murmurs/rubs/gallops noted LUNGS: Lungs are clear to auscultation bilaterally, no apparent distress ABDOMEN: soft, nontender, no rebound or guarding GU:no cva tenderness NEURO:Awake/alert, face symmetric, no arm or leg drift is noted Equal 5/5 strength with shoulder abduction, elbow flex/extension, wrist flex/extension in upper extremities and equal hand grips bilaterally Equal 5/5 strength with hip flexion,knee flex/extension, foot dorsi/plantar flexion EXTREMITIES: pulses normal, full ROM Distal pulses equal intact, no lower extremity edema, no calf  tenderness SKIN: warm, color normal PSYCH: no abnormalities of mood noted  ED Results / Procedures / Treatments   Labs (all labs ordered are listed, but only abnormal results are displayed) Labs Reviewed  COMPREHENSIVE METABOLIC PANEL - Abnormal; Notable for the following components:      Result Value   Glucose, Bld 123 (*)    All other components within normal limits  LIPASE, BLOOD  CBC  URINALYSIS, ROUTINE W REFLEX MICROSCOPIC  TROPONIN I (HIGH SENSITIVITY)    EKG EKG Interpretation Date/Time:  Tuesday November 15 2023 23:24:27 EDT Ventricular Rate:  71 PR Interval:  172 QRS Duration:  92 QT Interval:  421 QTC Calculation: 458 R Axis:   -21  Text Interpretation: Sinus rhythm Borderline left axis deviation RSR' in V1 or V2, probably normal variant Confirmed by Zadie Rhine ([id number]) on 11/16/2023 12:01:00 AM  Radiology No results found.  Procedures Procedures    Medications Ordered in ED Medications - No data to display  ED Course/ Medical Decision Making/ A&P Clinical Course as of 11/16/23 0014  Wed Nov 16, 2023  0013 This patient is overall well-appearing.  Workup has been overall unremarkable. Mostly concerned that he is having a gallbladder issue, but no pain at this time.  Will obtain next day ultrasound of the gallbladder.  He should follow with his PCP in the next week for evaluation of his fatigue.  He may also need repeat cardiology evaluation and perhaps repeat echocardiogram. At this time no signs of any acute cardiopulmonary emergency or abdominal emergency [DW]    Clinical Course User Index [DW] Zadie Rhine, MD                                 Medical Decision Making Amount and/or Complexity of Data Reviewed Labs: ordered. Radiology: ordered. ECG/medicine tests: ordered.   This patient presents to the ED for concern of weakness, this involves an extensive number of treatment options, and is a complaint that carries with it a high risk of  complications and morbidity.  The differential diagnosis includes but is not limited to CVA, intracranial hemorrhage, acute coronary syndrome, renal failure, urinary tract infection, electrolyte disturbance, pneumonia    Comorbidities that complicate the patient evaluation: Patient's presentation is complicated by their history of CAD   Additional history obtained: Additional history obtained from spouse Records reviewed  cardiology notes reviewed  Lab Tests: I Ordered, and personally interpreted labs.  The pertinent results include: Labs overall unremarkable  Cardiac Monitoring: The patient was maintained on a cardiac monitor.  I personally viewed and interpreted the cardiac monitor which showed an underlying rhythm of:  sinus rhythm  Test Considered: Workup is overall unremarkable.  Gallbladder ultrasound can be deferred as an outpatient    Reevaluation: After the interventions noted above, I reevaluated the patient and found that they have :improved  Complexity of problems addressed: Patient's presentation is most consistent with  acute presentation  with potential threat to life or bodily function  Disposition: After consideration of the diagnostic results and the patient's response to treatment,  I feel that the patent would benefit from discharge   .    Patient is safe for discharge home Extensive workup in the ER was overall unremarkable.  EKG unchanged, negative troponin. No active pain at this time.  Will  order next-day ultrasound       Final Clinical Impression(s) / ED Diagnoses Final diagnoses:  Weakness  RUQ pain    Rx / DC Orders ED Discharge Orders          Ordered    US ABDOMEN LIMITED RUQ (LIVER/GB)        11/16/23 0014              Zadie Rhine, MD 11/16/23 0865

## 2023-11-22 ENCOUNTER — Telehealth: Payer: Self-pay | Admitting: Pharmacist

## 2023-11-22 NOTE — Telephone Encounter (Signed)
 Call to remind patient for follow up lipid lab. N/A LVM

## 2023-11-30 ENCOUNTER — Other Ambulatory Visit: Payer: Self-pay

## 2023-11-30 ENCOUNTER — Encounter (HOSPITAL_COMMUNITY): Payer: Self-pay | Admitting: *Deleted

## 2023-11-30 ENCOUNTER — Emergency Department (HOSPITAL_COMMUNITY): Payer: PRIVATE HEALTH INSURANCE

## 2023-11-30 ENCOUNTER — Emergency Department (HOSPITAL_COMMUNITY)
Admission: EM | Admit: 2023-11-30 | Discharge: 2023-12-01 | Disposition: A | Discharge status: Home / Self Care | Payer: PRIVATE HEALTH INSURANCE | Attending: Emergency Medicine | Admitting: Emergency Medicine

## 2023-11-30 DIAGNOSIS — I2510 Atherosclerotic heart disease of native coronary artery without angina pectoris: Secondary | ICD-10-CM | POA: Insufficient documentation

## 2023-11-30 DIAGNOSIS — R0789 Other chest pain: Secondary | ICD-10-CM

## 2023-11-30 DIAGNOSIS — R072 Precordial pain: Secondary | ICD-10-CM | POA: Diagnosis present

## 2023-11-30 DIAGNOSIS — Z7982 Long term (current) use of aspirin: Secondary | ICD-10-CM | POA: Insufficient documentation

## 2023-11-30 LAB — CBC
HCT: 41.9 % (ref 39.0–52.0)
Hemoglobin: 14.1 g/dL (ref 13.0–17.0)
MCH: 30.3 pg (ref 26.0–34.0)
MCHC: 33.7 g/dL (ref 30.0–36.0)
MCV: 89.9 fL (ref 80.0–100.0)
Platelets: 262 10*3/uL (ref 150–400)
RBC: 4.66 MIL/uL (ref 4.22–5.81)
RDW: 12.8 % (ref 11.5–15.5)
WBC: 6.5 10*3/uL (ref 4.0–10.5)
nRBC: 0 % (ref 0.0–0.2)

## 2023-11-30 LAB — BASIC METABOLIC PANEL WITH GFR
Anion gap: 12 (ref 5–15)
BUN: 17 mg/dL (ref 8–23)
CO2: 25 mmol/L (ref 22–32)
Calcium: 9.6 mg/dL (ref 8.9–10.3)
Chloride: 100 mmol/L (ref 98–111)
Creatinine, Ser: 0.93 mg/dL (ref 0.61–1.24)
GFR, Estimated: >60 mL/min (ref >60)
Glucose, Bld: 113 mg/dL — ABNORMAL HIGH (ref 70–99)
Potassium: 3.9 mmol/L (ref 3.5–5.1)
Sodium: 137 mmol/L (ref 135–145)

## 2023-11-30 LAB — TROPONIN I (HIGH SENSITIVITY): Troponin I (High Sensitivity): 5 ng/L (ref <18)

## 2023-11-30 NOTE — ED Triage Notes (Signed)
 Chest pain this afternoon  with shortness of breath

## 2023-11-30 NOTE — ED Triage Notes (Addendum)
 Th pt had  a mi in October  he has stents x 2  and ems came to his house in  [place] today took an EKG  and did not trnsport him he was told to take a sl niytoglycerin abd 5 baby aspirin

## 2023-11-30 NOTE — ED Provider Triage Note (Signed)
 Emergency Medicine Provider Triage Evaluation Note  Juan Schultz , a 68 y.o. male  was evaluated in triage.  Pt complains of chest pain.  History of MI with stents in October.  States chest pain began earlier this afternoon.  He denies any radiation of symptoms.  He does endorse mild nausea which began after taking baby aspirin as advised by 911.  EMS had patient take 1 dose of nitroglycerin.  Pain is described as being in the center of his chest with some mild shortness of breath  Review of Systems  Positive:  Negative:   Physical Exam  BP 122/81 (BP Location: Left Arm)   Pulse 84   Temp (!) 97.5 F (36.4 C)   Resp 16   Ht 5\' 6"  (1.676 m)   Wt 67 kg   SpO2 96%   BMI 23.84 kg/m  Gen:   Awake, no distress   Resp:  Normal effort  MSK:   Moves extremities without difficulty  Other:    Medical Decision Making  Medically screening exam initiated at 10:28 PM.  Appropriate orders placed.  Juan Schultz was informed that the remainder of the evaluation will be completed by another provider, this initial triage assessment does not replace that evaluation, and the importance of remaining in the ED until their evaluation is complete.     Darrick Grinder, PA-C 11/30/23 2230

## 2023-12-01 ENCOUNTER — Telehealth: Payer: Self-pay | Admitting: Cardiovascular Disease

## 2023-12-01 LAB — TROPONIN I (HIGH SENSITIVITY)
Troponin I (High Sensitivity): 5 ng/L (ref <18)
Troponin I (High Sensitivity): 5 ng/L (ref <18)

## 2023-12-01 LAB — D-DIMER, QUANTITATIVE: D-Dimer, Quant: 0.44 ug{FEU}/mL (ref 0.00–0.50)

## 2023-12-01 MED ORDER — ALUM & MAG HYDROXIDE-SIMETH 200-200-20 MG/5ML PO SUSP
30.0000 mL | Freq: Once | ORAL | Status: AC
  Administered 2023-12-01: 30 mL via ORAL
  Filled 2023-12-01: qty 30

## 2023-12-01 NOTE — ED Provider Notes (Signed)
 [facility] EMERGENCY DEPARTMENT AT [facility] Provider Note  CSN: [id number] Arrival date & time: 11/30/23 2138  Chief Complaint(s) Chest Pain  HPI Juan Schultz is a 68 y.o. male    The history is provided by the patient.  Chest Pain Pain location:  Substernal area Pain quality: sharp   Pain radiates to:  Does not radiate Pain severity:  Moderate Onset quality:  Gradual Duration:  9 hours (15 min of intense pain that improved with NTG) Timing:  Constant Chronicity:  New Relieved by:  Nitroglycerin Worsened by:  Nothing Associated symptoms: nausea   Associated symptoms: no vomiting   Risk factors: coronary artery disease, high cholesterol and male sex     Past Medical History Past Medical History:  Diagnosis Date   Seasonal allergies    STEMI (ST elevation myocardial infarction) [facility])    Patient Active Problem List   Diagnosis Date Noted   Status post coronary artery stent placement 05/31/2023   Coronary artery disease involving native coronary artery with unstable angina pectoris (HCC) 05/30/2023   Hyperlipidemia with target low density lipoprotein (LDL) cholesterol less than 55 mg/dL 65/78/4696   Presence of drug coated stent in right coronary artery 05/28/2023   Acute ST elevation myocardial infarction (STEMI) of inferior wall (HCC) 05/27/2023   Symptomatic cholelithiasis 10/31/2022   Special screening for malignant neoplasms, colon    Tremor 01/01/2015   Home Medication(s) Prior to Admission medications   Medication Sig Start Date End Date Taking? Authorizing Provider  albuterol (VENTOLIN HFA) 108 (90 Base) MCG/ACT inhaler Inhale 2 puffs into the lungs as needed for wheezing or shortness of breath.    [provider]  aspirin EC 81 MG tablet Take 1 tablet (81 mg total) by mouth daily. Swallow whole. 05/31/23 05/30/24  Marcelino Duster, PA  atorvastatin (LIPITOR) 20 MG tablet Take 1 tablet (20 mg total) by mouth daily. 09/05/23 12/04/23   Kathleene Hazel, MD  cetirizine (ZYRTEC) 10 MG tablet Take 10 mg by mouth daily as needed for allergies.    [provider]  Cholecalciferol (D3 PO) Take 1 tablet by mouth daily.    [provider]  Evolocumab (REPATHA SURECLICK) 140 MG/ML SOAJ Inject 140 mg into the skin every 14 (fourteen) days. 09/07/23   Kathleene Hazel, MD  icosapent Ethyl (VASCEPA) 1 g capsule Take 2 capsules (2 g total) by mouth 2 (two) times daily. 06/13/23   Reather Littler D, NP  nitroGLYCERIN (NITROSTAT) 0.4 MG SL tablet Place 1 tablet (0.4 mg total) under the tongue every 5 (five) minutes as needed for chest pain. 06/13/23   Reather Littler D, NP  Omega-3 Fatty Acids (OMEGA-3 FISH OIL) 500 MG CAPS Take 500 mg by mouth daily. Take 2 daily 1000 mg/day    [provider]  prasugrel (EFFIENT) 10 MG TABS tablet Take 1 tablet (10 mg total) by mouth daily. 06/27/23   Kathleene Hazel, MD  ranolazine (RANEXA) 500 MG 12 hr tablet Take 1 tablet (500 mg total) by mouth 2 (two) times daily. 06/14/23   Kathleene Hazel, MD  Allergies Crestor [rosuvastatin] and Tequin [gatifloxacin]  Review of Systems Review of Systems  Cardiovascular:  Positive for chest pain.  Gastrointestinal:  Positive for nausea. Negative for vomiting.   As noted in HPI  Physical Exam Vital Signs  I have reviewed the triage vital signs BP 116/83   Pulse 70   Temp 97.7 F (36.5 C) (Oral)   Resp 15   Ht 5\' 6"  (1.676 m)   Wt 67 kg   SpO2 98%   BMI 23.84 kg/m   Physical Exam Vitals reviewed.  Constitutional:      General: He is not in acute distress.    Appearance: He is well-developed. He is not diaphoretic.  HENT:     Head: Normocephalic and atraumatic.     Nose: Nose normal.  Eyes:     General: No scleral icterus.       Right eye: No discharge.        Left eye:  No discharge.     Conjunctiva/sclera: Conjunctivae normal.     Pupils: Pupils are equal, round, and reactive to light.  Cardiovascular:     Rate and Rhythm: Normal rate and regular rhythm.     Heart sounds: No murmur heard.    No friction rub. No gallop.  Pulmonary:     Effort: Pulmonary effort is normal. No respiratory distress.     Breath sounds: Normal breath sounds. No stridor. No rales.  Abdominal:     General: There is no distension.     Palpations: Abdomen is soft.     Tenderness: There is no abdominal tenderness.  Musculoskeletal:        General: No tenderness.     Cervical back: Normal range of motion and neck supple.  Skin:    General: Skin is warm and dry.     Findings: No erythema or rash.  Neurological:     Mental Status: He is alert and oriented to person, place, and time.     ED Results and Treatments Labs (all labs ordered are listed, but only abnormal results are displayed) Labs Reviewed  BASIC METABOLIC PANEL WITH GFR - Abnormal; Notable for the following components:      Result Value   Glucose, Bld 113 (*)    All other components within normal limits  CBC  D-DIMER, QUANTITATIVE  TROPONIN I (HIGH SENSITIVITY)  TROPONIN I (HIGH SENSITIVITY)  TROPONIN I (HIGH SENSITIVITY)                                                                                                                         EKG   Radiology DG Chest 2 View Result Date: 12/01/2023 CLINICAL DATA:  Initial evaluation for acute chest pain. EXAM: CHEST - 2 VIEW COMPARISON:  Prior radiograph from 05/26/2023. FINDINGS: Transverse heart size within normal limits. Mediastinal silhouette within normal limits. Lungs are mildly hypoinflated. Mild streaky bibasilar subsegmental atelectasis. No focal infiltrates. No edema or effusion. No pneumothorax. Visualized soft tissues and osseous  structures demonstrate no acute finding. IMPRESSION: Mild hypoinflation with mild bibasilar subsegmental atelectasis. No  other acute cardiopulmonary disease. Electronically Signed   By: Rise Mu M.D.   On: 12/01/2023 00:39    Medications Ordered in ED Medications  alum & mag hydroxide-simeth (MAALOX/MYLANTA) 200-200-20 MG/5ML suspension 30 mL (30 mLs Oral Given 12/01/23 0147)   Procedures Procedures  (including critical care time) Medical Decision Making / ED Course   Medical Decision Making Amount and/or Complexity of Data Reviewed Labs: ordered. Decision-making details documented in ED Course. Radiology: ordered and independent interpretation performed. Decision-making details documented in ED Course. ECG/medicine tests: ordered and independent interpretation performed. Decision-making details documented in ED Course.  Risk OTC drugs. Decision regarding hospitalization.  Chest pain  Differential diagnosis considered  Atypical features but did not improve with nitroglycerin.  No exertional exacerbation.  Associated with nausea and shortness of breath which also resolved.  He reports persistent discomfort but no longer having the severe sharp pain. EKG without acute ischemic changes. Initial troponin 6 hours after pain onset was negative. Delta troponin negative. Patient ambulated in the emergency department and again pain was not exertional.  A repeat troponin was obtained which was negative.  Low pretest probability for PE.  Dimer negative.  PE unlikely.  Dissection also unlikely. Chest x-ray without evidence of pneumonia, pneumothorax, pulmonary edema pleural effusion.  Patient does not retaining fluid and does not appear to be volume overloaded.  Heart failure unlikely.  CBC without leukocytosis or anemia.  Metabolic panel without significant electrolyte derangements or renal sufficiency.  Discussed option for observation versus close follow-up with cardiology.  Patient opted for outpatient follow-up.     Final Clinical Impression(s) / ED Diagnoses Final diagnoses:  Atypical  chest pain   The patient appears reasonably screened and/or stabilized for discharge and I doubt any other medical condition or other [facility] requiring further screening, evaluation, or treatment in the ED at this time. I have discussed the findings, Dx and Tx plan with the patient/family who expressed understanding and agree(s) with the plan. Discharge instructions discussed at length. The patient/family was given strict return precautions who verbalized understanding of the instructions. No further questions at time of discharge.  Disposition: Discharge  Condition: Good  ED Discharge Orders          Ordered    Ambulatory referral to Cardiology       Comments: If you have not heard from the Cardiology office within the next 72 hours please call [phone].   12/01/23 0981              Follow Up: Kathleene Hazel, MD [address] [place] [address] [phone]     Assunta Found, MD [address] [place] [address] [phone]        This chart was dictated using voice recognition software.  Despite best efforts to proofread,  errors can occur which can change the documentation meaning.    Nira Conn, MD 12/01/23 [phone]

## 2023-12-01 NOTE — Telephone Encounter (Signed)
 Patient was seen in the emergency room on 4/9. [place] Spaniel that he done a lot of test and labs and everything was clear. Was told by the ER Doctor that he should have an echo to make sure that was clear.

## 2023-12-01 NOTE — Telephone Encounter (Signed)
 Spoke with pt. Pt states he was in the ED last night for the 2nd time in the past 2 1/2 weeks. The 1 st he was just feeling bad, low energy and SOB. This time he also had sharp chest pain. He states he has felt very tired and not himself in the past 2 months. Pt could not remember exact blood pressures. Dr Eudelia Bunch in the ED suggested he have a Echocardiogram. Pt went to PCP today and had lab work. Gave pt ED/911 precautions and told I would forward to Dr Clifton James for review.

## 2023-12-01 NOTE — ED Notes (Signed)
 Pt ambulated with pulse oximetry with steady gait. Upon standing O2 sat 99-100%, ambulating O2 sat 95-97%.

## 2023-12-05 NOTE — Telephone Encounter (Signed)
 Pt scheduled w APP 12/09/23.

## 2023-12-05 NOTE — Telephone Encounter (Signed)
 Juan Benne, MD    Can we get him in to see an office APP or DOD next week? Chris    Left message for patient to call back to see if we can get him scheduled one day this week.  APP 12/09/23 [name] has opening.

## 2023-12-09 ENCOUNTER — Ambulatory Visit: Payer: PRIVATE HEALTH INSURANCE | Attending: Cardiovascular Disease | Admitting: Cardiology

## 2023-12-09 ENCOUNTER — Encounter: Payer: Self-pay | Admitting: Cardiology

## 2023-12-09 VITALS — BP 112/78 | HR 61 | Ht 66.0 in | Wt 158.2 lb

## 2023-12-09 DIAGNOSIS — I2119 ST elevation (STEMI) myocardial infarction involving other coronary artery of inferior wall: Secondary | ICD-10-CM | POA: Diagnosis not present

## 2023-12-09 DIAGNOSIS — E785 Hyperlipidemia, unspecified: Secondary | ICD-10-CM

## 2023-12-09 DIAGNOSIS — I25110 Atherosclerotic heart disease of native coronary artery with unstable angina pectoris: Secondary | ICD-10-CM

## 2023-12-09 DIAGNOSIS — R5383 Other fatigue: Secondary | ICD-10-CM

## 2023-12-09 DIAGNOSIS — R0602 Shortness of breath: Secondary | ICD-10-CM

## 2023-12-09 NOTE — Progress Notes (Signed)
 Cardiology Office Note:   Date:  12/09/2023  ID:  Florence Hunt, DOB 1955/11/02, MRN [id number] PCP: Minus Amel, MD  [facility] Providers Cardiologist:  Antoinette Batman, MD    History of Present Illness:   Discussed the use of AI scribe software for clinical note transcription with the patient, who gave verbal consent to proceed.  History of Present Illness Patient is a 68 year old male with coronary artery disease, hypertension, and hyperlipidemia who presents with fatigue and chest discomfort.  He has a history of coronary artery disease, having been admitted with an inferior STEMI in October 2024. During that admission, severe distal right coronary artery stenosis was treated with a drug-eluting stent, and a staged PCI of the proximal LAD was performed with another drug-eluting stent. He is currently on aspirin  and Effient .  In October 2024, an echocardiogram showed normal left ventricular ejection fraction without valvular disease. He returned to the office with recurrent chest discomfort, and his EKG showed no acute ischemic changes. Imdur  was added but was not tolerated due to headaches, leading to a switch to Ranexa .  For the past few months, he has experienced fatigue, hot flashes, and general weakness, leading to two recent emergency department visits. The first visit on November 15, 2023, showed unremarkable labs and EKG, and he was discharged. The second visit on November 30, 2023, was for atypical chest pain that did not improve with nitroglycerin  and did not worsen with exertion. EKG and troponin tests were negative. Today patient reports that this chest discomfort was different than what he felt in October with his MI.   Ongoing fatigue is described as 'going in slow motion' and 'all I can do to get through the week.' He has also experienced nausea and dizziness. He has been consistent with aspirin  and Effient  but has stopped Ranexa  and Vascepa  to see if they were  contributing to his symptoms. He has also stopped taking Ranexa . So far no improvement in symptoms.   He has a history of intolerance to statins, with muscle pain and stomach issues on Crestor , and is currently not on atorvastatin . His LDL was 231 prior to starting Lipitor, and he is now followed by PharmD clinic and on Repatha , though has not started yet due to concern of side effects.  He reports atypical chest discomfort and dyspnea that occur both with physical activity and when at rest, no clear provocation. He has noted that sometimes he experiences shortness of breath during activities like singing or riding a bike, but it is not consistent.   Patient with extensive workup over the last few months including troponin, d-dimer, CBC, CMET, thyroid , urinalysis, ESR/CRP. So far no significant abnormalities.   Studies Reviewed:    EKG:       Most recent ED ECG reviewed. Normal sinus rhythm with resolution of previously seen inferior lead TWI.  06/15/23 TTE Limited  IMPRESSIONS     1. Left ventricular ejection fraction, by estimation, is 55 to 60%. The  left ventricle has normal function. The left ventricle has no regional  wall motion abnormalities. Left ventricular diastolic parameters were  normal. The average left ventricular  global longitudinal strain is -18.3 %. The global longitudinal strain is  minimally abnormal.   2. Right ventricular systolic function is normal. The right ventricular  size is normal. There is normal pulmonary artery systolic pressure. The  estimated right ventricular systolic pressure is 13.8 mmHg.   3. The mitral valve is abnormal. Mild mitral valve  regurgitation.   4. The aortic valve is tricuspid. Aortic valve regurgitation is not  visualized. No aortic stenosis is present.   5. The inferior vena cava is normal in size with greater than 50%  respiratory variability, suggesting right atrial pressure of 3 mmHg.   6. No pericardial effusion    Comparison(s): No significant change from prior study. 05/27/2023: LVEF  55-60%.   05/27/23 TTE  IMPRESSIONS     1. Left ventricular ejection fraction, by estimation, is 55 to 60%. The  left ventricle has normal function. The left ventricle has no regional  wall motion abnormalities. There is mild concentric left ventricular  hypertrophy. Left ventricular diastolic  parameters are indeterminate.   2. Right ventricular systolic function is normal. The right ventricular  size is normal. Tricuspid regurgitation signal is inadequate for assessing  PA pressure.   3. The mitral valve is normal in structure. No evidence of mitral valve  regurgitation. No evidence of mitral stenosis.   4. The aortic valve is normal in structure. Aortic valve regurgitation is  not visualized. No aortic stenosis is present.   5. The inferior vena cava is normal in size with greater than 50%  respiratory variability, suggesting right atrial pressure of 3 mmHg.     Risk Assessment/Calculations:              Physical Exam:   VS:  BP 112/78   Pulse 61   Ht 5\' 6"  (1.676 m)   Wt 158 lb 3.2 oz (71.8 kg)   SpO2 95%   BMI 25.53 kg/m    Wt Readings from Last 3 Encounters:  12/09/23 158 lb 3.2 oz (71.8 kg)  11/30/23 147 lb 11.3 oz (67 kg)  06/27/23 147 lb 9.6 oz (67 kg)     Physical Exam Vitals reviewed.  Constitutional:      Appearance: Normal appearance.  HENT:     Head: Normocephalic.  Eyes:     Pupils: Pupils are equal, round, and reactive to light.  Cardiovascular:     Rate and Rhythm: Normal rate and regular rhythm.     Pulses: Normal pulses.     Heart sounds: Normal heart sounds.  Pulmonary:     Effort: Pulmonary effort is normal.     Breath sounds: Normal breath sounds.  Abdominal:     General: Abdomen is flat.     Palpations: Abdomen is soft.  Musculoskeletal:     Right lower leg: No edema.     Left lower leg: No edema.  Skin:    General: Skin is warm and dry.     Capillary  Refill: Capillary refill takes less than 2 seconds.  Neurological:     General: No focal deficit present.     Mental Status: He is alert and oriented to person, place, and time.  Psychiatric:        Mood and Affect: Mood normal.        Behavior: Behavior normal.        Thought Content: Thought content normal.        Judgment: Judgment normal.      ASSESSMENT AND PLAN:    Assessment & Plan Coronary Artery Disease (CAD) Inferior STEMI in October 2024 with severe distal right coronary artery stenosis treated with drug-eluting stent. Staged PCI of proximal LAD with drug-eluting stent. Currently on aspirin  and Effient . Recent EKGs and troponins are unremarkable. In fact, ECG improved with resolution of inferior lead TWI. now on Repatha . Patient's symptoms of dyspnea  and chest tightness are not consistent with ACS (and he reports different than what he felt in October).  - Continue aspirin  and Effient  for antiplatelet therapy. - Given patient's concern that Effient  is causing his fatigue, will discuss possible switch to Plavix  with Dr. Abel Hoe. - Physical exam reassuring and no symptoms consistent with CHF. Will defer repeat TTE at this time.  Hyperlipidemia Patient's LDL was 231 during STEMI admission. Has hx intolerance to Atorvastatin . Current med list shows Crestor , Repatha , and Vascepa  with patient currently not taking any of these due to concern of side effects. Discussed risk of progressive stenosis with untreated hyperlipidemia. Ideally would have LDL 55mg /dL with 2 vessel CAD. - Encouraged patient to start Repatha  - Will request close follow up with pharmD lipid clinic  Fatigue Persistent fatigue for two months, not activity-related. Extensive workup including EKGs, troponins, and blood tests unremarkable. Patient has stopped Ranexa  and Vascepa  without symptom improvement. Given lack of positive diagnostic test findings, patient is concerned that his symptoms are secondary to a  medication.  - As above, will discuss possible switch to Plavix  with Dr. Abel Hoe.  Hypertension Recently with isolated elevated blood pressure readings, likely stress-related.  - Monitor blood pressure         Signed, [name], PA-C

## 2023-12-09 NOTE — Patient Instructions (Addendum)
 Medication Instructions:   STOP TAKING: RENEXA   STOP TAKING:  ATORVASTATIN     *If you need a refill on your cardiac medications before your next appointment, please call your pharmacy*   Lab Work:  NONE ORDERED  TODAY   If you have labs (blood work) drawn today and your tests are completely normal, you will receive your results only by: MyChart Message (if you have MyChart) OR A paper copy in the mail If you have any lab test that is abnormal or we need to change your treatment, we will call you to review the results.  Testing/Procedures: NONE ORDERED  TODAY    Follow-Up: At [facility], you and your health needs are our priority.  As part of our continuing mission to provide you with exceptional heart care, our providers are all part of one team.  This team includes your primary Cardiologist (physician) and Advanced Practice Providers or APPs (Physician Assistants and Nurse Practitioners) who all work together to provide you with the care you need, when you need it.  Your next appointment:   PHARM -D  IN ONE MONTH  MEDICATION MANAGEMENT  3 month(s)  Provider:   Antoinette Batman, MD  Cherri Corns  APP  We recommend signing up for the patient portal called "MyChart".  Sign up information is provided on this After Visit Summary.  MyChart is used to connect with patients for Virtual Visits (Telemedicine).  Patients are able to view lab/test results, encounter notes, upcoming appointments, etc.  Non-urgent messages can be sent to your provider as well.   To learn more about what you can do with MyChart, go to [email or web address].   Other Instructions       1st Floor: - Lobby - Registration  - Pharmacy  - Lab - Cafe  2nd Floor: - PV Lab - Diagnostic Testing (echo, CT, nuclear med)  3rd Floor: - Vacant  4th Floor: - TCTS (cardiothoracic surgery) - AFib Clinic - Structural Heart Clinic - Vascular Surgery  - Vascular Ultrasound  5th Floor: -  HeartCare Cardiology (general and EP) - Clinical Pharmacy for coumadin, hypertension, lipid, weight-loss medications, and med management appointments    Valet parking services will be available as well.

## 2023-12-12 ENCOUNTER — Other Ambulatory Visit (HOSPITAL_COMMUNITY): Payer: Self-pay

## 2023-12-12 ENCOUNTER — Telehealth: Payer: Self-pay | Admitting: Cardiology

## 2023-12-12 MED ORDER — CLOPIDOGREL BISULFATE 75 MG PO TABS: 75.0000 mg | ORAL_TABLET | Freq: Every day | ORAL | 3 refills | Status: DC

## 2023-12-12 MED ORDER — CLOPIDOGREL BISULFATE 75 MG PO TABS
75.0000 mg | ORAL_TABLET | Freq: Every day | ORAL | 3 refills | Status: DC
  Filled 2023-12-12: qty 30, 30d supply, fill #0

## 2023-12-12 NOTE — Telephone Encounter (Signed)
 Patient is retuning LPN's call, please advise.

## 2023-12-12 NOTE — Addendum Note (Signed)
 Addended by: CHAUVIGNE, Roe Koffman on: 12/12/2023 02:00 PM   Modules accepted: Orders

## 2023-12-12 NOTE — Telephone Encounter (Signed)
 Left voicemail to return call to office

## 2023-12-12 NOTE — Telephone Encounter (Signed)
 Spoke with the patient and made him aware of recommendations to switch to Plavix . Patient verbalized understanding.

## 2023-12-12 NOTE — Telephone Encounter (Signed)
  After discussion with Dr. Abel Hoe, we will switch patient from Effient  10mg  to Plavix  75mg  once daily. Patient can simply replace his next scheduled dose of Effient  with Plavix . He should continue to take Aspirin  81mg  daily as well.  [name], PA-C

## 2023-12-14 DIAGNOSIS — Z79899 Other long term (current) drug therapy: Secondary | ICD-10-CM

## 2023-12-14 DIAGNOSIS — I2119 ST elevation (STEMI) myocardial infarction involving other coronary artery of inferior wall: Secondary | ICD-10-CM

## 2023-12-14 DIAGNOSIS — I25110 Atherosclerotic heart disease of native coronary artery with unstable angina pectoris: Secondary | ICD-10-CM

## 2023-12-15 ENCOUNTER — Other Ambulatory Visit: Payer: Self-pay

## 2023-12-15 ENCOUNTER — Telehealth: Payer: Self-pay

## 2023-12-15 DIAGNOSIS — E7800 Pure hypercholesterolemia, unspecified: Secondary | ICD-10-CM

## 2023-12-15 DIAGNOSIS — I25118 Atherosclerotic heart disease of native coronary artery with other forms of angina pectoris: Secondary | ICD-10-CM

## 2023-12-15 DIAGNOSIS — Z79899 Other long term (current) drug therapy: Secondary | ICD-10-CM

## 2023-12-15 NOTE — Telephone Encounter (Signed)
  Order placed for p2Y12 platelet inhibition test.

## 2023-12-15 NOTE — Telephone Encounter (Signed)
 [facility] called to release labs.  Labs released

## 2023-12-16 LAB — HEPATIC FUNCTION PANEL
ALT: 33 IU/L (ref 0–44)
AST: 23 IU/L (ref 0–40)
Albumin: 4.4 g/dL (ref 3.9–4.9)
Alkaline Phosphatase: 67 IU/L (ref 44–121)
Bilirubin Total: <0.2 mg/dL (ref 0.0–1.2)
Bilirubin, Direct: 0.09 mg/dL (ref 0.00–0.40)
Total Protein: 6.5 g/dL (ref 6.0–8.5)

## 2023-12-16 LAB — LIPID PANEL
Chol/HDL Ratio: 7.6 ratio — ABNORMAL HIGH (ref 0.0–5.0)
Cholesterol, Total: 280 mg/dL — ABNORMAL HIGH (ref 100–199)
HDL: 37 mg/dL — ABNORMAL LOW (ref >39)
LDL Chol Calc (NIH): 115 mg/dL — ABNORMAL HIGH (ref 0–99)
Triglycerides: 711 mg/dL (ref 0–149)
VLDL Cholesterol Cal: 128 mg/dL — ABNORMAL HIGH (ref 5–40)

## 2023-12-22 ENCOUNTER — Telehealth: Payer: Self-pay | Admitting: Pharmacist

## 2023-12-22 DIAGNOSIS — E785 Hyperlipidemia, unspecified: Secondary | ICD-10-CM

## 2023-12-22 NOTE — Telephone Encounter (Signed)
-----   Message from Nurse Manley Seeds sent at 12/21/2023  3:29 PM EDT ----- Appointment with PharmD is not until 01/27/24.  Will route to PharmD for any recommendations in the meantime or to see if appointment can be moved up.

## 2023-12-22 NOTE — Telephone Encounter (Signed)
 Pt aware via mychart  message that the lab [name] PA-C (p2Y12 platelet inhibition test) can only be drawn at [facility] and he will need to report there at his earliest convenience to have this done.  Did call over to [facility], and they confirmed with me that they can see where we placed this lab and released it in the system, and pt can float in at anytime to report to their lab to have this drawn.  Endorsed this all to the pt via this mychart encounter.  Advised the pt in this encounter to please call with any questions/concerns regarding this matter.

## 2023-12-22 NOTE — Telephone Encounter (Signed)
 Spoke to patient, reports the last lipid lab was not fasting lipid lab. It was done mid day after meal. We will repeat lab to get accurate readings. Patient reports taking Repatha  and Vascepa  regularly and tolerating it well.

## 2023-12-22 NOTE — Addendum Note (Signed)
 Addended by: Peggi Bowels on: 12/22/2023 02:48 PM   Modules accepted: Orders

## 2023-12-23 ENCOUNTER — Other Ambulatory Visit (HOSPITAL_COMMUNITY)
Admission: RE | Admit: 2023-12-23 | Discharge: 2023-12-23 | Disposition: A | Discharge status: Home / Self Care | Payer: PRIVATE HEALTH INSURANCE | Source: Ambulatory Visit | Attending: Cardiovascular Disease | Admitting: Cardiovascular Disease

## 2023-12-23 DIAGNOSIS — Z79899 Other long term (current) drug therapy: Secondary | ICD-10-CM | POA: Diagnosis present

## 2023-12-23 DIAGNOSIS — I25110 Atherosclerotic heart disease of native coronary artery with unstable angina pectoris: Secondary | ICD-10-CM | POA: Insufficient documentation

## 2023-12-23 DIAGNOSIS — E785 Hyperlipidemia, unspecified: Secondary | ICD-10-CM | POA: Diagnosis not present

## 2023-12-23 DIAGNOSIS — I2119 ST elevation (STEMI) myocardial infarction involving other coronary artery of inferior wall: Secondary | ICD-10-CM | POA: Diagnosis present

## 2023-12-23 LAB — PLATELET INHIBITION P2Y12: Platelet Function  P2Y12: 130 [PRU] — ABNORMAL LOW (ref 182–335)

## 2023-12-24 LAB — LIPID PANEL
Chol/HDL Ratio: 5.4 ratio — ABNORMAL HIGH (ref 0.0–5.0)
Cholesterol, Total: 231 mg/dL — ABNORMAL HIGH (ref 100–199)
HDL: 43 mg/dL (ref >39)
LDL Chol Calc (NIH): 155 mg/dL — ABNORMAL HIGH (ref 0–99)
Triglycerides: 178 mg/dL — ABNORMAL HIGH (ref 0–149)
VLDL Cholesterol Cal: 33 mg/dL (ref 5–40)

## 2023-12-26 NOTE — Telephone Encounter (Signed)
 Call to discuss updated lipid lab - N/A LVM

## 2023-12-29 ENCOUNTER — Telehealth: Payer: Self-pay | Admitting: Pharmacy Technician

## 2023-12-29 ENCOUNTER — Encounter: Payer: Self-pay | Admitting: Pharmacist

## 2023-12-29 NOTE — Progress Notes (Signed)
 This encounter was created in error - please disregard.

## 2023-12-29 NOTE — Telephone Encounter (Signed)
 Juan Schultz, Needing clarification:  Were you meaning to enter Leqvio for this patient?? Current treatment plan that's entered is Prolia.  Thanks [facility]

## 2024-01-13 ENCOUNTER — Other Ambulatory Visit: Payer: Self-pay

## 2024-01-13 MED ORDER — ICOSAPENT ETHYL 1 G PO CAPS
2.0000 g | ORAL_CAPSULE | Freq: Two times a day (BID) | ORAL | 3 refills | Status: AC
  Filled 2024-07-12: qty 360, 90d supply, fill #0

## 2024-01-27 ENCOUNTER — Ambulatory Visit: Payer: PRIVATE HEALTH INSURANCE | Attending: Cardiology | Admitting: Pharmacist

## 2024-01-27 ENCOUNTER — Telehealth: Payer: Self-pay | Admitting: Pharmacy Technician

## 2024-01-27 ENCOUNTER — Other Ambulatory Visit (HOSPITAL_COMMUNITY): Payer: Self-pay

## 2024-01-27 VITALS — BP 122/76 | HR 83

## 2024-01-27 DIAGNOSIS — E785 Hyperlipidemia, unspecified: Secondary | ICD-10-CM

## 2024-01-27 DIAGNOSIS — J309 Allergic rhinitis, unspecified: Secondary | ICD-10-CM | POA: Insufficient documentation

## 2024-01-27 DIAGNOSIS — G4733 Obstructive sleep apnea (adult) (pediatric): Secondary | ICD-10-CM | POA: Insufficient documentation

## 2024-01-27 DIAGNOSIS — E291 Testicular hypofunction: Secondary | ICD-10-CM | POA: Insufficient documentation

## 2024-01-27 DIAGNOSIS — R221 Localized swelling, mass and lump, neck: Secondary | ICD-10-CM | POA: Insufficient documentation

## 2024-01-27 NOTE — Telephone Encounter (Signed)
 Pharmacy Patient Advocate Encounter  Insurance verification completed.   The patient is insured through [employer]   Ran test claim for brilinta  90 MG. Currently a quantity of 60 is a 30 day supply and the co-pay is $0.00 .  This test claim was processed through [facility]- copay amounts may vary at other pharmacies due to pharmacy/plan contracts, or as the patient moves through the different stages of their insurance plan.

## 2024-01-27 NOTE — Patient Instructions (Addendum)
 Nice meeting you today  We would like your LDL (bad cholesterol) to be less than 70  I have placed an order for a fasting lipid panel which you can update in [facility]  Continue your Repatha  140mg  every 2 weeks and Vascepa  2 capsules twice a day  I will reach out to Dr Abel Hoe and convert your Plavix  to Brilinta   Start checking your blood pressure at home more frequently, especially if you are feeling dizzy  Joelene Murrain, PharmD, BCACP, CDCES, CPP [facility] [address], [place], [place] [address] Phone: [phone]; Fax: [phone] 01/27/2024 4:06 PM

## 2024-01-27 NOTE — Progress Notes (Signed)
 Patient ID: Juan Schultz                 DOB: 21-Jul-1956                    MRN: [id number]     HPI: Juan Schultz is a 68 y.o. male patient referred to lipid clinic by [name] PAc. PMH is significant for STEMI, CAD, and OSA.  Patient had recent repeat lipid panel which showed LDL increased on Repatha . However he was not fasting and had only had 2 injections at the time.  Reports feeling dizzy at night especially when laying down. Thinks it may be due to Repatha  or Plavix .  Was originally on Brilinta , which was switched to Effient , and now switched to Plavix . Had lost his job and was in between [employer]. Is now employed in [place] and is insured. Reports he had no adverse effects on Brilinta .  Since discharge he has been working on his diet and exercise. Trying to reduce carbohydrates and processed foods. Has begun exercising    Past Medical History:  Diagnosis Date   Seasonal allergies    STEMI (ST elevation myocardial infarction) (HCC)     Current Outpatient Medications on File Prior to Visit  Medication Sig Dispense Refill   albuterol (VENTOLIN HFA) 108 (90 Base) MCG/ACT inhaler Inhale 2 puffs into the lungs as needed for wheezing or shortness of breath.     aspirin  EC 81 MG tablet Take 1 tablet (81 mg total) by mouth daily. Swallow whole. 90 tablet 3   cetirizine (ZYRTEC) 10 MG tablet Take 10 mg by mouth daily as needed for allergies.     Cholecalciferol (D3 PO) Take 1 tablet by mouth daily.     clopidogrel  (PLAVIX ) 75 MG tablet Take 1 tablet (75 mg total) by mouth daily. 90 tablet 3   Evolocumab  (REPATHA  SURECLICK) 140 MG/ML SOAJ Inject 140 mg into the skin every 14 (fourteen) days. (Patient not taking: Reported on 12/09/2023) 6 mL 3   icosapent  Ethyl (VASCEPA ) 1 g capsule Take 2 capsules (2 g total) by mouth 2 (two) times daily. 360 capsule 3   nitroGLYCERIN  (NITROSTAT ) 0.4 MG SL tablet Place 1 tablet (0.4 mg total) under the tongue every 5 (five) minutes as  needed for chest pain. 25 tablet 3   Omega-3 Fatty Acids (OMEGA-3 FISH OIL) 500 MG CAPS Take 500 mg by mouth daily. Take 2 daily 1000 mg/day     No current facility-administered medications on file prior to visit.    Allergies  Allergen Reactions   Crestor  [Rosuvastatin ] Other (See Comments)    Muscle cramps   Tequin [Gatifloxacin]     Mouth got numb    Assessment/Plan:  1. Hyperlipidemia - Patient last LDL was 155 which is above goal of <70. However he had only been on Repatha  for 1 month. Will place lab orders for patient to recheck at [facility] now that he has had 4 injections.  Checked BP in room since patient reports dizziness. BP 128/82 and 122/76. Recommended he continue to monitor at home especially if he is symptomatic. Patient not currently on any antihypertensives. Requests to switch from Plavix  to Brilinta . Will verify with Dr Abel Hoe.  Continue Repatha  140mg  q 2 weeks Continue Vascepa  2 g BID Recheck lipid panel  Joelene Murrain, PharmD, BCACP, CDCES, CPP [facility] [address] [place], [place] [address] Phone: [phone]; Fax: [phone] 01/27/2024 5:03 PM

## 2024-01-31 ENCOUNTER — Ambulatory Visit: Payer: Self-pay | Admitting: Cardiovascular Disease

## 2024-01-31 LAB — LIPID PANEL
Chol/HDL Ratio: 4.2 ratio (ref 0.0–5.0)
Cholesterol, Total: 179 mg/dL (ref 100–199)
HDL: 43 mg/dL (ref >39)
LDL Chol Calc (NIH): 103 mg/dL — ABNORMAL HIGH (ref 0–99)
Triglycerides: 193 mg/dL — ABNORMAL HIGH (ref 0–149)
VLDL Cholesterol Cal: 33 mg/dL (ref 5–40)

## 2024-02-03 ENCOUNTER — Telehealth: Payer: Self-pay | Admitting: Cardiovascular Disease

## 2024-02-03 NOTE — Telephone Encounter (Signed)
 Pt c/o medication issue:  1. Name of Medication:   clopidogrel  (PLAVIX ) 75 MG tablet    2. How are you currently taking this medication (dosage and times per day)? Take 1 tablet (75 mg total) by mouth daily.   3. Are you having a reaction (difficulty breathing--STAT)? No  4. What is your medication issue? Patient stated there was going to be a change in his medication per his visit with pharmacist Larinda Plover. Patient hasn't heard anything in regard to the new medication but stated he has been of this medication for the last 3-4 days. Patient would like to know if he should resume the medication or continue to hold it. Please advise.

## 2024-02-03 NOTE — Telephone Encounter (Signed)
 Spoke to patient he stated he is going to restart Plavix  75 mg daily until he hears back from Pharm D.Stated Pharm D is checking with Dr.McAlhany.Advised I will send message to Pharm D.

## 2024-02-06 NOTE — Telephone Encounter (Signed)
 Please d/c plavix  and start Brilinta . Pt will need new Rx, PA has been approved. Please send Rx for Brilinta  90mg  BID, patient can start Brilinta  24 hours after last dose of Plavix .

## 2024-02-08 ENCOUNTER — Encounter: Payer: Self-pay | Admitting: Internal Medicine

## 2024-02-09 MED ORDER — TICAGRELOR 90 MG PO TABS: 90.0000 mg | ORAL_TABLET | Freq: Two times a day (BID) | ORAL | 1 refills | Status: DC

## 2024-02-09 NOTE — Telephone Encounter (Signed)
 Left detailed message on self identified VM (DPR) of plan to stop Plavix  and begin Brilinta  90 mg BID as outline by PharmD.  Adv pt to check the patient portal for the details as well and reply to let me know he has received the message.

## 2024-02-15 ENCOUNTER — Encounter: Payer: Self-pay | Admitting: Internal Medicine

## 2024-03-12 LAB — LAB REPORT - SCANNED: A1c: 5.7

## 2024-03-13 LAB — LAB REPORT - SCANNED
EGFR: 98
TSH: 1.38 (ref 0.41–5.90)

## 2024-03-22 ENCOUNTER — Encounter: Payer: Self-pay | Admitting: Internal Medicine

## 2024-03-30 ENCOUNTER — Encounter: Payer: Self-pay | Admitting: Internal Medicine

## 2024-03-30 ENCOUNTER — Encounter: Payer: Self-pay | Admitting: Cardiovascular Disease

## 2024-03-30 ENCOUNTER — Ambulatory Visit: Payer: PRIVATE HEALTH INSURANCE | Attending: Cardiovascular Disease | Admitting: Cardiovascular Disease

## 2024-03-30 VITALS — BP 134/70 | HR 91 | Ht 66.0 in | Wt 163.0 lb

## 2024-03-30 DIAGNOSIS — I2510 Atherosclerotic heart disease of native coronary artery without angina pectoris: Secondary | ICD-10-CM

## 2024-03-30 DIAGNOSIS — E7800 Pure hypercholesterolemia, unspecified: Secondary | ICD-10-CM

## 2024-03-30 MED ORDER — PRASUGREL HCL 10 MG PO TABS
10.0000 mg | ORAL_TABLET | Freq: Every day | ORAL | 3 refills | Status: AC
  Filled 2024-07-12 – 2024-07-17 (×2): qty 90, 90d supply, fill #0
  Filled 2024-08-16: qty 90, 90d supply, fill #1

## 2024-03-30 NOTE — Progress Notes (Signed)
 Chief Complaint  Patient presents with   Follow-up    CAD    History of Present Illness: 68 yo male with history of CAD, HTN and HLD who is here today for follow up. He was admitted with an inferior STEMI on 05/27/23 and found to have a severe distal RCA stenosis which was treated with a drug eluting stent. He had staged PCI of the proximal LAD with placement of a drug eluting stent. Echo October 2024 with LVEF=55-60%. No valve disease. He was seen in our office 06/13/23 by Katlyn West, NP and reported chest pain. His pain was not felt to be ischemia related and his EKG showed no acute changes. Pattern consistent with recent MI. Imdur  was added. Limited echo 06/15/23 with no wall motion abnormalities and no pericardial effusion. Mild MR. He did not tolerate the Imdur  secondary to headaches. He was started on Ranexa  but did not tolerate this. He did not tolerate statins but tolerated Ranexa . He was initially on Brilinta  following his MI, but was changed to Effient  and then to Plavix  but requested a switch back to Brilinta . Several ED visits over the spring of 2025 secondary to atypical chest pain.   He is here today for follow up. The patient denies any chest pain, dyspnea, palpitations, lower extremity edema, orthopnea, PND, dizziness, near syncope or syncope.   Primary Care Physician: Marvine Rush, MD   Past Medical History:  Diagnosis Date   CAD (coronary artery disease)    HTN (hypertension)    Hyperlipidemia     Past Surgical History:  Procedure Laterality Date   COLONOSCOPY N/A 03/14/2015   Procedure: COLONOSCOPY;  Surgeon: Margo LITTIE Haddock, MD;  Location: [facility];  Service: Endoscopy;  Laterality: N/A;  8:30 AM   CORONARY STENT INTERVENTION N/A 05/30/2023   Procedure: CORONARY STENT INTERVENTION;  Surgeon: Ladona Heinz, MD;  Location: [facility] INVASIVE CV LAB;  Service: Cardiovascular;  Laterality: N/A;   CORONARY/GRAFT ACUTE MI REVASCULARIZATION N/A 05/27/2023   Procedure:  Coronary/Graft Acute MI Revascularization;  Surgeon: Verlin Lonni BIRCH, MD;  Location: [facility] INVASIVE CV LAB;  Service: Cardiovascular;  Laterality: N/A;   HERNIA REPAIR  08/24/2011   KNEE ARTHROSCOPY Right 08/23/2000   LEFT HEART CATH AND CORONARY ANGIOGRAPHY N/A 05/27/2023   Procedure: LEFT HEART CATH AND CORONARY ANGIOGRAPHY;  Surgeon: Verlin Lonni BIRCH, MD;  Location: [facility] INVASIVE CV LAB;  Service: Cardiovascular;  Laterality: N/A;    Current Outpatient Medications  Medication Sig Dispense Refill   albuterol (VENTOLIN HFA) 108 (90 Base) MCG/ACT inhaler Inhale 2 puffs into the lungs as needed for wheezing or shortness of breath.     aspirin  EC 81 MG tablet Take 1 tablet (81 mg total) by mouth daily. Swallow whole. 90 tablet 3   cetirizine (ZYRTEC) 10 MG tablet Take 10 mg by mouth daily as needed for allergies.     Cholecalciferol (D3 PO) Take 1 tablet by mouth daily.     Evolocumab  (REPATHA  SURECLICK) 140 MG/ML SOAJ Inject 140 mg into the skin every 14 (fourteen) days. 6 mL 3   icosapent  Ethyl (VASCEPA ) 1 g capsule Take 2 capsules (2 g total) by mouth 2 (two) times daily. 360 capsule 3   nitroGLYCERIN  (NITROSTAT ) 0.4 MG SL tablet Place 1 tablet (0.4 mg total) under the tongue every 5 (five) minutes as needed for chest pain. 25 tablet 3   Omega-3 Fatty Acids (OMEGA-3 FISH OIL) 500 MG CAPS Take 500 mg by mouth daily. Take 2 daily 1000  mg/day     prasugrel  (EFFIENT ) 10 MG TABS tablet Take 1 tablet (10 mg total) by mouth daily. 90 tablet 3   ticagrelor  (BRILINTA ) 90 MG TABS tablet Take 1 tablet (90 mg total) by mouth 2 (two) times daily. 180 tablet 1   No current facility-administered medications for this visit.    Allergies  Allergen Reactions   Crestor  [Rosuvastatin ] Other (See Comments)    Muscle cramps   Tequin [Gatifloxacin]     Mouth got numb    Social History   Socioeconomic History   Marital status: Married    Spouse name: Darice   Number of children: 2   Years of  education: College   Highest education level: Not on file  Occupational History   Occupation: Programme researcher, broadcasting/film/video  Tobacco Use   Smoking status: Never   Smokeless tobacco: Never  Vaping Use   Vaping status: Never Used  Substance and Sexual Activity   Alcohol use: No    Alcohol/week: 0.0 standard drinks of alcohol    Comment: Quit in 1981   Drug use: No   Sexual activity: Yes  Other Topics Concern   Not on file  Social History Narrative   Lives at home with wife and son.   Caffeine use: 1 cups coffee per day   Drinks tea occass   Social Drivers of Health   Financial Resource Strain: Not on file  Food Insecurity: Low Risk  (04/20/2023)   Received from [facility]   Hunger Vital Sign    Within the past 12 months, you worried that your food would run out before you got money to buy more: Never true    Within the past 12 months, the food you bought just didn't last and you didn't have money to get more. : Never true  Transportation Needs: No Transportation Needs (04/20/2023)   Received from [facility]    In the past 12 months, has lack of reliable transportation kept you from medical appointments, meetings, work or from getting things needed for daily living? : No  Physical Activity: Not on file  Stress: Not on file  Social Connections: Not on file  Intimate Partner Violence: Unknown (10/10/2022)   [facility] from [facility]   Domestic Violence Screen    Physical Abuse Risk: Unknown    Verbal Abuse Risk: Unknown    Family History  Problem Relation Age of Onset   CAD Brother    Parkinsonism Neg Hx     Review of Systems:  As stated in the HPI and otherwise negative.   BP 134/70   Pulse 91   Ht 5' 6 (1.676 m)   Wt 163 lb (73.9 kg)   SpO2 95%   BMI 26.31 kg/m   Physical Examination: General: Well developed, well nourished, NAD  HEENT: OP clear, mucus membranes moist  SKIN: warm, dry. No rashes. Neuro: No focal deficits   Musculoskeletal: Muscle strength 5/5 all ext  Psychiatric: Mood and affect normal  Neck: No JVD, no carotid bruits, no thyromegaly, no lymphadenopathy.  Lungs:Clear bilaterally, no wheezes, rhonci, crackles Cardiovascular: Regular rate and rhythm. No murmurs, gallops or rubs. Abdomen:Soft. Bowel sounds present. Non-tender.  Extremities: No lower extremity edema. Pulses are 2 + in the bilateral DP/PT.  EKG:  EKG is not ordered today. The ekg ordered today demonstrates   Recent Labs: 05/28/2023: Magnesium 2.1 11/30/2023: BUN 17; Creatinine, Ser 0.93; Hemoglobin 14.1; Platelets 262; Potassium 3.9; Sodium 137 12/15/2023: ALT 33  Lipid Panel    Component Value Date/Time   CHOL 179 01/30/2024 0737   TRIG 193 (H) 01/30/2024 0737   HDL 43 01/30/2024 0737   CHOLHDL 4.2 01/30/2024 0737   CHOLHDL 6.1 05/27/2023 0428   VLDL 62 (H) 05/27/2023 0428   LDLCALC 103 (H) 01/30/2024 0737     Wt Readings from Last 3 Encounters:  03/30/24 163 lb (73.9 kg)  12/09/23 158 lb 3.2 oz (71.8 kg)  11/30/23 147 lb 11.3 oz (67 kg)    Assessment and Plan:   1. CAD without angina: No chest pain. He is now 10 months out from his MI and stents. Will continue DAPT with ASA and change to Effient  as he thinks the Brilinta  is causing dyspnea. DAPT for total of one year post stent. He is statin intolerant. Continue Repatha .     2. HLD: Goal LDL under 70. LDL 103 in June 2025. Statin intolerant. Continue Repatha .   Labs/ tests ordered today include:  No orders of the defined types were placed in this encounter.  Disposition:   F/U with me in 12 months.    Signed, Lonni Cash, MD, [facility] 03/30/2024 4:13 PM    [facility] [address] [place], [place], [place]  [address] Phone: [phone]; Fax: [phone]

## 2024-03-30 NOTE — Patient Instructions (Signed)
 Medication Instructions:  START Effient  10 mg daily   STOP ticagrelor  (Brilinta ) once you get your Effient .  Follow-Up: At [facility], you and your health needs are our priority.  As part of our continuing mission to provide you with exceptional heart care, our providers are all part of one team.  This team includes your primary Cardiologist (physician) and Advanced Practice Providers or APPs (Physician Assistants and Nurse Practitioners) who all work together to provide you with the care you need, when you need it.  Your next appointment:   6 month(s)  Provider:   Lonni Cash, MD    We recommend signing up for the patient portal called MyChart.  Sign up information is provided on this After Visit Summary.  MyChart is used to connect with patients for Virtual Visits (Telemedicine).  Patients are able to view lab/test results, encounter notes, upcoming appointments, etc.  Non-urgent messages can be sent to your provider as well.   To learn more about what you can do with MyChart, go to [email or web address].

## 2024-04-09 ENCOUNTER — Ambulatory Visit
Admission: EM | Admit: 2024-04-09 | Discharge: 2024-04-09 | Disposition: A | Discharge status: Home / Self Care | Payer: PRIVATE HEALTH INSURANCE | Attending: Family Medicine | Admitting: Family Medicine

## 2024-04-09 DIAGNOSIS — R140 Abdominal distension (gaseous): Secondary | ICD-10-CM

## 2024-04-09 DIAGNOSIS — R1010 Upper abdominal pain, unspecified: Secondary | ICD-10-CM | POA: Diagnosis not present

## 2024-04-09 LAB — POCT URINE DIPSTICK
Bilirubin, UA: NEGATIVE
Blood, UA: NEGATIVE
Glucose, UA: NEGATIVE mg/dL
Leukocytes, UA: NEGATIVE
Nitrite, UA: NEGATIVE
POC PROTEIN,UA: NEGATIVE
Spec Grav, UA: 1.025 (ref 1.010–1.025)
Urobilinogen, UA: 0.2 U/dL
pH, UA: 5.5 (ref 5.0–8.0)

## 2024-04-09 NOTE — Discharge Instructions (Signed)
 Your vital signs, exam and urine test so far have been reassuring.  We have sent out some labs today and we will let you know tomorrow if anything comes back abnormal.  In the meantime, I recommend trying some Gas-X, eating bland foods, drinking plenty of water  and calling your primary care in the morning for immediate follow-up.  If your symptoms continue to progressively worsen go to the emergency department for further evaluation.

## 2024-04-09 NOTE — ED Triage Notes (Signed)
 Pt states he is having burning/bloating on his upper abdomin, more on RUQ x 3-4 weeks. Dark urine. Not taking any OTC medication at this time.

## 2024-04-09 NOTE — ED Provider Notes (Signed)
 [facility]    CSN: [id number] Arrival date & time: 04/09/24  1703      History   Chief Complaint Chief Complaint  Patient presents with   Abdominal Pain    HPI Juan Schultz is a 68 y.o. male.   Patient presenting today with several week history of upper abdominal burning, bloating, right upper quadrant discomfort.  States it waxes and wanes but is always present.  Recently also noticing some darker urine.  Denies fever, chills, nausea, vomiting, bowel changes, dietary or medication changes.  No known history of chronic GI issues.    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   Status post coronary artery stent placement 05/31/2023   Coronary artery disease involving native coronary artery with unstable angina pectoris (HCC) 05/30/2023   Hyperlipidemia with target low density lipoprotein (LDL) cholesterol less than 55 mg/dL 89/92/7975   Presence of drug coated stent in right coronary artery 05/28/2023   Acute ST elevation myocardial infarction (STEMI) of inferior wall (HCC) 05/27/2023   Symptomatic cholelithiasis 10/31/2022   Arthralgia of right knee 03/08/2022   Osteoarthritis of right knee 03/08/2022   Special screening for malignant neoplasms, colon    Tremor 01/01/2015    Past Surgical History:  Procedure Laterality Date   COLONOSCOPY N/A 03/14/2015   Procedure: COLONOSCOPY;  Surgeon: Margo LITTIE Haddock, MD;  Location: [facility];  Service: Endoscopy;  Laterality: N/A;  8:30 AM   CORONARY STENT INTERVENTION N/A 05/30/2023   Procedure: CORONARY STENT INTERVENTION;  Surgeon: Ladona Heinz, MD;  Location: [facility];  Service: Cardiovascular;  Laterality: N/A;   CORONARY/GRAFT ACUTE MI  REVASCULARIZATION N/A 05/27/2023   Procedure: Coronary/Graft Acute MI Revascularization;  Surgeon: Verlin Lonni BIRCH, MD;  Location: [facility];  Service: Cardiovascular;  Laterality: N/A;   HERNIA REPAIR  08/24/2011   KNEE ARTHROSCOPY Right 08/23/2000   LEFT HEART CATH AND CORONARY ANGIOGRAPHY N/A 05/27/2023   Procedure: LEFT HEART CATH AND CORONARY ANGIOGRAPHY;  Surgeon: Verlin Lonni BIRCH, MD;  Location: [facility];  Service: Cardiovascular;  Laterality: N/A;       Home Medications    Prior to Admission medications   Medication Sig Start Date End Date Taking? Authorizing Provider  aspirin  EC 81 MG tablet Take 1 tablet (81 mg total) by mouth daily. Swallow whole. 05/31/23 05/30/24 Yes Duke, Jon Garre, PA  Evolocumab  (REPATHA  SURECLICK) 140 MG/ML SOAJ Inject 140 mg into the skin every 14 (fourteen) days. 09/07/23  Yes Verlin Lonni BIRCH, MD  icosapent  Ethyl (VASCEPA ) 1 g capsule Take 2 capsules (2 g total) by mouth 2 (two) times daily. 01/13/24  Yes Williams, Evan, PA-C  prasugrel  (EFFIENT ) 10 MG TABS tablet Take 1 tablet (10 mg total) by mouth daily. 03/30/24  Yes Verlin Lonni BIRCH, MD  albuterol (VENTOLIN HFA) 108 (90 Base) MCG/ACT inhaler Inhale 2 puffs into the lungs as needed for wheezing or shortness of breath.    [provider]  cetirizine (ZYRTEC) 10 MG tablet Take 10 mg by mouth daily as needed for allergies.    [provider]  Cholecalciferol (D3 PO) Take 1 tablet by mouth daily.    [provider]  nitroGLYCERIN  (NITROSTAT ) 0.4 MG SL tablet Place 1 tablet (0.4 mg total) under the tongue every 5 (five) minutes as needed for chest pain. 06/13/23   West, Katlyn D, NP  Omega-3 Fatty Acids (OMEGA-3 FISH OIL) 500 MG CAPS Take 500 mg by mouth daily. Take 2 daily 1000 mg/day    [provider]  ticagrelor  (BRILINTA ) 90 MG TABS tablet Take 1 tablet (90 mg total) by mouth 2 (two) times daily. 02/09/24   Verlin Lonni BIRCH, MD    Family History Family History  Problem Relation Age of Onset   CAD Brother    Parkinsonism Neg Hx     Social History Social History   Tobacco Use   Smoking status: Never   Smokeless tobacco: Never  Vaping Use   Vaping status: Never Used  Substance Use Topics   Alcohol use: No    Alcohol/week: 0.0 standard drinks of alcohol    Comment: Quit in 1981   Drug use: No     Allergies   Crestor  [rosuvastatin ] and Tequin [gatifloxacin]   Review of Systems Review of Systems Per HPI  Physical Exam Triage Vital Signs ED Triage Vitals  Encounter Vitals Group     BP 04/09/24 1804 (!) 148/85     Girls Systolic BP Percentile --      Girls Diastolic BP Percentile --      Boys Systolic BP Percentile --      Boys Diastolic BP Percentile --      Pulse Rate 04/09/24 1804 92     Resp 04/09/24 1804 18     Temp 04/09/24 1804 97.8 F (36.6 C)     Temp Source 04/09/24 1804 Oral     SpO2 04/09/24 1821 94 %     Weight --      Height --      Head Circumference --      Peak Flow --      Pain Score 04/09/24 1809 3     Pain Loc --      Pain Education --      Exclude from Growth Chart --    No data found.  Updated Vital Signs BP (!) 148/85 (BP Location: Right Arm)   Pulse 92   Temp 97.8 F (36.6 C) (Oral)   Resp 18   SpO2 94%   Visual Acuity Right Eye Distance:   Left Eye Distance:   Bilateral Distance:    Right Eye Near:   Left Eye Near:    Bilateral Near:     Physical Exam Vitals and nursing note reviewed.  Constitutional:      Appearance: Normal appearance.  HENT:     Head: Atraumatic.     Mouth/Throat:     Mouth: Mucous membranes are moist.  Eyes:     Extraocular Movements: Extraocular movements intact.     Conjunctiva/sclera: Conjunctivae normal.  Cardiovascular:     Rate and Rhythm: Normal rate and regular rhythm.  Pulmonary:     Effort: Pulmonary effort is normal.     Breath sounds: Normal breath sounds.  Abdominal:     General:  Bowel sounds are normal. There is no distension.     Palpations: Abdomen is soft.     Tenderness: There is abdominal tenderness. There is no right CVA tenderness, left CVA tenderness or guarding.     Comments: Right upper quadrant and epigastric tenderness to palpation without distention or guarding  Musculoskeletal:        General: Normal range  of motion.     Cervical back: Normal range of motion and neck supple.  Skin:    General: Skin is warm and dry.  Neurological:     General: No focal deficit present.     Mental Status: He is oriented to person, place, and time.  Psychiatric:        Mood and Affect: Mood normal.        Thought Content: Thought content normal.        Judgment: Judgment normal.      UC Treatments / Results  Labs (all labs ordered are listed, but only abnormal results are displayed) Labs Reviewed  POCT URINE DIPSTICK - Abnormal; Notable for the following components:      Result Value   Ketones, POC UA trace (5) (*)    All other components within normal limits  CBC WITH DIFFERENTIAL/PLATELET  COMPREHENSIVE METABOLIC PANEL WITH GFR  LIPASE    EKG   Radiology No results found.  Procedures Procedures (including critical care time)  Medications Ordered in UC Medications - No data to display  Initial Impression / Assessment and Plan / UC Course  I have reviewed the triage vital signs and the nursing notes.  Pertinent labs & imaging results that were available during my care of the patient were reviewed by me and considered in my medical decision making (see chart for details).     Minimally hypertensive in triage otherwise vital signs within normal limits.  He is well-appearing and in no acute distress.  Physical exam today revealing no red flag findings.  Urinalysis today overall within normal limits with no concerning features.  CBC, CMP, lipase pending for further evaluation but did discuss to follow-up as soon as possible with primary care if  symptoms are not improving with dietary modifications as reviewed, simethicone , and other supportive measures.  ED for worsening symptoms at any time.  Final Clinical Impressions(s) / UC Diagnoses   Final diagnoses:  Upper abdominal pain  Abdominal bloating     Discharge Instructions      Your vital signs, exam and urine test so far have been reassuring.  We have sent out some labs today and we will let you know tomorrow if anything comes back abnormal.  In the meantime, I recommend trying some Gas-X, eating bland foods, drinking plenty of water  and calling your primary care in the morning for immediate follow-up.  If your symptoms continue to progressively worsen go to the emergency department for further evaluation.    ED Prescriptions   None    PDMP not reviewed this encounter.   Stuart Vernell Norris, [place] 04/09/24 1909

## 2024-04-10 ENCOUNTER — Ambulatory Visit (HOSPITAL_COMMUNITY): Payer: Self-pay

## 2024-04-10 LAB — COMPREHENSIVE METABOLIC PANEL WITH GFR
ALT: 33 IU/L (ref 0–44)
AST: 23 IU/L (ref 0–40)
Albumin: 4.6 g/dL (ref 3.9–4.9)
Alkaline Phosphatase: 55 IU/L (ref 44–121)
BUN/Creatinine Ratio: 15 (ref 10–24)
BUN: 15 mg/dL (ref 8–27)
Bilirubin Total: 0.3 mg/dL (ref 0.0–1.2)
CO2: 23 mmol/L (ref 20–29)
Calcium: 9.3 mg/dL (ref 8.6–10.2)
Chloride: 102 mmol/L (ref 96–106)
Creatinine, Ser: 1.01 mg/dL (ref 0.76–1.27)
Globulin, Total: 2.3 g/dL (ref 1.5–4.5)
Glucose: 87 mg/dL (ref 70–99)
Potassium: 4.1 mmol/L (ref 3.5–5.2)
Sodium: 141 mmol/L (ref 134–144)
Total Protein: 6.9 g/dL (ref 6.0–8.5)
eGFR: 82 mL/min/1.73 (ref >59)

## 2024-04-10 LAB — CBC WITH DIFFERENTIAL/PLATELET
Basophils Absolute: 0.1 x10E3/uL (ref 0.0–0.2)
Basos: 1 %
EOS (ABSOLUTE): 0.2 x10E3/uL (ref 0.0–0.4)
Eos: 2 %
Hematocrit: 48 % (ref 37.5–51.0)
Hemoglobin: 15.2 g/dL (ref 13.0–17.7)
Immature Grans (Abs): 0 x10E3/uL (ref 0.0–0.1)
Immature Granulocytes: 0 %
Lymphocytes Absolute: 1.9 x10E3/uL (ref 0.7–3.1)
Lymphs: 31 %
MCH: 29.2 pg (ref 26.6–33.0)
MCHC: 31.7 g/dL (ref 31.5–35.7)
MCV: 92 fL (ref 79–97)
Monocytes Absolute: 0.7 x10E3/uL (ref 0.1–0.9)
Monocytes: 12 %
Neutrophils Absolute: 3.3 x10E3/uL (ref 1.4–7.0)
Neutrophils: 54 %
Platelets: 267 x10E3/uL (ref 150–450)
RBC: 5.21 x10E6/uL (ref 4.14–5.80)
RDW: 14.3 % (ref 11.6–15.4)
WBC: 6.2 x10E3/uL (ref 3.4–10.8)

## 2024-04-10 LAB — LIPASE: Lipase: 35 U/L (ref 13–78)

## 2024-04-12 ENCOUNTER — Ambulatory Visit: Payer: Self-pay

## 2024-04-12 NOTE — Telephone Encounter (Signed)
 This is not our patient he is not scheduled to change over to our providers until Oct. Please forward to his PCP.

## 2024-04-12 NOTE — Telephone Encounter (Signed)
 FYI Only or Action Required?: FYI only for provider.  Patient was last seen in primary care on new patient.  Called Nurse Triage reporting Fatigue.  Symptoms began about a month ago.  Interventions attempted: Rest, hydration, or home remedies.  Symptoms are: unchanged.  Triage Disposition: See PCP Within 2 Weeks  Patient/caregiver understands and will follow disposition?: Yes   Copied from CRM [id number]. Topic: Clinical - Red Word Triage >> Apr 12, 2024 10:04 AM Adelita E wrote: Reason for CRM: Fatigue. Patient stating it has been going on for awhile, but getting worse over the past month, potentially related to Evolocumab  (REPATHA  SURECLICK) 140 MG/ML. Patient by the end of the day, he is feeling more tired and exhausted. Reason for Disposition  Weakness is a chronic symptom (recurrent or ongoing AND present > 4 weeks)  Answer Assessment - Initial Assessment Questions Patient has been recently seen by cardiology and reported these symptoms   1. DESCRIPTION: Describe how you are feeling.     Tired all day long that worsens during the day 2. SEVERITY: How bad is it?  Can you stand and walk?     Able to complete all ADL's and go to work 3. ONSET: When did these symptoms begin? (e.g., hours, days, weeks, months)     Has been tired for years, falling asleep after coming home from work, but for the last month, the fatigue has worsened 4. CAUSE: What do you think is causing the weakness or fatigue? (e.g., not drinking enough fluids, medical problem, trouble sleeping)     Unsure, history of MI, and also has low testosterone and vitamin D.  Receiving testosterone injections, but missed last dose as he fell asleep and missed the appointment 5. NEW MEDICINES:  Have you started on any new medicines recently? (e.g., opioid pain medicines, benzodiazepines, muscle relaxants, antidepressants, antihistamines, neuroleptics, beta blockers)     Recently changed to Brelenta to Effient   and has been on Repatha  x 3 months 6. OTHER SYMPTOMS: Do you have any other symptoms? (e.g., chest pain, fever, cough, SOB, vomiting, diarrhea, bleeding, other areas of pain)     Denies all of these symptoms 7. PREGNANCY: Is there any chance you are pregnant? When was your last menstrual period?     N/A  Protocols used: Weakness (Generalized) and Fatigue-A-AH

## 2024-04-17 ENCOUNTER — Encounter: Payer: Self-pay | Admitting: Internal Medicine

## 2024-04-30 ENCOUNTER — Other Ambulatory Visit: Payer: Self-pay | Admitting: Endocrinology

## 2024-04-30 DIAGNOSIS — E291 Testicular hypofunction: Secondary | ICD-10-CM

## 2024-05-07 ENCOUNTER — Encounter: Payer: Self-pay | Admitting: Internal Medicine

## 2024-05-07 ENCOUNTER — Encounter: Payer: Self-pay | Admitting: Endocrinology

## 2024-05-08 ENCOUNTER — Other Ambulatory Visit: Payer: PRIVATE HEALTH INSURANCE

## 2024-05-30 ENCOUNTER — Encounter: Payer: Self-pay | Admitting: Internal Medicine

## 2024-05-30 ENCOUNTER — Telehealth: Payer: Self-pay

## 2024-05-30 ENCOUNTER — Other Ambulatory Visit (HOSPITAL_COMMUNITY): Payer: Self-pay

## 2024-05-30 ENCOUNTER — Ambulatory Visit (INDEPENDENT_AMBULATORY_CARE_PROVIDER_SITE_OTHER): Payer: PRIVATE HEALTH INSURANCE | Admitting: Internal Medicine

## 2024-05-30 VITALS — BP 136/82 | HR 74 | Temp 98.0°F | Ht 66.0 in | Wt 161.8 lb

## 2024-05-30 DIAGNOSIS — Z955 Presence of coronary angioplasty implant and graft: Secondary | ICD-10-CM | POA: Diagnosis not present

## 2024-05-30 DIAGNOSIS — I252 Old myocardial infarction: Secondary | ICD-10-CM | POA: Diagnosis not present

## 2024-05-30 DIAGNOSIS — G4733 Obstructive sleep apnea (adult) (pediatric): Secondary | ICD-10-CM | POA: Diagnosis not present

## 2024-05-30 DIAGNOSIS — I2510 Atherosclerotic heart disease of native coronary artery without angina pectoris: Secondary | ICD-10-CM

## 2024-05-30 DIAGNOSIS — I10 Essential (primary) hypertension: Secondary | ICD-10-CM | POA: Diagnosis not present

## 2024-05-30 DIAGNOSIS — E291 Testicular hypofunction: Secondary | ICD-10-CM

## 2024-05-30 DIAGNOSIS — Z789 Other specified health status: Secondary | ICD-10-CM | POA: Diagnosis not present

## 2024-05-30 DIAGNOSIS — R5383 Other fatigue: Secondary | ICD-10-CM

## 2024-05-30 LAB — LIPID PANEL
Cholesterol: 280 mg/dL — ABNORMAL HIGH (ref 0–200)
HDL: 53.2 mg/dL (ref >39.00)
LDL Cholesterol: 196 mg/dL — ABNORMAL HIGH (ref 0–99)
NonHDL: 226.74
Total CHOL/HDL Ratio: 5
Triglycerides: 153 mg/dL — ABNORMAL HIGH (ref 0.0–149.0)
VLDL: 30.6 mg/dL (ref 0.0–40.0)

## 2024-05-30 LAB — CBC WITH DIFFERENTIAL/PLATELET
Basophils Absolute: 0.1 K/uL (ref 0.0–0.1)
Basophils Relative: 1.4 % (ref 0.0–3.0)
Eosinophils Absolute: 0.1 K/uL (ref 0.0–0.7)
Eosinophils Relative: 1.5 % (ref 0.0–5.0)
HCT: 48.6 % (ref 39.0–52.0)
Hemoglobin: 15.9 g/dL (ref 13.0–17.0)
Lymphocytes Relative: 36.1 % (ref 12.0–46.0)
Lymphs Abs: 1.4 K/uL (ref 0.7–4.0)
MCHC: 32.6 g/dL (ref 30.0–36.0)
MCV: 88.2 fl (ref 78.0–100.0)
Monocytes Absolute: 0.4 K/uL (ref 0.1–1.0)
Monocytes Relative: 9.7 % (ref 3.0–12.0)
Neutro Abs: 2 K/uL (ref 1.4–7.7)
Neutrophils Relative %: 51.3 % (ref 43.0–77.0)
Platelets: 232 K/uL (ref 150.0–400.0)
RBC: 5.51 Mil/uL (ref 4.22–5.81)
RDW: 13.9 % (ref 11.5–15.5)
WBC: 3.9 K/uL — ABNORMAL LOW (ref 4.0–10.5)

## 2024-05-30 LAB — COMPREHENSIVE METABOLIC PANEL WITH GFR
ALT: 39 U/L (ref 0–53)
AST: 22 U/L (ref 0–37)
Albumin: 4.8 g/dL (ref 3.5–5.2)
Alkaline Phosphatase: 49 U/L (ref 39–117)
BUN: 16 mg/dL (ref 6–23)
CO2: 30 meq/L (ref 19–32)
Calcium: 9.7 mg/dL (ref 8.4–10.5)
Chloride: 103 meq/L (ref 96–112)
Creatinine, Ser: 1.14 mg/dL (ref 0.40–1.50)
GFR: 67.15 mL/min (ref >60.00)
Glucose, Bld: 117 mg/dL — ABNORMAL HIGH (ref 70–99)
Potassium: 5.1 meq/L (ref 3.5–5.1)
Sodium: 140 meq/L (ref 135–145)
Total Bilirubin: 0.3 mg/dL (ref 0.2–1.2)
Total Protein: 7.1 g/dL (ref 6.0–8.3)

## 2024-05-30 LAB — TESTOSTERONE: Testosterone: 170.08 ng/dL — ABNORMAL LOW (ref 300.00–890.00)

## 2024-05-30 LAB — TSH: TSH: 2.26 u[IU]/mL (ref 0.35–5.50)

## 2024-05-30 MED ORDER — LOSARTAN POTASSIUM 25 MG PO TABS
12.5000 mg | ORAL_TABLET | Freq: Every day | ORAL | 3 refills | Status: AC
  Filled 2024-07-12: qty 45, 90d supply, fill #0

## 2024-05-30 MED ORDER — WEGOVY 0.25 MG/0.5ML ~~LOC~~ SOAJ
0.2500 mg | SUBCUTANEOUS | 2 refills | Status: AC
  Filled 2024-07-12: qty 2, 28d supply, fill #0

## 2024-05-30 NOTE — Assessment & Plan Note (Signed)
 Low testosterone levels contribute to fatigue. Previous testosterone injections caused fluctuations in energy levels. Discussed benefits of testosterone gel for steady state levels and reduced cardiovascular risk. Start testosterone gel therapy. Discuss GoodRx options for obtaining testosterone gel at a lower cost.

## 2024-05-30 NOTE — Assessment & Plan Note (Signed)
 He has a history of myocardial infarction from last October with subsequent stent placement. Current management includes medications and lifestyle modifications to prevent further cardiac events. Juan Schultz is expected to reduce the risk of another myocardial infarction by approximately 30%, lowering the risk from 8% to around 5.5-6%. Prescribe [id number] for cardiovascular risk reduction and weight loss. Recommend cardiac rehabilitation. Advise on dietary intake of omega-3 and omega-9 fatty acids, including fish oil, extra virgin olive oil, and avocado. Encourage regular exercise within cardiologist's recommendations.  He has hyperlipidemia with intolerance to statins due to muscle pain. Current LDL levels are high, contributing to cardiovascular risk. Repatha  was previously stopped due to fatigue concerns. Restart Repatha  if fatigue does not improve without it. Check cholesterol levels with blood work.

## 2024-05-30 NOTE — Assessment & Plan Note (Signed)
 He has a history of myocardial infarction from last October with subsequent stent placement. Current management includes medications and lifestyle modifications to prevent further cardiac events. Georjean is expected to reduce the risk of another myocardial infarction by approximately 30%, lowering the risk from 8% to around 5.5-6%. Prescribe [id number] for cardiovascular risk reduction and weight loss. Recommend cardiac rehabilitation. Advise on dietary intake of omega-3 and omega-9 fatty acids, including fish oil, extra virgin olive oil, and avocado. Encourage regular exercise within cardiologist's recommendations.  He has hyperlipidemia with intolerance to statins due to muscle pain. Current LDL levels are high, contributing to cardiovascular risk. Repatha  was previously stopped due to fatigue concerns. Restart Repatha  if fatigue does not improve without it. Check cholesterol levels with blood work.

## 2024-05-30 NOTE — Assessment & Plan Note (Signed)
 His hypertension is not at goal, with previous low blood pressure episodes on medication. Prescribe a low dose of blood pressure medication with instructions to take half a tablet. Monitor blood pressure, especially with exercise.  Prior intolerance to ace/arb seems due to to dosing related intolerance

## 2024-05-30 NOTE — Assessment & Plan Note (Signed)
 Chronic fatigue impacts daily activities, possibly related to low testosterone, heart damage, or sleep apnea. Fatigue worsens with physical activity. Testosterone gel therapy is expected to provide steady state levels and reduce cardiovascular risk. Sleep apnea and thyroid  function are also considered potential contributors. Start testosterone gel therapy. Order blood work to assess thyroid  function, blood counts, cholesterol, liver, and kidney function. Check testosterone levels. Refer to cardiologist for exercise recommendations.

## 2024-05-30 NOTE — Assessment & Plan Note (Signed)
 Suspected obstructive sleep apnea contributes to fatigue. He has not tolerated CPAP in the past. Procedural intervention may be necessary to improve sleep quality and reduce fatigue. Refer to ENT for evaluation and potential procedural intervention for sleep apnea.

## 2024-05-30 NOTE — Progress Notes (Signed)
 [facility]  Phone: [phone]  - Medical Office Visit -  Visit Date: 05/30/2024 Patient: Juan Schultz   DOB: 1956-07-30   68 y.o. Male  MRN: [id number] Patient Care Team: Jesus Bernardino MATSU, MD as PCP - General (Internal Medicine) Tommas Pears, MD as Referring Physician (Endocrinology) Verlin Lonni BIRCH, MD as Consulting Physician (Cardiology) Mavis Purchase, MD as Consulting Physician (Neurosurgery) Today's Health Care Provider: Bernardino MATSU Jesus, MD  ===========================================    Chief Complaint / Reason for Visit: New Pt (Pt is present to est care with pcp) and Fatigue (Has been having a lot of fatigue has been endocrine doctor. Has missed work due to being so tired and no energy at all. )  Background: 68 y.o. male who has Tremor; Symptomatic cholelithiasis; Coronary arteriosclerosis in patient with history of previous myocardial infarction; Primary hypertension; Hypercholesteremia; Status post coronary artery stent placement; OSA (obstructive sleep apnea); Mass of neck; Arthralgia of right knee; Allergic rhinitis; Osteoarthritis of right knee; Male hypogonadism; Intolerance of continuous positive airway pressure (CPAP) ventilation; Other fatigue; ARB intolerance; and Statin intolerance on their problem list.  Discussed the use of AI scribe software for clinical note transcription with the patient, who gave verbal consent to proceed.  History of Present Illness 68 year old male with coronary artery disease and low testosterone who presents with fatigue and medication management.  He has been experiencing significant fatigue, describing it as 'not living, just existing.' This fatigue has led to missing two days of work and spending an entire weekend in bed. He attributes some of his fatigue to low testosterone levels and is awaiting insurance approval for testosterone cream. An MRI is scheduled to evaluate his pituitary gland.  He  experienced a myocardial infarction last October, initially presenting with chest pain and sweating. He was treated with two stents for significant blockages. Initially on Brilinta , he was switched to Effient  due to breathing problems. His current medications include aspirin , nitroglycerin , and omega supplements. He discontinued Repatha  due to fatigue but plans to restart it.  He has a history of low testosterone and has been receiving testosterone shots, which he believes contributed to his fatigue due to fluctuating levels.  He has a history of sleep apnea but has not been using a CPAP machine. He has tried his wife's machine but found it uncomfortable. He has made lifestyle adjustments such as exercising in the morning and using an ergonomic pillow to improve sleep quality.  He has high cholesterol and cannot tolerate statins due to muscle pain. He is taking omega supplements and fish oil to manage his cholesterol levels.  His current medications include albuterol, aspirin , vitamin D3, nitroglycerin , omega supplements, and Effient . Zyrtec and Brilinta  have been discontinued. No current use of CPAP machine for sleep apnea. Reports fatigue, low energy, and difficulty maintaining physical activity.  Has been fatigued after stopping testosterone injections.  History of transdermal.  Following with Dr. Balan.   Problem overviews updated today: Problem  Intolerance of Continuous Positive Airway Pressure (Cpap) Ventilation  Other Fatigue  Arb Intolerance  Statin Intolerance  Osa (Obstructive Sleep Apnea)  Male Hypogonadism  Primary Hypertension  Hypercholesteremia   Current Medications: Crestor  20 mg daily  Intolerances: Lipitor 80 mg - upset stomach  Risk Factors: recent STEMI s/p coronary stent, CAD, HLD, family hx of premature CAD - brother - 11 stents; 1st one in his early 73's  LDL goal: <55 mg/dl  Last lipid lab: LDLc 768, TC 351,TG 312, HDL 58  Status Post Coronary Artery Stent  Placement   2 stents   Coronary Arteriosclerosis in Patient With History of Previous Myocardial Infarction    Medications updated/reviewed: Current Outpatient Medications on File Prior to Visit  Medication Sig  . albuterol (VENTOLIN HFA) 108 (90 Base) MCG/ACT inhaler Inhale 2 puffs into the lungs as needed for wheezing or shortness of breath.  . aspirin  EC 81 MG tablet Take 1 tablet (81 mg total) by mouth daily. Swallow whole.  . Cholecalciferol (D3 PO) Take 1 tablet by mouth daily.  . icosapent  Ethyl (VASCEPA ) 1 g capsule Take 2 capsules (2 g total) by mouth 2 (two) times daily.  . nitroGLYCERIN  (NITROSTAT ) 0.4 MG SL tablet Place 1 tablet (0.4 mg total) under the tongue every 5 (five) minutes as needed for chest pain.  . Omega-3 Fatty Acids (OMEGA-3 FISH OIL) 500 MG CAPS Take 500 mg by mouth daily. Take 2 daily 1000 mg/day  . prasugrel  (EFFIENT ) 10 MG TABS tablet Take 1 tablet (10 mg total) by mouth daily.  . Evolocumab  (REPATHA  SURECLICK) 140 MG/ML SOAJ Inject 140 mg into the skin every 14 (fourteen) days.   No current facility-administered medications on file prior to visit.   Medications Discontinued During This Encounter  Medication Reason  . cetirizine (ZYRTEC) 10 MG tablet Completed Course  . ticagrelor  (BRILINTA ) 90 MG TABS tablet    Current Meds  Medication Sig  . albuterol (VENTOLIN HFA) 108 (90 Base) MCG/ACT inhaler Inhale 2 puffs into the lungs as needed for wheezing or shortness of breath.  . aspirin  EC 81 MG tablet Take 1 tablet (81 mg total) by mouth daily. Swallow whole.  . Cholecalciferol (D3 PO) Take 1 tablet by mouth daily.  . icosapent  Ethyl (VASCEPA ) 1 g capsule Take 2 capsules (2 g total) by mouth 2 (two) times daily.  . losartan  (COZAAR ) 25 MG tablet Take 0.5 tablets (12.5 mg total) by mouth daily.  . nitroGLYCERIN  (NITROSTAT ) 0.4 MG SL tablet Place 1 tablet (0.4 mg total) under the tongue every 5 (five) minutes as needed for chest pain.  . Omega-3 Fatty Acids  (OMEGA-3 FISH OIL) 500 MG CAPS Take 500 mg by mouth daily. Take 2 daily 1000 mg/day  . prasugrel  (EFFIENT ) 10 MG TABS tablet Take 1 tablet (10 mg total) by mouth daily.  . semaglutide-weight management (WEGOVY) 0.25 MG/0.5ML SOAJ SQ injection Inject 0.25 mg into the skin once a week.     Allergies:  Crestor  [rosuvastatin ] and Tequin [gatifloxacin] Past Medical History:  has a past medical history of CAD (coronary artery disease), HTN (hypertension), and Hyperlipidemia. Past Surgical History:   has a past surgical history that includes Hernia repair (08/24/2011); Knee arthroscopy (Right, 08/23/2000); Colonoscopy (N/A, 03/14/2015); Coronary/Graft Acute MI Revascularization (N/A, 05/27/2023); LEFT HEART CATH AND CORONARY ANGIOGRAPHY (N/A, 05/27/2023); and CORONARY STENT INTERVENTION (N/A, 05/30/2023). Social History:   reports that he has never smoked. He has never used smokeless tobacco. He reports that he does not drink alcohol and does not use drugs. Family History:  family history includes CAD in his brother; Cancer in his brother and father; Heart attack in his brother and mother. Depression Screen and Health Maintenance:    05/30/2024    8:38 AM 07/11/2023    7:57 AM  PHQ 2/9 Scores  PHQ - 2 Score 0 0  PHQ- 9 Score  3   Health Maintenance  Topic Date Due  . COVID-19 Vaccine (1) Never done  . Hepatitis C Screening  Never done  .  Pneumococcal Vaccine: 50+ Years (1 of 2 - PCV) Never done  . Zoster Vaccines- Shingrix (1 of 2) Never done  . Influenza Vaccine  03/23/2024  . Colonoscopy  03/13/2025  . DTaP/Tdap/Td (3 - Td or Tdap) 11/27/2030  . Meningococcal B Vaccine  Aged Out   Immunization History  Administered Date(s) Administered  . Influenza,inj,Quad PF,6+ Mos 05/31/2019  . Influenza,inj,quad, With Preservative 05/22/2018  . Tdap 03/02/2018, 11/26/2020     Objective   Physical ExamBP 136/82   Pulse 74   Temp 98 F (36.7 C) (Temporal)   Ht 5' 6 (1.676 m)   Wt 161 lb 12.8 oz  (73.4 kg)   SpO2 98%   BMI 26.12 kg/m  Wt Readings from Last 10 Encounters:  05/30/24 161 lb 12.8 oz (73.4 kg)  03/30/24 163 lb (73.9 kg)  12/09/23 158 lb 3.2 oz (71.8 kg)  11/30/23 147 lb 11.3 oz (67 kg)  06/27/23 147 lb 9.6 oz (67 kg)  06/13/23 145 lb (65.8 kg)  06/09/23 146 lb 6.4 oz (66.4 kg)  05/30/23 145 lb 1 oz (65.8 kg)  05/26/23 145 lb (65.8 kg)  10/31/22 145 lb (65.8 kg)  Vital signs reviewed.  Nursing notes reviewed. Weight trend reviewed. General Appearance:  Well developed, well nourished, well-groomed, healthy-appearing male with Body mass index is 26.12 kg/m. No acute distress appreciable.   Skin: Clear and well-hydrated. Pulmonary:  Normal work of breathing at rest, no respiratory distress apparent. SpO2: 98 %  Musculoskeletal: He demonstrates smooth and coordinated movements throughout all major joints.All extremities are intact.  Neurological:  Awake, alert, oriented, and engaged.  No obvious focal neurological deficits or cognitive impairments.  Sensorium seems unclouded.  Psychiatric:  Appropriate mood, pleasant and cooperative demeanor, cheerful and engaged during the exam  Reviewed Results & Data Results LABS Cholesterol: 155 mg/dL  DIAGNOSTIC ECG: Normal    Results for orders placed or performed in visit on 05/30/24  Lipid panel  Result Value Ref Range   Cholesterol 280 (H) 0 - 200 mg/dL   Triglycerides 846.9 (H) 0.0 - 149.0 mg/dL   HDL 46.79 >60.99 mg/dL   VLDL 69.3 0.0 - 59.9 mg/dL   LDL Cholesterol 803 (H) 0 - 99 mg/dL   Total CHOL/HDL Ratio 5    NonHDL 226.74   Comprehensive metabolic panel with GFR  Result Value Ref Range   Sodium 140 135 - 145 mEq/L   Potassium 5.1 3.5 - 5.1 mEq/L   Chloride 103 96 - 112 mEq/L   CO2 30 19 - 32 mEq/L   Glucose, Bld 117 (H) 70 - 99 mg/dL   BUN 16 6 - 23 mg/dL   Creatinine, Ser 8.85 0.40 - 1.50 mg/dL   Total Bilirubin 0.3 0.2 - 1.2 mg/dL   Alkaline Phosphatase 49 39 - 117 U/L   AST 22 0 - 37 U/L   ALT  39 0 - 53 U/L   Total Protein 7.1 6.0 - 8.3 g/dL   Albumin 4.8 3.5 - 5.2 g/dL   GFR 32.84 >39.99 mL/min   Calcium  9.7 8.4 - 10.5 mg/dL  CBC with Differential/Platelet  Result Value Ref Range   WBC 3.9 (L) 4.0 - 10.5 K/uL   RBC 5.51 4.22 - 5.81 Mil/uL   Hemoglobin 15.9 13.0 - 17.0 g/dL   HCT 51.3 60.9 - 47.9 %   MCV 88.2 78.0 - 100.0 fl   MCHC 32.6 30.0 - 36.0 g/dL   RDW 86.0 88.4 - 84.4 %   Platelets 232.0  150.0 - 400.0 K/uL   Neutrophils Relative % 51.3 43.0 - 77.0 %   Lymphocytes Relative 36.1 12.0 - 46.0 %   Monocytes Relative 9.7 3.0 - 12.0 %   Eosinophils Relative 1.5 0.0 - 5.0 %   Basophils Relative 1.4 0.0 - 3.0 %   Neutro Abs 2.0 1.4 - 7.7 K/uL   Lymphs Abs 1.4 0.7 - 4.0 K/uL   Monocytes Absolute 0.4 0.1 - 1.0 K/uL   Eosinophils Absolute 0.1 0.0 - 0.7 K/uL   Basophils Absolute 0.1 0.0 - 0.1 K/uL  TSH  Result Value Ref Range   TSH 2.26 0.35 - 5.50 uIU/mL  Testosterone  Result Value Ref Range   Testosterone 170.08 (L) 300.00 - 890.00 ng/dL    Office Visit on 89/91/7974  Component Date Value  . Cholesterol 05/30/2024 280 (H)   . Triglycerides 05/30/2024 153.0 (H)   . HDL 05/30/2024 53.20   . VLDL 05/30/2024 30.6   . LDL Cholesterol 05/30/2024 196 (H)   . Total CHOL/HDL Ratio 05/30/2024 5   . NonHDL 05/30/2024 226.74   . Sodium 05/30/2024 140   . Potassium 05/30/2024 5.1   . Chloride 05/30/2024 103   . CO2 05/30/2024 30   . Glucose, Bld 05/30/2024 117 (H)   . BUN 05/30/2024 16   . Creatinine, Ser 05/30/2024 1.14   . Total Bilirubin 05/30/2024 0.3   . Alkaline Phosphatase 05/30/2024 49   . AST 05/30/2024 22   . ALT 05/30/2024 39   . Total Protein 05/30/2024 7.1   . Albumin 05/30/2024 4.8   . GFR 05/30/2024 67.15   . Calcium  05/30/2024 9.7   . WBC 05/30/2024 3.9 (L)   . RBC 05/30/2024 5.51   . Hemoglobin 05/30/2024 15.9   . HCT 05/30/2024 48.6   . MCV 05/30/2024 88.2   . MCHC 05/30/2024 32.6   . RDW 05/30/2024 13.9   . Platelets 05/30/2024 232.0   .  Neutrophils Relative % 05/30/2024 51.3   . Lymphocytes Relative 05/30/2024 36.1   . Monocytes Relative 05/30/2024 9.7   . Eosinophils Relative 05/30/2024 1.5   . Basophils Relative 05/30/2024 1.4   . Neutro Abs 05/30/2024 2.0   . Lymphs Abs 05/30/2024 1.4   . Monocytes Absolute 05/30/2024 0.4   . Eosinophils Absolute 05/30/2024 0.1   . Basophils Absolute 05/30/2024 0.1   . TSH 05/30/2024 2.26   . Testosterone 05/30/2024 170.08 (L)   Scanned Document on 05/11/2024  Component Date Value  . TSH 03/13/2024 1.38   . EGFR 03/13/2024 98.0   . A1c 05/26/2023 5.6   . A1c 03/12/2024 5.7   Admission on 04/09/2024, Discharged on 04/09/2024  Component Date Value  . Color, UA 04/09/2024 yellow   . Clarity, UA 04/09/2024 clear   . Glucose, UA 04/09/2024 negative   . Bilirubin, UA 04/09/2024 negative   . Ketones, POC UA 04/09/2024 trace (5) (A)   . Spec Grav, UA 04/09/2024 1.025   . Blood, UA 04/09/2024 negative   . pH, UA 04/09/2024 5.5   . POC PROTEIN,UA 04/09/2024 negative   . Urobilinogen, UA 04/09/2024 0.2   . Nitrite, UA 04/09/2024 Negative   . Leukocytes, UA 04/09/2024 Negative   . WBC 04/09/2024 6.2   . RBC 04/09/2024 5.21   . Hemoglobin 04/09/2024 15.2   . Hematocrit 04/09/2024 48.0   . MCV 04/09/2024 92   . MCH 04/09/2024 29.2   . MCHC 04/09/2024 31.7   . RDW 04/09/2024 14.3   . Platelets 04/09/2024 267   .  Neutrophils 04/09/2024 54   . Lymphs 04/09/2024 31   . Monocytes 04/09/2024 12   . Eos 04/09/2024 2   . Basos 04/09/2024 1   . Neutrophils Absolute 04/09/2024 3.3   . Lymphocytes Absolute 04/09/2024 1.9   . Monocytes Absolute 04/09/2024 0.7   . EOS (ABSOLUTE) 04/09/2024 0.2   . Basophils Absolute 04/09/2024 0.1   . Immature Granulocytes 04/09/2024 0   . Immature Grans (Abs) 04/09/2024 0.0   . Glucose 04/09/2024 87   . BUN 04/09/2024 15   . Creatinine, Ser 04/09/2024 1.01   . eGFR 04/09/2024 82   . BUN/Creatinine Ratio 04/09/2024 15   . Sodium 04/09/2024 141   .  Potassium 04/09/2024 4.1   . Chloride 04/09/2024 102   . CO2 04/09/2024 23   . Calcium  04/09/2024 9.3   . Total Protein 04/09/2024 6.9   . Albumin 04/09/2024 4.6   . Globulin, Total 04/09/2024 2.3   . Bilirubin Total 04/09/2024 0.3   . Alkaline Phosphatase 04/09/2024 55   . AST 04/09/2024 23   . ALT 04/09/2024 33   . Lipase 04/09/2024 35   Office Visit on 01/27/2024  Component Date Value  . Cholesterol, Total 01/30/2024 179   . Triglycerides 01/30/2024 193 (H)   . HDL 01/30/2024 43   . VLDL Cholesterol Cal 01/30/2024 33   . LDL Chol Calc (NIH) 01/30/2024 103 (H)   . Chol/HDL Ratio 01/30/2024 4.2   Hospital Outpatient Visit on 12/23/2023  Component Date Value  . Platelet Function  P2Y12 12/23/2023 130 (L)   Telephone on 12/22/2023  Component Date Value  . Cholesterol, Total 12/23/2023 231 (H)   . Triglycerides 12/23/2023 178 (H)   . HDL 12/23/2023 43   . VLDL Cholesterol Cal 12/23/2023 33   . LDL Chol Calc (NIH) 12/23/2023 155 (H)   . Chol/HDL Ratio 12/23/2023 5.4 (H)   Orders Only on 12/15/2023  Component Date Value  . Total Protein 12/15/2023 6.5   . Albumin 12/15/2023 4.4   . Bilirubin Total 12/15/2023 <0.2   . Bilirubin, Direct 12/15/2023 0.09   . Alkaline Phosphatase 12/15/2023 67   . AST 12/15/2023 23   . ALT 12/15/2023 33   . Cholesterol, Total 12/15/2023 280 (H)   . Triglycerides 12/15/2023 711 (HH)   . HDL 12/15/2023 37 (L)   . VLDL Cholesterol Cal 12/15/2023 128 (H)   . LDL Chol Calc (NIH) 12/15/2023 115 (H)   . Chol/HDL Ratio 12/15/2023 7.6 (H)   Admission on 11/30/2023, Discharged on 12/01/2023  Component Date Value  . Sodium 11/30/2023 137   . Potassium 11/30/2023 3.9   . Chloride 11/30/2023 100   . CO2 11/30/2023 25   . Glucose, Bld 11/30/2023 113 (H)   . BUN 11/30/2023 17   . Creatinine, Ser 11/30/2023 0.93   . Calcium  11/30/2023 9.6   . GFR, Estimated 11/30/2023 >60   . Anion gap 11/30/2023 12   . WBC 11/30/2023 6.5   . RBC 11/30/2023 4.66    . Hemoglobin 11/30/2023 14.1   . HCT 11/30/2023 41.9   . MCV 11/30/2023 89.9   . MCH 11/30/2023 30.3   . MCHC 11/30/2023 33.7   . RDW 11/30/2023 12.8   . Platelets 11/30/2023 262   . nRBC 11/30/2023 0.0   . Troponin I (High Sensiti* 11/30/2023 5   . Troponin I (High Sensiti* 12/01/2023 5   . D-Dimer, Quant 12/01/2023 0.44   . Troponin I (High Sensiti* 12/01/2023 5   Admission on  11/15/2023, Discharged on 11/16/2023  Component Date Value  . Lipase 11/15/2023 34   . Sodium 11/15/2023 138   . Potassium 11/15/2023 3.9   . Chloride 11/15/2023 101   . CO2 11/15/2023 28   . Glucose, Bld 11/15/2023 123 (H)   . BUN 11/15/2023 22   . Creatinine, Ser 11/15/2023 1.06   . Calcium  11/15/2023 9.6   . Total Protein 11/15/2023 7.4   . Albumin 11/15/2023 4.5   . AST 11/15/2023 25   . ALT 11/15/2023 33   . Alkaline Phosphatase 11/15/2023 52   . Total Bilirubin 11/15/2023 0.5   . GFR, Estimated 11/15/2023 >60   . Anion gap 11/15/2023 9   . WBC 11/15/2023 6.7   . RBC 11/15/2023 4.93   . Hemoglobin 11/15/2023 14.6   . HCT 11/15/2023 44.5   . MCV 11/15/2023 90.3   . [facility] 11/15/2023 29.6   . MCHC 11/15/2023 32.8   . RDW 11/15/2023 13.2   . Platelets 11/15/2023 264   . nRBC 11/15/2023 0.0   . Color, Urine 11/15/2023 YELLOW   . APPearance 11/15/2023 CLEAR   . Specific Gravity, Urine 11/15/2023 1.021   . pH 11/15/2023 5.0   . Glucose, UA 11/15/2023 NEGATIVE   . Hgb urine dipstick 11/15/2023 NEGATIVE   . Bilirubin Urine 11/15/2023 NEGATIVE   . Ketones, ur 11/15/2023 NEGATIVE   . Protein, ur 11/15/2023 NEGATIVE   . Nitrite 11/15/2023 NEGATIVE   . Leukocytes,Ua 11/15/2023 NEGATIVE   . Troponin I (High Sensiti* 11/15/2023 4   Appointment on 06/15/2023  Component Date Value  . Area-P 1/2 06/15/2023 4.31   . S' Lateral 06/15/2023 3.00   . Radius 06/15/2023 0.70   . MV M vel 06/15/2023 4.10   . MV Peak grad 06/15/2023 67.2   . Est EF 06/15/2023 55 - 60%   There may be more visits with  results that are not included.   No image results found.   No results found.  No results found.    ASSESSMENT & PLAN   Assessment & Plan Status post coronary artery stent placement Coronary arteriosclerosis in patient with history of previous myocardial infarction Statin intolerance He has a history of myocardial infarction from last October with subsequent stent placement. Current management includes medications and lifestyle modifications to prevent further cardiac events. Georjean is expected to reduce the risk of another myocardial infarction by approximately 30%, lowering the risk from 8% to around 5.5-6%. Prescribe [id number] for cardiovascular risk reduction and weight loss. Recommend cardiac rehabilitation. Advise on dietary intake of omega-3 and omega-9 fatty acids, including fish oil, extra virgin olive oil, and avocado. Encourage regular exercise within cardiologist's recommendations.  He has hyperlipidemia with intolerance to statins due to muscle pain. Current LDL levels are high, contributing to cardiovascular risk. Repatha  was previously stopped due to fatigue concerns. Restart Repatha  if fatigue does not improve without it. Check cholesterol levels with blood work. ARB intolerance ACE inhibitor intolerance Primary hypertension His hypertension is not at goal, with previous low blood pressure episodes on medication. Prescribe a low dose of blood pressure medication with instructions to take half a tablet. Monitor blood pressure, especially with exercise.  Prior intolerance to ace/arb seems due to to dosing related intolerance Other fatigue Chronic fatigue impacts daily activities, possibly related to low testosterone, heart damage, or sleep apnea. Fatigue worsens with physical activity. Testosterone gel therapy is expected to provide steady state levels and reduce cardiovascular risk. Sleep apnea and thyroid  function are also considered potential  contributors. Start testosterone gel  therapy. Order blood work to assess thyroid  function, blood counts, cholesterol, liver, and kidney function. Check testosterone levels. Refer to cardiologist for exercise recommendations. Intolerance of continuous positive airway pressure (CPAP) ventilation OSA (obstructive sleep apnea) Suspected obstructive sleep apnea contributes to fatigue. He has not tolerated CPAP in the past. Procedural intervention may be necessary to improve sleep quality and reduce fatigue. Refer to ENT for evaluation and potential procedural intervention for sleep apnea. Male hypogonadism Low testosterone levels contribute to fatigue. Previous testosterone injections caused fluctuations in energy levels. Discussed benefits of testosterone gel for steady state levels and reduced cardiovascular risk. Start testosterone gel therapy. Discuss GoodRx options for obtaining testosterone gel at a lower cost.  ORDER ASSOCIATIONS  #   DIAGNOSIS / CONDITION ICD-10 ENCOUNTER ORDER     ICD-10-CM   1. Status post coronary artery stent placement  Z95.5     2. Statin intolerance  Z78.9     3. ACE inhibitor intolerance  Z78.9     4. ARB intolerance  Z78.9     5. Coronary arteriosclerosis in patient with history of previous myocardial infarction  I25.10 semaglutide-weight management (WEGOVY) 0.25 MG/0.5ML SOAJ SQ injection   I25.2 losartan  (COZAAR ) 25 MG tablet    Lipid panel    6. Other fatigue  R53.83 Comprehensive metabolic panel with GFR    CBC with Differential/Platelet    TSH    Testosterone    7. Primary hypertension  I10 losartan  (COZAAR ) 25 MG tablet    Comprehensive metabolic panel with GFR    CBC with Differential/Platelet    TSH    Testosterone    8. OSA (obstructive sleep apnea)  G47.33 Ambulatory referral to ENT    9. Intolerance of continuous positive airway pressure (CPAP) ventilation  Z78.9     10. Male hypogonadism  E29.1      ED Discharge Orders          Ordered    semaglutide-weight management  (WEGOVY) 0.25 MG/0.5ML SOAJ SQ injection  Weekly        05/30/24 0928    losartan  (COZAAR ) 25 MG tablet  Daily        05/30/24 0928    Lipid panel       Comments: Specimen [id number]: [place]    05/30/24 0928    Comprehensive metabolic panel with GFR        05/30/24 0928    CBC with Differential/Platelet        05/30/24 0928    TSH        05/30/24 0928    Testosterone        05/30/24 0928    Ambulatory referral to ENT        05/30/24 0928          Diagnoses and all orders for this visit: Status post coronary artery stent placement Statin intolerance ACE inhibitor intolerance ARB intolerance Coronary arteriosclerosis in patient with history of previous myocardial infarction -     semaglutide-weight management (WEGOVY) 0.25 MG/0.5ML SOAJ SQ injection; Inject 0.25 mg into the skin once a week. -     losartan  (COZAAR ) 25 MG tablet; Take 0.5 tablets (12.5 mg total) by mouth daily. -     Lipid panel Other fatigue -     Comprehensive metabolic panel with GFR -     CBC with Differential/Platelet -     TSH -     Testosterone Primary hypertension -     losartan  (COZAAR )  25 MG tablet; Take 0.5 tablets (12.5 mg total) by mouth daily. -     Comprehensive metabolic panel with GFR -     CBC with Differential/Platelet -     TSH -     Testosterone OSA (obstructive sleep apnea) -     Ambulatory referral to ENT Intolerance of continuous positive airway pressure (CPAP) ventilation Male hypogonadism     Recommended follow up: Return in about 3 months (around 08/30/2024), or 1 month preferred, for fu bp  med, wegovy, fatigue. Future Appointments  Date Time Provider Department Center  06/07/2024  5:30 PM [facility]  09/05/2024  3:20 PM Jesus Bernardino MATSU, MD [facility]             Additional notes: This document was synthesized by artificial intelligence (Abridge) using HIPAA-compliant recording of the clinical interaction;   We discussed the  use of AI scribe software for clinical note transcription with the patient, who gave verbal consent to proceed.    Additional Info: This encounter employed state-of-the-art, real-time, collaborative documentation. The patient actively reviewed and assisted in updating their electronic medical record on a shared screen, ensuring transparency and facilitating joint problem-solving for the problem list, overview, and plan. This approach promotes accurate, informed care. The treatment plan was discussed and reviewed in detail, including medication safety, potential side effects, and all patient questions. We confirmed understanding and comfort with the plan. Follow-up instructions were established, including contacting the office for any concerns, returning if symptoms worsen, persist, or new symptoms develop, and precautions for potential emergency department visits.  Initial Appointment Goals:  This initial visit focused on establishing a foundation for the patient's care. We collaboratively reviewed his medical history and medications in detail, updating the chart as shown in the encounter. 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 Has missed work due to being so tired and no energy at all. )  While the complexity of the patient's medical picture may necessitate further evaluation in subsequent visits, we were able to develop a preliminary care plan together. To expedite a comprehensive plan at the next visit, we encouraged the patient to gather relevant medical records from previous providers. This collaborative approach will ensure a more complete understanding of the patient's health and inform the development of a personalized care plan. We look forward to continuing the conversation and working together with the patient on  achieving his health goals.   Collaborative Documentation:  Today's encounter utilized real-time, dynamic patient engagement.  Patients actively participate by directly reviewing and assisting in updating their medical records through a shared screen. This transparency empowers patients to visually confirm chart updates made by the healthcare provider.  This collaborative approach facilitates problem management as we jointly update the problem list, problem overview, and assessment/plan. Ultimately, this process enhances chart accuracy and completeness, fostering shared decision-making, patient education, and informed consent for tests and treatments.  Collaborative Treatment Planning:  Treatment plans were discussed and reviewed in detail.  Explained medication safety and potential side effects.  Encouraged participation and answered all patient questions, confirming understanding and comfort with the plan. Encouraged patient to contact our office if they have any questions or concerns. Agreed on patient returning to office if symptoms worsen, persist, or new symptoms develop.  ----------------------------------------------------- Bernardino MATSU  Jesus, MD  05/30/2024 5:48 PM  [facility] at [facility]:  [phone]      STEMI Secondary Prevention Authorization  To Whom It May Concern,  I am requesting prior authorization for evidence-based secondary prevention therapies for my patient with a history of ST-elevation myocardial infarction (STEMI). The patient's current medication regimen includes albuterol (Ventolin HFA), aspirin  (enteric-coated), cholecalciferol (vitamin D3), icosapent  ethyl (Vascepa ), nitroglycerin  (Nitrostat ), omega-3 fatty acids (fish oil), prasugrel  (Effient ), and evolocumab  (Repatha  SureClick). The patient's body mass index (BMI) is 26.12.  Current Regimen and Rationale:  - The patient is on dual antiplatelet therapy (aspirin , prasugrel ), high-intensity  lipid-lowering therapy (evolocumab , icosapent  ethyl, omega-3 fatty acids), and antianginal therapy (nitroglycerin ), consistent with guideline-directed secondary prevention for ASCVD.[1][2][3][4][5]  - Evolocumab  is FDA-approved to reduce major adverse cardiovascular events in adults with established cardiovascular disease.[1]  - The regimen addresses multiple risk factors, but the patient is not currently on a statin or a renin-angiotensin-aldosterone system (RAAS) inhibitor, which are recommended for all post-MI patients unless contraindicated.[2][3][4][5]  Consideration of Semaglutide:  - Semaglutide, a GLP-1 receptor agonist, has demonstrated a significant reduction in major adverse cardiovascular events (MACE) in patients with established ASCVD and overweight/obesity, regardless of diabetes status.[6][7][8]  - SELECT trial data show a 20% reduction in MACE with semaglutide 2.4 mg weekly in patients with BMI >=27 and preexisting cardiovascular disease, including those without diabetes.[6][7][8]  - FDA labeling for semaglutide (Ozempic) currently supports use for cardiovascular risk reduction in adults with type 2 diabetes and established cardiovascular disease. For non-diabetic patients, the SELECT trial criteria require BMI >=27.[1][6][7][8][9]  - This patient's BMI is 26.12, which is below the threshold for semaglutide eligibility in non-diabetic ASCVD patients per current evidence and regulatory guidance. Should the patient's BMI increase to >=27, semaglutide would be indicated for secondary prevention.[6][7][8][9]  Summary and Request:  - The patient's regimen is consistent with current guidelines for secondary prevention after STEMI, but additional therapies (statin, RAAS inhibitor) should be considered if not contraindicated.[2][3][4][5]  - Semaglutide is not currently indicated for this patient due to BMI <27, but eligibility should be reassessed if BMI increases or if new  evidence/guidance emerges.[6][7][8][9]  - Please approve continued coverage for the current regimen and consider future eligibility for semaglutide should the patient meet criteria.  If further documentation or clarification is required, please contact my office.  Sincerely,  [Prescriber Name]  [Practice Name]  [employer] Information]  [Date] References FDA [employer]. FDA Orange Book. 2025 ACC/AHA/ACEP/NAEMSP/SCAI Guideline for the Management of Patients With Acute Coronary Syndromes: A Report of the American College of Cardiology/American Heart Association Joint Committee on Clinical Practice Guidelines. Melanee RAMAL, O'Donoghue ML, Ruel M, et al. Journal of the [employer] of Cardiology. 2025;:S0735-1097(24)10424-X. doi:10.1016/j.jacc.2024.11.009. Contemporary Diagnosis and Management of Patients With Myocardial Infarction in the Absence of Obstructive Coronary Artery Disease: A Scientific Statement From the American Heart Association. Tamis-Holland JE, Jneid H, Reynolds HR, et al. Circulation. 2019;139(18):e891-e908. doi:10.1161/CIR.0000000000000670. Acute Coronary Syndromes. Bergmark BA, Mathenge N, Merlini PA, Lawrence-Wright MB, [place]. Lancet Vick, [place]). 2022;399(10332):1347-1358. doi:10.1016/S0140-6736(21)02391-6. Key Concepts in Cardiovascular Secondary Prevention: A Case-Based Review. Revankar S, Shakra N, DiMaio JM, Agarwala A. The American Journal of Cardiology. 7974;751:67-59. doi:10.1016/j.amjcard.2025.03.035. Semaglutide and Cardiovascular Outcomes in Obesity without Diabetes. Lincoff AM, Sudie POUR, Colhoun HM, et al. The [place] Journal of Medicine. 2023;389(24):2221-2232. doi:10.1056/NEJMoa2307563. Semaglutide and Cardiovascular Outcomes by Baseline HbA1c and Change in HbA1c in People With Overweight or Obesity but Without Diabetes in SELECT. Angelyn LILLETTE Dollene JINNY Deretha GRAIG,  et al. Diabetes Care. 2024;47(8):1360-1369. doi:10.2337/dc24-0764. Obesity  Management in Adults: A Review. Elmaleh-Sachs A, Arlana RUSH, Bramante CT, et al. JAMA. 2023;330(20):2000-2015. doi:10.1001/jama.7976.80102. Eligibility for and Practical Implications of Semaglutide in Overweight and Obese Patients With Acute Coronary Syndrome. De Sio V, Gragnano F, Capolongo A, et al. International Journal of Cardiology. [phone]. doi:10.1016/j.ijcard.7974.866971.     Table Cell Structures & Border Applications    1. Basic Collapsed Borders   border-collapse: collapse with uniform 1px borders   Header 1 Header 2 Header 3  Cell 1-1 Cell 1-2 Cell 1-3  Cell 2-1 Cell 2-2 Cell 2-3       2. Separated Borders   border-collapse: separate with border-spacing: 10px   Cell 1-1 Cell 1-2 Cell 1-3  Cell 2-1 Cell 2-2 Cell 2-3       3. Individual Border Sides   Different borders on each side of cells   Top border (blue) Right border (red) Bottom border (orange)  Left border (purple) Top & bottom Left & right       4. Border Styles   Different CSS border-style values   Solid Dashed Dotted  Double [place]       5. Colspan and Rowspan   Merged cells using colspan and rowspan attributes   Header spanning 3 columns  Spans 2 rows Cell 1-2 Cell 1-3  Spans 2 columns  Cell 3-1 Cell 3-2 Cell 3-3       6. Mixed Border Widths   Different border thicknesses in the same table   Thin (1px) Medium (3px) Thick (5px)  Thick (5px) Thin (1px) Medium (3px)       7. Outer Border Only   Border on table element, none on cells   Cell 1-1 Cell 1-2 Cell 1-3  Cell 2-1 Cell 2-2 Cell 2-3       8. Horizontal Borders Only   No vertical borders between columns   Cell 1-1 Cell 1-2 Cell 1-3  Cell 2-1 Cell 2-2 Cell 2-3  Cell 3-1 Cell 3-2 Cell 3-3       9. Colored Cells with Borders   Background colors with contrasting borders   Pink Purple Indigo  Cyan Green Orange      Table [employer] 1. Basic Collapsed  Borders border-collapse: collapse with uniform 1px borders Header 1 Header 2 Header 3  Cell 1-1 Cell 1-2 Cell 1-3  Cell 2-1 Cell 2-2 Cell 2-3  2. Separated Borders border-collapse: separate with border-spacing: 10px Cell 1-1 Cell 1-2 Cell 1-3  Cell 2-1 Cell 2-2 Cell 2-3  3. Individual Border Sides Different borders on each side of cells Top border (blue) Right border (red) Bottom border (orange)  Left border (purple) Top & bottom Left & right  4. Border Styles Different CSS border-style values Solid Dashed Dotted  Double [place]  5. Colspan and Rowspan Merged cells using colspan and rowspan attributes Header spanning 3 columns  Spans 2 rows Cell 1-2 Cell 1-3   Spans 2 columns  Cell 3-1 Cell 3-2 Cell 3-3  6. Mixed Border Widths Different border thicknesses in the same table Thin (1px) Medium (3px) Thick (5px)  Thick (5px) Thin (1px) Medium (3px)  7. Outer Border Only Border on table element, none on cells Cell 1-1 Cell 1-2 Cell 1-3  Cell 2-1 Cell 2-2 Cell 2-3  8. Horizontal Borders Only No vertical borders between columns Cell 1-1 Cell 1-2 Cell 1-3  Cell 2-1 Cell 2-2 Cell 2-3  Cell 3-1 Cell 3-2 Cell  3-3  9. Colored Cells with Borders Background colors with contrasting borders Pink Purple Indigo  Cyan [employer]

## 2024-05-30 NOTE — Telephone Encounter (Signed)
 Pharmacy Patient Advocate Encounter   Received notification from Pt Calls Messages that prior authorization for Wegovy 0.25MG /0.5ML Auto-injectors is required/requested.   Insurance verification completed.   The patient is insured through [employer].    Per test claim, medication is not covered due to plan/benefit exclusion, PA not submitted at this time

## 2024-05-30 NOTE — Patient Instructions (Addendum)
 Eat fish, Extra Virgin [employer], avocados.  It was a pleasure seeing you today! Your health and satisfaction are our top priorities.  Bernardino Cone, MD  VISIT SUMMARY: During today's visit, we discussed your ongoing fatigue and reviewed your current health conditions, including coronary artery disease, hypertension, hyperlipidemia, low testosterone, and suspected sleep apnea. We also addressed your medication management and lifestyle modifications to improve your overall health.  YOUR PLAN: -CORONARY ARTERY DISEASE WITH PRIOR MYOCARDIAL INFARCTION AND STENT PLACEMENT: Coronary artery disease is a condition where the blood vessels supplying your heart are narrowed or blocked. We will start you on Wegovy to reduce your risk of another heart attack and help with weight loss. Cardiac rehabilitation is recommended, and you should continue with a diet rich in omega-3 and omega-9 fatty acids and regular exercise as advised by your cardiologist.  -HYPERTENSION: Hypertension, or high blood pressure, needs to be controlled to prevent further heart issues. We will start you on a low dose of blood pressure medication, and you should take half a tablet as instructed. Please monitor your blood pressure, especially when exercising.  -HYPERLIPIDEMIA WITH STATIN INTOLERANCE: Hyperlipidemia means you have high levels of cholesterol in your blood, which increases your risk of heart disease. Since you cannot tolerate statins, we will restart Repatha  if your fatigue does not improve without it. We will also check your cholesterol levels with blood work.  -FATIGUE: Your chronic fatigue may be due to low testosterone, heart damage, or sleep apnea. We will start you on testosterone gel therapy and order blood work to check your thyroid  function, blood counts, cholesterol, liver, and kidney function. We will also check your testosterone levels and refer you to a cardiologist for exercise recommendations.  -TESTOSTERONE  DEFICIENCY: Low testosterone levels can cause fatigue and low energy. We will start you on testosterone gel therapy, which should provide more stable levels and reduce cardiovascular risk. We discussed using GoodRx to find a lower-cost option for the gel.  -OBSTRUCTIVE SLEEP APNEA (SUSPECTED): Obstructive sleep apnea is a condition where your breathing stops and starts during sleep, leading to poor sleep quality and fatigue. Since you have not tolerated CPAP, we will refer you to an ENT specialist to explore other treatment options.  -OVERWEIGHT: Being overweight can increase your risk of heart disease. We will start you on Wegovy to help with weight loss and reduce your cardiovascular risk.  INSTRUCTIONS: Please follow up with the cardiologist for exercise recommendations and with the ENT specialist for sleep apnea evaluation. Continue monitoring your blood pressure and cholesterol levels, and complete the blood work as ordered. Start the prescribed medications as discussed.  Your Providers PCP: Cone Bernardino MATSU, MD,  [phone]) Care Team Provider: Tommas Pears, MD,  [phone]) Care Team Provider: Verlin Lonni BIRCH, MD,  [phone]) Care Team Provider: Mavis Purchase, MD,  [phone])  NEXT STEPS: [x]  Early Intervention: Schedule sooner appointment, call our on-call services, or go to emergency room if there is any significant Increase in pain or discomfort New or worsening symptoms Sudden or severe changes in your health [x]  Flexible Follow-Up: We recommend a Return in about 3 months (around 08/30/2024), or 1 month preferred, for fu bp  med, wegovy, fatigue. for optimal routine care. This allows for progress monitoring and treatment adjustments. [x]  Preventive Care: Schedule your annual preventive care visit! It's typically covered by insurance and helps identify potential health issues early. [x]  Lab & X-ray Appointments: Incomplete tests scheduled today, or call to  schedule. X-rays: [facility] (M-F, 8:30am-noon or 1pm-5pm). [x]  Medical Information Release: Sign a release form at front desk to obtain relevant medical information we don't have.  MAKING THE MOST OF OUR FOCUSED 20 MINUTE APPOINTMENTS: [x]   Clearly state your top concerns at the beginning of the visit to focus our discussion [x]   If you anticipate you will need more time, please inform the front desk during scheduling - we can book multiple appointments in the same week. [x]   If you have transportation problems- use our convenient video appointments or ask about transportation support. [x]   We can get down to business faster if you use MyChart to update information before the visit and submit non-urgent questions before your visit. Thank you for taking the time to provide details through MyChart.  Let our nurse know and she can import this information into your encounter documents.  Arrival and Wait Times: [x]   Arriving on time ensures that everyone receives prompt attention. [x]   Early morning (8a) and afternoon (1p) appointments tend to have shortest wait times. [x]   Unfortunately, we cannot delay appointments for late arrivals or hold slots during phone calls.  Getting Answers and Following Up [x]   Simple Questions & Concerns: For quick questions or basic follow-up after your visit, reach us  at [phone] or MyChart messaging. [x]   Complex Concerns: If your concern is more complex, scheduling an appointment might be best. Discuss this with the staff to find the most suitable option. [x]   Lab & Imaging Results: We'll contact you directly if results are abnormal or you don't use MyChart. Most normal results will be on MyChart within 2-3 business days, with a review message from Dr. Jesus. Haven't heard back in 2 weeks? Need results sooner? Contact us  at [phone]. [x]   Referrals: Our referral coordinator will manage specialist referrals. The specialist's office  should contact you within 2 weeks to schedule an appointment. Call us  if you haven't heard from them after 2 weeks.  Staying Connected [x]   MyChart: Activate your MyChart for the fastest way to access results and message us . See the last page of this paperwork for instructions on how to activate.  Bring to Your Next Appointment [x]   Medications: Please bring all your medication bottles to your next appointment to ensure we have an accurate record of your prescriptions. [x]   Health Diaries: If you're monitoring any health conditions at home, keeping a diary of your readings can be very helpful for discussions at your next appointment.  Billing [x]   X-ray & Lab Orders: These are billed by separate companies. Contact the invoicing company directly for questions or concerns. [x]   Visit Charges: Discuss any billing inquiries with our administrative services team.  Your Satisfaction Matters [x]   Share Your Experience: We strive for your satisfaction! If you have any complaints, or preferably compliments, please let Dr. Jesus know directly or contact our Practice Administrators, Manuelita Rubin or [employer], by asking at the front desk.   Reviewing Your Records [x]   Review this early draft of your clinical encounter notes below and the final encounter summary tomorrow on MyChart after its been completed.  All orders placed so far are visible here: Status post coronary artery stent placement Assessment & Plan: He has a history of myocardial infarction from last October with subsequent stent placement. Current management includes medications and lifestyle modifications to prevent further cardiac events. Georjean is expected to reduce the risk of another myocardial infarction by approximately 30%, lowering the risk from 8% to  around 5.5-6%. Prescribe [id number] for cardiovascular risk reduction and weight loss. Recommend cardiac rehabilitation. Advise on dietary intake of omega-3 and omega-9 fatty acids,  including fish oil, extra virgin olive oil, and avocado. Encourage regular exercise within cardiologist's recommendations.  He has hyperlipidemia with intolerance to statins due to muscle pain. Current LDL levels are high, contributing to cardiovascular risk. Repatha  was previously stopped due to fatigue concerns. Restart Repatha  if fatigue does not improve without it. Check cholesterol levels with blood work.   Coronary arteriosclerosis in patient with history of previous myocardial infarction Assessment & Plan: He has a history of myocardial infarction from last October with subsequent stent placement. Current management includes medications and lifestyle modifications to prevent further cardiac events. Georjean is expected to reduce the risk of another myocardial infarction by approximately 30%, lowering the risk from 8% to around 5.5-6%. Prescribe [id number] for cardiovascular risk reduction and weight loss. Recommend cardiac rehabilitation. Advise on dietary intake of omega-3 and omega-9 fatty acids, including fish oil, extra virgin olive oil, and avocado. Encourage regular exercise within cardiologist's recommendations.  He has hyperlipidemia with intolerance to statins due to muscle pain. Current LDL levels are high, contributing to cardiovascular risk. Repatha  was previously stopped due to fatigue concerns. Restart Repatha  if fatigue does not improve without it. Check cholesterol levels with blood work.  Orders: -     Wegovy; Inject 0.25 mg into the skin once a week.  Dispense: 2 mL; Refill: 2 -     Losartan  Potassium; Take 0.5 tablets (12.5 mg total) by mouth daily.  Dispense: 45 tablet; Refill: 3 -     Lipid panel  Statin intolerance Assessment & Plan: He has a history of myocardial infarction from last October with subsequent stent placement. Current management includes medications and lifestyle modifications to prevent further cardiac events. Georjean is expected to reduce the risk of another  myocardial infarction by approximately 30%, lowering the risk from 8% to around 5.5-6%. Prescribe [id number] for cardiovascular risk reduction and weight loss. Recommend cardiac rehabilitation. Advise on dietary intake of omega-3 and omega-9 fatty acids, including fish oil, extra virgin olive oil, and avocado. Encourage regular exercise within cardiologist's recommendations.  He has hyperlipidemia with intolerance to statins due to muscle pain. Current LDL levels are high, contributing to cardiovascular risk. Repatha  was previously stopped due to fatigue concerns. Restart Repatha  if fatigue does not improve without it. Check cholesterol levels with blood work.   ARB intolerance Assessment & Plan: His hypertension is not at goal, with previous low blood pressure episodes on medication. Prescribe a low dose of blood pressure medication with instructions to take half a tablet. Monitor blood pressure, especially with exercise.  Prior intolerance to ace/arb seems due to to dosing related intolerance   ACE inhibitor intolerance  Primary hypertension Assessment & Plan: His hypertension is not at goal, with previous low blood pressure episodes on medication. Prescribe a low dose of blood pressure medication with instructions to take half a tablet. Monitor blood pressure, especially with exercise.  Prior intolerance to ace/arb seems due to to dosing related intolerance  Orders: -     Losartan  Potassium; Take 0.5 tablets (12.5 mg total) by mouth daily.  Dispense: 45 tablet; Refill: 3 -     Comprehensive metabolic panel with GFR -     CBC with Differential/Platelet -     TSH -     Testosterone  Other fatigue Assessment & Plan: Chronic fatigue impacts daily activities, possibly related to low testosterone, heart damage,  or sleep apnea. Fatigue worsens with physical activity. Testosterone gel therapy is expected to provide steady state levels and reduce cardiovascular risk. Sleep apnea and thyroid  function  are also considered potential contributors. Start testosterone gel therapy. Order blood work to assess thyroid  function, blood counts, cholesterol, liver, and kidney function. Check testosterone levels. Refer to cardiologist for exercise recommendations.  Orders: -     Comprehensive metabolic panel with GFR -     CBC with Differential/Platelet -     TSH -     Testosterone  Intolerance of continuous positive airway pressure (CPAP) ventilation Assessment & Plan: Suspected obstructive sleep apnea contributes to fatigue. He has not tolerated CPAP in the past. Procedural intervention may be necessary to improve sleep quality and reduce fatigue. Refer to ENT for evaluation and potential procedural intervention for sleep apnea.   OSA (obstructive sleep apnea) Assessment & Plan: Suspected obstructive sleep apnea contributes to fatigue. He has not tolerated CPAP in the past. Procedural intervention may be necessary to improve sleep quality and reduce fatigue. Refer to ENT for evaluation and potential procedural intervention for sleep apnea.  Orders: -     Ambulatory referral to ENT  Male hypogonadism Assessment & Plan: Low testosterone levels contribute to fatigue. Previous testosterone injections caused fluctuations in energy levels. Discussed benefits of testosterone gel for steady state levels and reduced cardiovascular risk. Start testosterone gel therapy. Discuss GoodRx options for obtaining testosterone gel at a lower cost.

## 2024-05-31 ENCOUNTER — Encounter: Payer: Self-pay | Admitting: Internal Medicine

## 2024-05-31 NOTE — Telephone Encounter (Signed)
 Tried to call pt about medication left message to call office back any more question on what step he would like to do next. OR he could write via my chart.

## 2024-06-03 ENCOUNTER — Ambulatory Visit: Payer: Self-pay | Admitting: Internal Medicine

## 2024-06-04 ENCOUNTER — Other Ambulatory Visit (HOSPITAL_COMMUNITY): Payer: Self-pay

## 2024-06-04 NOTE — Telephone Encounter (Signed)
 read by Belvie JINNY Bethena Bruna at 3:32PM on 06/03/2024.

## 2024-06-06 NOTE — Telephone Encounter (Signed)
 Pharmacy Patient Advocate Encounter  Received notification from [facility] that Prior Authorization for [facility] 0.25MG /0.5ML Auto-injectors  has been DENIED.  Full denial letter will be uploaded to the media tab. See denial reason below.

## 2024-06-07 ENCOUNTER — Ambulatory Visit
Admission: RE | Admit: 2024-06-07 | Discharge: 2024-06-07 | Disposition: A | Discharge status: Home / Self Care | Payer: PRIVATE HEALTH INSURANCE | Source: Ambulatory Visit | Attending: Endocrinology | Admitting: Endocrinology

## 2024-06-07 DIAGNOSIS — E291 Testicular hypofunction: Secondary | ICD-10-CM

## 2024-06-07 MED ORDER — GADOPICLENOL 0.5 MMOL/ML IV SOLN
7.0000 mL | Freq: Once | INTRAVENOUS | Status: AC | PRN
  Administered 2024-06-07: 7 mL via INTRAVENOUS

## 2024-06-08 ENCOUNTER — Encounter: Payer: Self-pay | Admitting: Internal Medicine

## 2024-07-02 ENCOUNTER — Ambulatory Visit: Payer: PRIVATE HEALTH INSURANCE | Admitting: Family Medicine

## 2024-07-03 ENCOUNTER — Ambulatory Visit
Admission: EM | Admit: 2024-07-03 | Discharge: 2024-07-03 | Disposition: A | Discharge status: Home / Self Care | Payer: PRIVATE HEALTH INSURANCE | Attending: Family Medicine | Admitting: Family Medicine

## 2024-07-03 ENCOUNTER — Other Ambulatory Visit (HOSPITAL_BASED_OUTPATIENT_CLINIC_OR_DEPARTMENT_OTHER): Payer: Self-pay

## 2024-07-03 ENCOUNTER — Encounter: Payer: Self-pay | Admitting: Internal Medicine

## 2024-07-03 DIAGNOSIS — J4521 Mild intermittent asthma with (acute) exacerbation: Secondary | ICD-10-CM

## 2024-07-03 DIAGNOSIS — J069 Acute upper respiratory infection, unspecified: Secondary | ICD-10-CM | POA: Diagnosis not present

## 2024-07-03 MED ORDER — IPRATROPIUM-ALBUTEROL 0.5-2.5 (3) MG/3ML IN SOLN
3.0000 mL | Freq: Once | RESPIRATORY_TRACT | Status: AC
  Administered 2024-07-03: 3 mL via RESPIRATORY_TRACT

## 2024-07-03 MED ORDER — PROMETHAZINE-DM 6.25-15 MG/5ML PO SYRP
5.0000 mL | ORAL_SOLUTION | Freq: Four times a day (QID) | ORAL | 0 refills | Status: DC | PRN
  Filled 2024-07-03: qty 100, 5d supply, fill #0

## 2024-07-03 MED ORDER — ALBUTEROL SULFATE HFA 108 (90 BASE) MCG/ACT IN AERS
2.0000 | INHALATION_SPRAY | RESPIRATORY_TRACT | 0 refills | Status: AC | PRN
  Filled 2024-07-03: qty 6.7, 17d supply, fill #0

## 2024-07-03 MED ORDER — PREDNISONE 20 MG PO TABS
40.0000 mg | ORAL_TABLET | Freq: Every day | ORAL | 0 refills | Status: DC
  Filled 2024-07-03: qty 6, 3d supply, fill #0

## 2024-07-03 NOTE — ED Triage Notes (Signed)
 Pt reports he is fatigue due to not having his testosterone meds and has a cough and chest congestion x 3 days    Used ivermectin and vic

## 2024-07-03 NOTE — ED Provider Notes (Signed)
 [facility]    CSN: [id number] Arrival date & time: 07/03/24  9183      History   Chief Complaint No chief complaint on file.   HPI Juan Schultz is a 68 y.o. male.   Patient presenting today with 3-day history of mild cough, congestion.  Denies fever, chills, chest pain, shortness of breath, abdominal pain, vomiting, diarrhea.  He is also complaining of fatigue but thinks this is more related to a lapse in his testosterone injections due to pharmacy issues which he is following up with his endocrinologist regarding.  He states he would like to know for symptoms in the blood as he tends to end up with bronchitis.  Does endorse when asked a history of asthma, states his albuterol inhaler is about 66 years old and he rarely uses it.  So far trying ivermectin and Vicks vapor rub with minimal relief.    Past Medical History:  Diagnosis Date   CAD (coronary artery disease)    HTN (hypertension)    Hyperlipidemia     Patient Active Problem List   Diagnosis Date Noted   Intolerance of continuous positive airway pressure (CPAP) ventilation 05/30/2024   Other fatigue 05/30/2024   ARB intolerance 05/30/2024   Statin intolerance 05/30/2024   OSA (obstructive sleep apnea) 01/27/2024   Mass of neck 01/27/2024   Allergic rhinitis 01/27/2024   Male hypogonadism 01/27/2024   Primary hypertension 05/30/2023   Hypercholesteremia 05/30/2023   Status post coronary artery stent placement 05/28/2023   Coronary arteriosclerosis in patient with history of previous myocardial infarction 05/27/2023   Symptomatic cholelithiasis 10/31/2022   Arthralgia of right knee 03/08/2022   Osteoarthritis of right knee 03/08/2022   Tremor 01/01/2015    Past Surgical History:  Procedure Laterality Date   COLONOSCOPY N/A 03/14/2015   Procedure: COLONOSCOPY;  Surgeon: Margo LITTIE Haddock, MD;  [facility];  Service: Endoscopy;  Laterality: N/A;  8:30 AM   CORONARY STENT INTERVENTION N/A  05/30/2023   Procedure: CORONARY STENT INTERVENTION;  Surgeon: Ladona Heinz, MD;  Location: [facility];  Service: Cardiovascular;  Laterality: N/A;   CORONARY/GRAFT ACUTE MI REVASCULARIZATION N/A 05/27/2023   Procedure: Coronary/Graft Acute MI Revascularization;  Surgeon: Verlin Lonni BIRCH, MD;  Location: [facility];  Service: Cardiovascular;  Laterality: N/A;   HERNIA REPAIR  08/24/2011   KNEE ARTHROSCOPY Right 08/23/2000   LEFT HEART CATH AND CORONARY ANGIOGRAPHY N/A 05/27/2023   Procedure: LEFT HEART CATH AND CORONARY ANGIOGRAPHY;  Surgeon: Verlin Lonni BIRCH, MD;  Location: [facility];  Service: Cardiovascular;  Laterality: N/A;       Home Medications    Prior to Admission medications   Medication Sig Start Date End Date Taking? Authorizing Provider  albuterol (VENTOLIN HFA) 108 (90 Base) MCG/ACT inhaler Inhale 2 puffs into the lungs every 4 (four) hours as needed. 07/03/24  Yes Stuart Vernell Norris, PA-C  predniSONE  (DELTASONE ) 20 MG tablet Take 2 tablets (40 mg total) by mouth daily with breakfast. 07/03/24  Yes Stuart Vernell Norris, PA-C  promethazine -dextromethorphan (PROMETHAZINE -DM) 6.25-15 MG/5ML syrup Take 5 mLs by mouth 4 (four) times daily as needed. 07/03/24  Yes Stuart Vernell Norris, PA-C  albuterol (VENTOLIN HFA) 108 (90 Base) MCG/ACT inhaler Inhale 2 puffs into the lungs as needed for wheezing or shortness of breath.    [provider]  Cholecalciferol (D3 PO) Take 1 tablet by mouth daily.    [provider]  Evolocumab  (REPATHA  SURECLICK) 140 MG/ML  SOAJ Inject 140 mg into the skin every 14 (fourteen) days. 09/07/23   Verlin Lonni BIRCH, MD  icosapent  Ethyl (VASCEPA ) 1 g capsule Take 2 capsules (2 g total) by mouth 2 (two) times daily. 01/13/24   Trudy Birmingham, PA-C  losartan  (COZAAR ) 25 MG tablet Take 0.5 tablets (12.5 mg total) by mouth daily. 05/30/24   Jesus Bernardino MATSU, MD  nitroGLYCERIN  (NITROSTAT ) 0.4 MG SL  tablet Place 1 tablet (0.4 mg total) under the tongue every 5 (five) minutes as needed for chest pain. 06/13/23   West, Katlyn D, NP  Omega-3 Fatty Acids (OMEGA-3 FISH OIL) 500 MG CAPS Take 500 mg by mouth daily. Take 2 daily 1000 mg/day    [provider]  prasugrel  (EFFIENT ) 10 MG TABS tablet Take 1 tablet (10 mg total) by mouth daily. 03/30/24   Verlin Lonni BIRCH, MD  semaglutide-weight management (WEGOVY) 0.25 MG/0.5ML SOAJ SQ injection Inject 0.25 mg into the skin once a week. 05/30/24   Jesus Bernardino MATSU, MD    Family History Family History  Problem Relation Age of Onset   Heart attack Mother    Cancer Father    CAD Brother    Heart attack Brother    Cancer Brother    Parkinsonism Neg Hx     Social History Social History   Tobacco Use   Smoking status: Never   Smokeless tobacco: Never  Vaping Use   Vaping status: Never Used  Substance Use Topics   Alcohol use: No    Alcohol/week: 0.0 standard drinks of alcohol    Comment: Quit in 1981   Drug use: No     Allergies   Crestor  [rosuvastatin ] and Tequin [gatifloxacin]   Review of Systems Review of Systems PER HPI  Physical Exam Triage Vital Signs ED Triage Vitals  Encounter Vitals Group     BP 07/03/24 0842 (!) 143/92     Girls Systolic BP Percentile --      Girls Diastolic BP Percentile --      Boys Systolic BP Percentile --      Boys Diastolic BP Percentile --      Pulse Rate 07/03/24 0842 74     Resp 07/03/24 0842 16     Temp 07/03/24 0842 98 F (36.7 C)     Temp Source 07/03/24 0842 Oral     SpO2 07/03/24 0842 94 %     Weight --      Height --      Head Circumference --      Peak Flow --      Pain Score 07/03/24 0845 0     Pain Loc --      Pain Education --      Exclude from Growth Chart --    No data found.  Updated Vital Signs BP (!) 143/92 (BP Location: Right Arm)   Pulse 74   Temp 98 F (36.7 C) (Oral)   Resp 16   SpO2 98% Comment: post nebulizer.  Visual Acuity Right Eye  Distance:   Left Eye Distance:   Bilateral Distance:    Right Eye Near:   Left Eye Near:    Bilateral Near:     Physical Exam Vitals and nursing note reviewed.  Constitutional:      Appearance: He is well-developed.  HENT:     Head: Atraumatic.     Right Ear: External ear normal.     Left Ear: External ear normal.     Nose: Rhinorrhea present.  Mouth/Throat:     Pharynx: Posterior oropharyngeal erythema present. No oropharyngeal exudate.  Eyes:     Conjunctiva/sclera: Conjunctivae normal.     Pupils: Pupils are equal, round, and reactive to light.  Cardiovascular:     Rate and Rhythm: Normal rate and regular rhythm.  Pulmonary:     Effort: Pulmonary effort is normal. No respiratory distress.     Breath sounds: Wheezing present. No rales.     Comments: Trace wheezes bilateral bases Musculoskeletal:        General: Normal range of motion.     Cervical back: Normal range of motion and neck supple.  Lymphadenopathy:     Cervical: No cervical adenopathy.  Skin:    General: Skin is warm and dry.  Neurological:     Mental Status: He is alert and oriented to person, place, and time.  Psychiatric:        Behavior: Behavior normal.      UC Treatments / Results  Labs (all labs ordered are listed, but only abnormal results are displayed) Labs Reviewed - No data to display  EKG   Radiology No results found.  Procedures Procedures (including critical care time)  Medications Ordered in UC Medications  ipratropium-albuterol (DUONEB) 0.5-2.5 (3) MG/3ML nebulizer solution 3 mL (3 mLs Nebulization Given 07/03/24 0917)    Initial Impression / Assessment and Plan / UC Course  I have reviewed the triage vital signs and the nursing notes.  Pertinent labs & imaging results that were available during my care of the patient were reviewed by me and considered in my medical decision making (see chart for details).     Vital signs and exam very reassuring today.  Good  improvement after DuoNeb treatment in clinic.  Will treat with a short course of prednisone , albuterol, Phenergan  DM for viral upper respiratory infection causing an asthma exacerbation.  Return for worsening or unresolving symptoms. Final Clinical Impressions(s) / UC Diagnoses   Final diagnoses:  Viral URI with cough  Mild intermittent asthma with acute exacerbation   Discharge Instructions   None    ED Prescriptions     Medication Sig Dispense Auth. Provider   predniSONE  (DELTASONE ) 20 MG tablet Take 2 tablets (40 mg total) by mouth daily with breakfast. 6 tablet Stuart Vernell Norris, PA-C   albuterol (VENTOLIN HFA) 108 (90 Base) MCG/ACT inhaler Inhale 2 puffs into the lungs every 4 (four) hours as needed. 6.7 g Stuart Vernell Norris, PA-C   promethazine -dextromethorphan (PROMETHAZINE -DM) 6.25-15 MG/5ML syrup Take 5 mLs by mouth 4 (four) times daily as needed. 100 mL Stuart Vernell Norris, [place]      PDMP not reviewed this encounter.   Stuart Vernell Norris, [place] 07/03/24 1942

## 2024-07-04 ENCOUNTER — Other Ambulatory Visit (HOSPITAL_BASED_OUTPATIENT_CLINIC_OR_DEPARTMENT_OTHER): Payer: Self-pay

## 2024-07-05 ENCOUNTER — Other Ambulatory Visit (HOSPITAL_BASED_OUTPATIENT_CLINIC_OR_DEPARTMENT_OTHER): Payer: Self-pay

## 2024-07-05 ENCOUNTER — Other Ambulatory Visit: Payer: Self-pay

## 2024-07-05 ENCOUNTER — Ambulatory Visit (INDEPENDENT_AMBULATORY_CARE_PROVIDER_SITE_OTHER): Payer: PRIVATE HEALTH INSURANCE

## 2024-07-05 ENCOUNTER — Ambulatory Visit (INDEPENDENT_AMBULATORY_CARE_PROVIDER_SITE_OTHER): Payer: PRIVATE HEALTH INSURANCE | Admitting: Family Medicine

## 2024-07-05 ENCOUNTER — Encounter: Payer: Self-pay | Admitting: Internal Medicine

## 2024-07-05 ENCOUNTER — Other Ambulatory Visit (HOSPITAL_COMMUNITY): Payer: Self-pay

## 2024-07-05 ENCOUNTER — Ambulatory Visit: Payer: Self-pay | Admitting: Family Medicine

## 2024-07-05 ENCOUNTER — Encounter: Payer: Self-pay | Admitting: Family Medicine

## 2024-07-05 VITALS — BP 120/76 | HR 92 | Temp 98.0°F | Ht 66.0 in | Wt 168.0 lb

## 2024-07-05 DIAGNOSIS — J40 Bronchitis, not specified as acute or chronic: Secondary | ICD-10-CM

## 2024-07-05 DIAGNOSIS — R051 Acute cough: Secondary | ICD-10-CM

## 2024-07-05 MED ORDER — CEFDINIR 300 MG PO CAPS
300.0000 mg | ORAL_CAPSULE | Freq: Two times a day (BID) | ORAL | 0 refills | Status: DC
  Filled 2024-07-05: qty 14, 7d supply, fill #0

## 2024-07-05 MED ORDER — TESTOSTERONE 25 MG/2.5GM (1%) TD GEL
2.0000 | Freq: Every day | TRANSDERMAL | 5 refills | Status: AC
  Filled 2024-07-05 (×3): qty 150, 30d supply, fill #0
  Filled 2024-08-06: qty 150, 30d supply, fill #1
  Filled 2024-09-10: qty 150, 30d supply, fill #2

## 2024-07-05 NOTE — Progress Notes (Signed)
 Subjective:     Patient ID: Juan Schultz, male    DOB: [birth year], 68 y.o.   MRN: [id number]  Chief Complaint  Patient presents with   Follow-up    Urgent care follow-up for URI. Feeling ok but not great. Suppose to return to work tomorrow. Tightness in chest and congestion. Has been using the inhaler every four hrs. Also using the cough syrup from Urgent Care. Wondering about getting a testosterone shot because [facility] having trouble getting [facility] .     Discussed the use of AI scribe software for clinical note transcription with the patient, who gave verbal consent to proceed.  History of Present Illness Juan Schultz is a 68 year old male who presents with symptoms of an upper respiratory infection.  Symptoms began on Sunday, prompting him to seek care at an urgent care on Tuesday, where he was diagnosed with an upper respiratory infection. He was prescribed promethazine  for cough and prednisone , which he completed this morning. He has been using an albuterol inhaler every four hours for shortness of breath.  He experiences chest pain on the left side, which he associates with his history of bronchitis and pneumonia. He is concerned about the potential progression to pneumonia, noting a tight feeling in his chest and loss of voice. He has started to cough up sputum. No current fever, but he mentions experiencing chills and head pressure.  He has been experiencing fatigue, which he attributes to low testosterone levels. He has been prescribed testosterone gel, but [facility] been [facility] due to [facility] issues. He was instructed to increase his dose to two packets per day but has been without the medication since last week.  He reports a previous adverse reaction to losartan , which caused stomach pain after ingestion, and he has not taken it since. Will retry    Health Maintenance Due  Topic Date Due   Hepatitis C Screening  Never done   Zoster Vaccines-  Shingrix (1 of 2) Never done    Past Medical History:  Diagnosis Date   Allergy 1987   CAD (coronary artery disease)    HTN (hypertension)    Hyperlipidemia    Myocardial infarction (HCC) 10/24   Heart attack/2 stents    Past Surgical History:  Procedure Laterality Date   COLONOSCOPY N/A 03/14/2015   Procedure: COLONOSCOPY;  Surgeon: Margo LITTIE Haddock, Juan;  [facility];  Service: Endoscopy;  Laterality: N/A;  8:30 AM   CORONARY STENT INTERVENTION N/A 05/30/2023   Procedure: CORONARY STENT INTERVENTION;  Surgeon: Ladona Heinz, Juan;  [facility];  Service: Cardiovascular;  Laterality: N/A;   CORONARY/GRAFT ACUTE MI REVASCULARIZATION N/A 05/27/2023   Procedure: Coronary/Graft Acute MI Revascularization;  Surgeon: Verlin Lonni BIRCH, Juan;  [facility];  Service: Cardiovascular;  Laterality: N/A;   HERNIA REPAIR  08/24/2011   KNEE ARTHROSCOPY Right 08/23/2000   LEFT HEART CATH AND CORONARY ANGIOGRAPHY N/A 05/27/2023   Procedure: LEFT HEART CATH AND CORONARY ANGIOGRAPHY;  Surgeon: Verlin Lonni BIRCH, Juan;  [facility];  Service: Cardiovascular;  Laterality: N/A;     Current Outpatient Medications:    albuterol (VENTOLIN HFA) 108 (90 Base) MCG/ACT inhaler, Inhale 2 puffs into the lungs every 4 (four) hours as needed., Disp: 6.7 g, Rfl: 0   cefdinir (OMNICEF) 300 MG capsule, Take 1 capsule (300 mg total) by mouth 2 (two) times daily., Disp: 14 capsule,  Rfl: 0   Cholecalciferol (D3 PO), Take 1 tablet by mouth daily., Disp: , Rfl:    Evolocumab  (REPATHA  SURECLICK) 140 MG/ML SOAJ, Inject 140 mg into the skin every 14 (fourteen) days., Disp: 6 mL, Rfl: 3   icosapent  Ethyl (VASCEPA ) 1 g capsule, Take 2 capsules (2 g total) by mouth 2 (two) times daily., Disp: 360 capsule, Rfl: 3   nitroGLYCERIN  (NITROSTAT ) 0.4 MG SL tablet, Place 1 tablet (0.4 mg total) under the tongue every 5 (five) minutes as needed for chest pain., Disp: 25  tablet, Rfl: 3   Omega-3 Fatty Acids (OMEGA-3 FISH OIL) 500 MG CAPS, Take 500 mg by mouth daily. Take 2 daily 1000 mg/day, Disp: , Rfl:    prasugrel  (EFFIENT ) 10 MG TABS tablet, Take 1 tablet (10 mg total) by mouth daily., Disp: 90 tablet, Rfl: 3   promethazine -dextromethorphan (PROMETHAZINE -DM) 6.25-15 MG/5ML syrup, Take 5 mLs by mouth 4 (four) times daily as needed., Disp: 100 mL, Rfl: 0   Testosterone 25 MG/2.5GM (1%) GEL, 2 packets Transdermal Once a day; Duration: 30 days, Disp: , Rfl:    losartan  (COZAAR ) 25 MG tablet, Take 0.5 tablets (12.5 mg total) by mouth daily. (Patient not taking: Reported on 07/05/2024), Disp: 45 tablet, Rfl: 3   semaglutide-weight management (WEGOVY) 0.25 MG/0.5ML SOAJ SQ injection, Inject 0.25 mg into the skin once a week., Disp: 2 mL, Rfl: 2   Testosterone 25 MG/2.5GM (1%) GEL, Place 2 packets onto the skin Transdermal daily., Disp: 150 g, Rfl: 5  Allergies  Allergen Reactions   Rosuvastatin  Other (See Comments)    Muscle cramps   Gatifloxacin Dermatitis and Other (See Comments)    Mouth got numb   ROS neg/noncontributory except as noted HPI/below      Objective:     BP 120/76   Pulse 92   Temp 98 F (36.7 C) (Temporal)   Ht 5' 6 (1.676 m)   Wt 168 lb (76.2 kg)   SpO2 98%   BMI 27.12 kg/m  Wt Readings from Last 3 Encounters:  07/05/24 168 lb (76.2 kg)  05/30/24 161 lb 12.8 oz (73.4 kg)  03/30/24 163 lb (73.9 kg)    Physical Exam GENERAL: Well developed, well nourished, no acute distress. Hoarse voice HEAD EYES EARS NOSE THROAT: Normocephalic, atraumatic, conjunctiva not injected, sclera nonicteric. Tympanic membranes normal bilaterally. Oropharynx clear, moist without exudates. Left maxillary sinus tender to percussion. CARDIAC: Regular rate and rhythm, S1 S2 present, no murmur,  NECK: Supple, mild submandibular lymphadenopathy, no thyromegaly, . LUNGS: Clear to auscultation bilaterally, no wheezes. EXTREMITIES: No  edema. MUSCULOSKELETAL: No gross abnormalities. NEUROLOGICAL: Alert and oriented x3, cranial nerves II through XII intact. PSYCHIATRIC: Normal mood, good eye contact.   Reviewed UC records    Assessment & Plan:  Bronchitis  Acute cough -     DG Chest 2 View; Future  Other orders -     Cefdinir; Take 1 capsule (300 mg total) by mouth 2 (two) times daily.  Dispense: 14 capsule; Refill: 0    Assessment and Plan Assessment & Plan Acute upper respiratory infection with cough and possible pneumonia   Symptoms began on Sunday with persistent cough, chest pain, and shortness of breath. There is concern for possible pneumonia due to his history of recurrent bronchitis and pneumonia. He has no fever but experiences chills and head pressure. Lungs are clear on auscultation, yet pneumonia cannot be ruled out without imaging. Differential diagnosis includes bronchitis and pneumonia. A chest x-ray has been ordered  to evaluate for pneumonia. Omnicef is prescribed for antibiotic coverage. He should continue promethazine  for cough management.  Testosterone deficiency   He has low testosterone levels and experiences fatigue. The prescription for testosterone gel was not filled due to pharmacy issues. His regimen was increased to two packets daily, but he has been without medication for over a week. He is advised to check with [facility] pharmacies for testosterone gel availability and to inform Doctor Jesus if his blood pressure spikes due to losartan  intolerance.     Return if symptoms worsen or fail to improve.  Jenkins CHRISTELLA Carrel, Juan

## 2024-07-05 NOTE — Progress Notes (Signed)
 Left detailed voice mail per patients DPR .

## 2024-07-05 NOTE — Patient Instructions (Signed)
 Get antibiotics

## 2024-07-06 ENCOUNTER — Other Ambulatory Visit (HOSPITAL_COMMUNITY): Payer: Self-pay

## 2024-07-12 ENCOUNTER — Other Ambulatory Visit (HOSPITAL_COMMUNITY): Payer: Self-pay

## 2024-07-12 ENCOUNTER — Ambulatory Visit: Payer: Self-pay | Admitting: *Deleted

## 2024-07-12 MED ORDER — TICAGRELOR 90 MG PO TABS
90.0000 mg | ORAL_TABLET | Freq: Two times a day (BID) | ORAL | 1 refills | Status: DC
  Filled 2024-07-12: qty 180, 90d supply, fill #0

## 2024-07-12 MED ORDER — CLOPIDOGREL BISULFATE 75 MG PO TABS
75.0000 mg | ORAL_TABLET | Freq: Every day | ORAL | 3 refills | Status: AC
  Filled 2024-07-12: qty 30, 30d supply, fill #0

## 2024-07-12 NOTE — Telephone Encounter (Signed)
 FYI Only or Action Required?: Action required by provider: update on patient condition.  Patient was last seen in primary care on 07/05/2024 by Wendolyn Jenkins Jansky, MD.  Called Nurse Triage reporting Cough (Weak, cough).  Symptoms began several days ago.  Interventions attempted: Prescription medications: Cefdinir.  Symptoms are: gradually worsening.  Triage Disposition: See PCP When Office is Open (Within 3 Days)  Patient/caregiver understands and will follow disposition?: No, wishes to speak with PCP  Copied from CRM #[id number]. Topic: Clinical - Red Word Triage >> Jul 12, 2024 12:03 PM Franky GRADE wrote: Red Word that prompted transfer to Nurse Triage: Patient is calling because he was seen on 07/05/2024 for Bronchitis, patient finish the cycle of antibiotics yesterday and symptoms have not improved. Reason for Disposition  [1] Finished taking antibiotics AND [2] symptoms are BETTER but [3] not completely gone  Answer Assessment - Initial Assessment Questions Patient was seen 11/13 and treated for Bronchitis. Patient states his symptoms seemed to be improving Tuesday- but he now has finished his antibiotic and his cough is becoming more productive and he has yellow sputum. Patient states he is working in [employer] today- he would like another treatment- he uses [facility] outpatient pharmacy   1. INFECTION: What infection is the antibiotic being given for?     bronchitis 2. ANTIBIOTIC: What antibiotic are you taking How many times per day?     New Cefdinir 300 mg Oral 2 times daily 3. DURATION: When was the antibiotic started?     7 days 4. MAIN CONCERN OR SYMPTOM:  What is your main concern right now?     Cough, fatigue/weakness 5. BETTER-SAME-WORSE: Are you getting better, staying the same, or getting worse compared to when you first started the antibiotics? If getting worse, ask: In what way?      Getting better- around Tuesday- then symptoms seemed to get worse 6. FEVER: Do  you have a fever? If Yes, ask: What is your temperature, how was it measured, and when did it start?     No- sweating last night 7. SYMPTOMS: Are there any other symptoms you're concerned about? If Yes, ask: When did it start?     Cough- patient has had cough- but seems to be productive again 8. FOLLOW-UP APPOINTMENT: Do you have a follow-up appointment with your doctor?     no  Protocols used: Infection on Antibiotic Follow-up Call-A-[facility]

## 2024-07-12 NOTE — Telephone Encounter (Signed)
 Pt has appt tomorrow with another provider in this office

## 2024-07-13 ENCOUNTER — Other Ambulatory Visit (HOSPITAL_COMMUNITY): Payer: Self-pay

## 2024-07-13 ENCOUNTER — Ambulatory Visit (INDEPENDENT_AMBULATORY_CARE_PROVIDER_SITE_OTHER): Payer: PRIVATE HEALTH INSURANCE | Admitting: Physician Assistant

## 2024-07-13 ENCOUNTER — Other Ambulatory Visit: Payer: Self-pay

## 2024-07-13 ENCOUNTER — Encounter: Payer: Self-pay | Admitting: Physician Assistant

## 2024-07-13 ENCOUNTER — Other Ambulatory Visit (HOSPITAL_BASED_OUTPATIENT_CLINIC_OR_DEPARTMENT_OTHER): Payer: Self-pay

## 2024-07-13 ENCOUNTER — Telehealth (HOSPITAL_COMMUNITY): Payer: Self-pay

## 2024-07-13 VITALS — BP 136/80 | HR 82 | Temp 98.4°F | Ht 66.0 in | Wt 166.5 lb

## 2024-07-13 DIAGNOSIS — R052 Subacute cough: Secondary | ICD-10-CM

## 2024-07-13 DIAGNOSIS — R5383 Other fatigue: Secondary | ICD-10-CM | POA: Diagnosis not present

## 2024-07-13 DIAGNOSIS — J4541 Moderate persistent asthma with (acute) exacerbation: Secondary | ICD-10-CM

## 2024-07-13 DIAGNOSIS — I2510 Atherosclerotic heart disease of native coronary artery without angina pectoris: Secondary | ICD-10-CM | POA: Diagnosis not present

## 2024-07-13 DIAGNOSIS — I252 Old myocardial infarction: Secondary | ICD-10-CM

## 2024-07-13 DIAGNOSIS — R0602 Shortness of breath: Secondary | ICD-10-CM | POA: Diagnosis not present

## 2024-07-13 MED ORDER — DOXYCYCLINE HYCLATE 100 MG PO TABS
100.0000 mg | ORAL_TABLET | Freq: Two times a day (BID) | ORAL | 0 refills | Status: AC
  Filled 2024-07-13: qty 20, 10d supply, fill #0

## 2024-07-13 MED ORDER — BUDESONIDE-FORMOTEROL FUMARATE 80-4.5 MCG/ACT IN AERO
2.0000 | INHALATION_SPRAY | Freq: Two times a day (BID) | RESPIRATORY_TRACT | 3 refills | Status: AC
  Filled 2024-07-13 (×2): qty 10.2, 30d supply, fill #0
  Filled 2024-08-16: qty 10.2, 30d supply, fill #1
  Filled 2024-09-03 – 2024-09-10 (×2): qty 10.2, 30d supply, fill #2

## 2024-07-13 NOTE — Patient Instructions (Addendum)
 SABRA

## 2024-07-13 NOTE — Progress Notes (Signed)
 Juan Schultz is a 68 y.o. male here for a follow up of a pre-existing problem.  History of Present Illness:   Chief Complaint  Patient presents with   Cough    Pt c/o cough x 3 weeks, expectorating yellow sputum, went to UC on 11/11 and saw Dr. Wendolyn on 11/13 was given antibiotic and completed.     Discussed the use of AI scribe software for clinical note transcription with the patient, who gave verbal consent to proceed.  History of Present Illness   Juan Schultz is a 68 year old male with asthma and heart disease who presents with respiratory symptoms and fatigue.  He has experienced respiratory symptoms since July 03, 2024, following an asthma exacerbation and possible viral infection. He completed a course of albuterol  and steroids . He uses albuterol  every four hours for breathing difficulties. He has a sore throat and drainage, possibly related to illness or testosterone  therapy, which he recently resumed at an increased dose.  He has a history of heart disease with no evidence of heart damage on EKGs and is compliant with Repatha  and Vascepa . He does not tolerate statins well. He has obstructive sleep apnea and struggles with CPAP use, contributing to fatigue.  He works with developmentally disabled individuals, potentially exposing him to various illnesses. He experiences dizziness with deep breathing and finds Mucinex helpful for his cough. No recent exposure to illness, ear pain, or nasal congestion.        Past Medical History:  Diagnosis Date   Allergy 1987   CAD (coronary artery disease)    HTN (hypertension)    Hyperlipidemia    Myocardial infarction (HCC) 10/24   Heart attack/2 stents     Social History   Tobacco Use   Smoking status: Never   Smokeless tobacco: Never  Vaping Use   Vaping status: Never Used  Substance Use Topics   Alcohol use: No    Alcohol/week: 0.0 standard drinks of alcohol    Comment: Quit in 1981   Drug use: No     Past Surgical History:  Procedure Laterality Date   COLONOSCOPY N/A 03/14/2015   Procedure: COLONOSCOPY;  Surgeon: Margo LITTIE Haddock, MD;  Location: [facility];  Service: Endoscopy;  Laterality: N/A;  8:30 AM   CORONARY STENT INTERVENTION N/A 05/30/2023   Procedure: CORONARY STENT INTERVENTION;  Surgeon: Ladona Heinz, MD;  Location: [facility];  Service: Cardiovascular;  Laterality: N/A;   CORONARY/GRAFT ACUTE MI REVASCULARIZATION N/A 05/27/2023   Procedure: Coronary/Graft Acute MI Revascularization;  Surgeon: Verlin Lonni BIRCH, MD;  Location: [facility];  Service: Cardiovascular;  Laterality: N/A;   HERNIA REPAIR  08/24/2011   KNEE ARTHROSCOPY Right 08/23/2000   LEFT HEART CATH AND CORONARY ANGIOGRAPHY N/A 05/27/2023   Procedure: LEFT HEART CATH AND CORONARY ANGIOGRAPHY;  Surgeon: Verlin Lonni BIRCH, MD;  [facility]: [facility];  Service: Cardiovascular;  Laterality: N/A;    Family History  Problem Relation Age of Onset   Heart attack Mother    Cancer Mother    Heart disease Mother    Cancer Father    Heart disease Father    CAD Brother    Heart attack Brother    Cancer Brother    Heart disease Brother    Parkinsonism Neg Hx     Allergies  Allergen Reactions   Rosuvastatin  Other (See Comments)    Muscle cramps   Gatifloxacin Dermatitis and Other (See Comments)  Mouth got numb    Current Medications:   Current Outpatient Medications:    albuterol  (VENTOLIN  HFA) 108 (90 Base) MCG/ACT inhaler, Inhale 2 puffs into the lungs every 4 (four) hours as needed., Disp: 6.7 g, Rfl: 0   Cholecalciferol (D3 PO), Take 1 tablet by mouth daily., Disp: , Rfl:    clopidogrel  (PLAVIX ) 75 MG tablet, Take 1 tablet (75 mg total) by mouth daily., Disp: 90 tablet, Rfl: 3   Evolocumab  (REPATHA  SURECLICK) 140 MG/ML SOAJ, Inject 140 mg into the skin every 14 (fourteen) days., Disp: 6 mL, Rfl: 3   icosapent  Ethyl (VASCEPA ) 1 g capsule, Take 2 capsules (2 g total) by  mouth 2 (two) times daily., Disp: 360 capsule, Rfl: 3   losartan  (COZAAR ) 25 MG tablet, Take 0.5 tablets (12.5 mg total) by mouth daily., Disp: 45 tablet, Rfl: 3   nitroGLYCERIN  (NITROSTAT ) 0.4 MG SL tablet, Place 1 tablet (0.4 mg total) under the tongue every 5 (five) minutes as needed for chest pain., Disp: 25 tablet, Rfl: 3   Omega-3 Fatty Acids (OMEGA-3 FISH OIL) 500 MG CAPS, Take 500 mg by mouth daily. Take 2 daily 1000 mg/day, Disp: , Rfl:    prasugrel  (EFFIENT ) 10 MG TABS tablet, Take 1 tablet (10 mg total) by mouth daily., Disp: 90 tablet, Rfl: 3   Testosterone  25 MG/2.5GM (1%) GEL, Place 2 packets onto the skin Transdermal daily., Disp: 150 g, Rfl: 5   semaglutide -weight management (WEGOVY ) 0.25 MG/0.5ML SOAJ SQ injection, Inject 0.25 mg into the skin once a week. (Patient not taking: Reported on 07/13/2024), Disp: 2 mL, Rfl: 2   Review of Systems:   Negative unless otherwise specified per HPI.  Vitals:   Vitals:   07/13/24 0805  BP: 136/80  Pulse: 82  Temp: 98.4 F (36.9 C)  TempSrc: Temporal  SpO2: 97%  Weight: 166 lb 8 oz (75.5 kg)  Height: 5' 6 (1.676 m)     Body mass index is 26.87 kg/m.  Physical Exam:   Physical Exam Vitals and nursing note reviewed.  Constitutional:      General: He is not in acute distress.    Appearance: He is well-developed. He is not ill-appearing or toxic-appearing.  Cardiovascular:     Rate and Rhythm: Normal rate and regular rhythm.     Pulses: Normal pulses.     Heart sounds: Normal heart sounds, S1 normal and S2 normal.  Pulmonary:     Effort: Pulmonary effort is normal.     Breath sounds: Normal breath sounds.  Skin:    General: Skin is warm and dry.  Neurological:     Mental Status: He is alert.     GCS: GCS eye subscore is 4. GCS verbal subscore is 5. GCS motor subscore is 6.  Psychiatric:        Speech: Speech normal.        Behavior: Behavior normal. Behavior is cooperative.     Assessment and Plan:   Assessment  and Plan    Moderate persistent asthma with acute exacerbation; shortness of breath; cough  Recent exacerbation likely viral. Frequent albuterol  use. Risk of bronchitis or pneumonia. Differential includes reactive airway disease. - EKG tracing is personally reviewed.  EKG notes NSR.  No acute changes.  - will add steroid inhaler to help reduce frequent use of albuterol  -- discussed need for twice daily use to help with reduced inflammation - add doxycycline  antibiotic(s) - Consider pulmonary referral if persists/worsens  Coronary artery disease No recent  events.Managed with Repatha  and Vascepa . LDL elevated at 196 mg/dL. Statins not tolerated. - Continue Repatha  and Vascepa . - Needs close follow up with cardiology for better lipid management  - EKG tracing is personally reviewed.  EKG notes NSR.  No acute changes.   Fatigue Possibly related to recent illness and testosterone  therapy lapse. Therapy resumed. Unable to tolerate CPAP and has obstructive sleep apnea. - recommend close follow up with Primary Care Provider (PCP) and possibly cardiology for further evaluation   Lucie Buttner, PA-C

## 2024-07-14 ENCOUNTER — Other Ambulatory Visit (HOSPITAL_BASED_OUTPATIENT_CLINIC_OR_DEPARTMENT_OTHER): Payer: Self-pay

## 2024-07-16 ENCOUNTER — Other Ambulatory Visit (HOSPITAL_COMMUNITY): Payer: Self-pay

## 2024-07-16 ENCOUNTER — Other Ambulatory Visit: Payer: Self-pay

## 2024-07-17 ENCOUNTER — Other Ambulatory Visit (HOSPITAL_COMMUNITY): Payer: Self-pay

## 2024-07-17 ENCOUNTER — Other Ambulatory Visit: Payer: Self-pay

## 2024-07-30 ENCOUNTER — Other Ambulatory Visit (HOSPITAL_BASED_OUTPATIENT_CLINIC_OR_DEPARTMENT_OTHER): Payer: Self-pay

## 2024-08-06 ENCOUNTER — Other Ambulatory Visit (HOSPITAL_COMMUNITY): Payer: Self-pay

## 2024-08-07 ENCOUNTER — Other Ambulatory Visit: Payer: Self-pay

## 2024-08-07 ENCOUNTER — Other Ambulatory Visit (HOSPITAL_COMMUNITY): Payer: Self-pay

## 2024-08-14 ENCOUNTER — Telehealth: Payer: Self-pay

## 2024-08-14 ENCOUNTER — Ambulatory Visit: Payer: Self-pay

## 2024-08-14 ENCOUNTER — Ambulatory Visit
Admission: EM | Admit: 2024-08-14 | Discharge: 2024-08-14 | Disposition: A | Discharge status: Home / Self Care | Payer: PRIVATE HEALTH INSURANCE | Attending: Family Medicine | Admitting: Family Medicine

## 2024-08-14 ENCOUNTER — Other Ambulatory Visit (HOSPITAL_BASED_OUTPATIENT_CLINIC_OR_DEPARTMENT_OTHER): Payer: Self-pay

## 2024-08-14 ENCOUNTER — Encounter: Payer: Self-pay | Admitting: Internal Medicine

## 2024-08-14 DIAGNOSIS — J069 Acute upper respiratory infection, unspecified: Secondary | ICD-10-CM

## 2024-08-14 DIAGNOSIS — J4541 Moderate persistent asthma with (acute) exacerbation: Secondary | ICD-10-CM

## 2024-08-14 DIAGNOSIS — R051 Acute cough: Secondary | ICD-10-CM | POA: Diagnosis not present

## 2024-08-14 LAB — POC COVID19/FLU A&B COMBO
Covid Antigen, POC: NEGATIVE
Influenza A Antigen, POC: NEGATIVE
Influenza B Antigen, POC: NEGATIVE

## 2024-08-14 MED ORDER — PREDNISONE 20 MG PO TABS
40.0000 mg | ORAL_TABLET | Freq: Every day | ORAL | 0 refills | Status: AC
  Filled 2024-08-14: qty 10, 5d supply, fill #0

## 2024-08-14 NOTE — ED Triage Notes (Signed)
 Pt reports he has a cough, nasal drainage, sinus pressure x 2 days   Took mucinex

## 2024-08-14 NOTE — ED Provider Notes (Signed)
 " [facility]    CSN: [id number] Arrival date & time: 08/14/24  1548      History   Chief Complaint No chief complaint on file.   HPI Juan Schultz is a 68 y.o. male.   Patient presenting today with 2-day history of cough, rhinorrhea, sinus pressure, chest tightness, body aches.  Denies chest pain, shortness of breath, vomiting, diarrhea, known fevers, rashes.  So far try Mucinex and using his Symbicort  and albuterol  regimen for asthma with mild temporary benefit.    Past Medical History:  Diagnosis Date   Allergy 1987   CAD (coronary artery disease)    HTN (hypertension)    Hyperlipidemia    Myocardial infarction (HCC) 10/24   Heart attack/2 stents    Patient Active Problem List   Diagnosis Date Noted   Intolerance of continuous positive airway pressure (CPAP) ventilation 05/30/2024   Other fatigue 05/30/2024   ARB intolerance 05/30/2024   Statin intolerance 05/30/2024   OSA (obstructive sleep apnea) 01/27/2024   Mass of neck 01/27/2024   Allergic rhinitis 01/27/2024   Male hypogonadism 01/27/2024   Primary hypertension 05/30/2023   Hypercholesteremia 05/30/2023   Status post coronary artery stent placement 05/28/2023   Coronary arteriosclerosis in patient with history of previous myocardial infarction 05/27/2023   Symptomatic cholelithiasis 10/31/2022   Arthralgia of right knee 03/08/2022   Osteoarthritis of right knee 03/08/2022   Tremor 01/01/2015    Past Surgical History:  Procedure Laterality Date   COLONOSCOPY N/A 03/14/2015   Procedure: COLONOSCOPY;  Surgeon: Margo LITTIE Haddock, MD;  [facility]: [facility];  Service: Endoscopy;  Laterality: N/A;  8:30 AM   CORONARY STENT INTERVENTION N/A 05/30/2023   Procedure: CORONARY STENT INTERVENTION;  Surgeon: Ladona Heinz, MD;  [facility];  Service: Cardiovascular;  Laterality: N/A;   CORONARY/GRAFT ACUTE MI REVASCULARIZATION N/A 05/27/2023   Procedure: Coronary/Graft Acute MI  Revascularization;  Surgeon: Verlin Lonni BIRCH, MD;  [facility];  Service: Cardiovascular;  Laterality: N/A;   HERNIA REPAIR  08/24/2011   KNEE ARTHROSCOPY Right 08/23/2000   LEFT HEART CATH AND CORONARY ANGIOGRAPHY N/A 05/27/2023   Procedure: LEFT HEART CATH AND CORONARY ANGIOGRAPHY;  Surgeon: Verlin Lonni BIRCH, MD;  [facility];  Service: Cardiovascular;  Laterality: N/A;       Home Medications    Prior to Admission medications  Medication Sig Start Date End Date Taking? Authorizing Provider  predniSONE  (DELTASONE ) 20 MG tablet Take 2 tablets (40 mg total) by mouth daily with breakfast. 08/14/24  Yes Stuart Vernell Norris, PA-C  albuterol  (VENTOLIN  HFA) 108 (90 Base) MCG/ACT inhaler Inhale 2 puffs into the lungs every 4 (four) hours as needed. 07/03/24   Stuart Vernell Norris, PA-C  budesonide -formoterol  (SYMBICORT ) 80-4.5 MCG/ACT inhaler Inhale 2 puffs into the lungs 2 (two) times daily. 07/13/24   Job Lukes, PA  Cholecalciferol (D3 PO) Take 1 tablet by mouth daily.    [provider]  clopidogrel  (PLAVIX ) 75 MG tablet Take 1 tablet (75 mg total) by mouth daily. 12/12/23     doxycycline  (VIBRA -TABS) 100 MG tablet Take 1 tablet (100 mg total) by mouth 2 (two) times daily. 07/13/24   Job Lukes, PA  Evolocumab  (REPATHA  SURECLICK) 140 MG/ML SOAJ Inject 140 mg into the skin every 14 (fourteen) days. 09/07/23   Verlin Lonni BIRCH, MD  icosapent  Ethyl (VASCEPA ) 1 g capsule Take 2 capsules (2 g total) by mouth 2 (two) times daily. 01/13/24  Trudy Birmingham, PA-C  losartan  (COZAAR ) 25 MG tablet Take 0.5 tablets (12.5 mg total) by mouth daily. 05/30/24   Jesus Bernardino MATSU, MD  nitroGLYCERIN  (NITROSTAT ) 0.4 MG SL tablet Place 1 tablet (0.4 mg total) under the tongue every 5 (five) minutes as needed for chest pain. 06/13/23   West, Katlyn D, NP  Omega-3 Fatty Acids (OMEGA-3 FISH OIL) 500 MG CAPS Take 500 mg by mouth daily. Take 2  daily 1000 mg/day    [provider]  prasugrel  (EFFIENT ) 10 MG TABS tablet Take 1 tablet (10 mg total) by mouth daily. 03/30/24   Verlin Lonni BIRCH, MD  semaglutide -weight management (WEGOVY ) 0.25 MG/0.5ML SOAJ SQ injection Inject 0.25 mg into the skin once a week. Patient not taking: Reported on 07/13/2024 05/30/24   Jesus Bernardino MATSU, MD  Testosterone  25 MG/2.5GM (1%) GEL Place 2 packets onto the skin Transdermal daily. 07/05/24       Family History Family History  Problem Relation Age of Onset   Heart attack Mother    Cancer Mother    Heart disease Mother    Cancer Father    Heart disease Father    CAD Brother    Heart attack Brother    Cancer Brother    Heart disease Brother    Parkinsonism Neg Hx     Social History Social History[1]   Allergies   Rosuvastatin  and Gatifloxacin   Review of Systems Review of Systems Per HPI  Physical Exam Triage Vital Signs ED Triage Vitals  Encounter Vitals Group     BP 08/14/24 1556 (!) 145/75     Girls Systolic BP Percentile --      Girls Diastolic BP Percentile --      Boys Systolic BP Percentile --      Boys Diastolic BP Percentile --      Pulse Rate 08/14/24 1556 (!) 108     Resp 08/14/24 1556 18     Temp 08/14/24 1556 98.1 F (36.7 C)     Temp Source 08/14/24 1556 Oral     SpO2 08/14/24 1556 92 %     Weight --      Height --      Head Circumference --      Peak Flow --      Pain Score 08/14/24 1557 0     Pain Loc --      Pain Education --      Exclude from Growth Chart --    No data found.  Updated Vital Signs BP (!) 145/75 (BP Location: Right Arm)   Pulse (!) 108   Temp 98.1 F (36.7 C) (Oral)   Resp 18   SpO2 92%   Visual Acuity Right Eye Distance:   Left Eye Distance:   Bilateral Distance:    Right Eye Near:   Left Eye Near:    Bilateral Near:     Physical Exam Vitals and nursing note reviewed.  Constitutional:      Appearance: He is well-developed.  HENT:     Head: Atraumatic.      Right Ear: External ear normal.     Left Ear: External ear normal.     Nose: Rhinorrhea present.     Mouth/Throat:     Pharynx: Posterior oropharyngeal erythema present. No oropharyngeal exudate.  Eyes:     Conjunctiva/sclera: Conjunctivae normal.     Pupils: Pupils are equal, round, and reactive to light.  Cardiovascular:     Rate and Rhythm: Normal rate  and regular rhythm.  Pulmonary:     Effort: Pulmonary effort is normal. No respiratory distress.     Breath sounds: No wheezing or rales.  Musculoskeletal:        General: Normal range of motion.     Cervical back: Normal range of motion and neck supple.  Lymphadenopathy:     Cervical: No cervical adenopathy.  Skin:    General: Skin is warm and dry.  Neurological:     Mental Status: He is alert and oriented to person, place, and time.  Psychiatric:        Behavior: Behavior normal.      UC Treatments / Results  Labs (all labs ordered are listed, but only abnormal results are displayed) Labs Reviewed  POC COVID19/FLU A&B COMBO    EKG   Radiology No results found.  Procedures Procedures (including critical care time)  Medications Ordered in UC Medications - No data to display  Initial Impression / Assessment and Plan / UC Course  I have reviewed the triage vital signs and the nursing notes.  Pertinent labs & imaging results that were available during my care of the patient were reviewed by me and considered in my medical decision making (see chart for details).     Mildly tachycardic and hypertensive in triage, otherwise vital signs reassuring.  He is well-appearing and in no acute distress.  Suspect viral respiratory infection causing asthma exacerbation.  Rapid flu and COVID-negative.  Will treat with prednisone , over-the-counter cold and congestion medication, continued inhaler regimen and supportive home care.  Return for worsening or unresolving symptoms.  Final Clinical Impressions(s) / UC Diagnoses    Final diagnoses:  Acute cough  Viral URI with cough  Moderate persistent asthma with acute exacerbation     Discharge Instructions      In addition to the prednisone , continue your inhaler regimen, and may use Astelin and/or Flonase nasal spray, Coricidin HBP, plain Mucinex, use saline sinus rinses and humidifiers.  Follow-up for worsening or unresolving symptoms.    ED Prescriptions     Medication Sig Dispense Auth. Provider   predniSONE  (DELTASONE ) 20 MG tablet Take 2 tablets (40 mg total) by mouth daily with breakfast. 10 tablet Stuart Vernell Norris, [place]      PDMP not reviewed this encounter.    [1]  Social History Tobacco Use   Smoking status: Never   Smokeless tobacco: Never  Vaping Use   Vaping status: Never Used  Substance Use Topics   Alcohol use: No    Alcohol/week: 0.0 standard drinks of alcohol    Comment: Quit in 1981   Drug use: No     Stuart Vernell Norris, [place] 08/14/24 1657  "

## 2024-08-14 NOTE — Telephone Encounter (Signed)
 Copied from CRM #[id number]. Topic: General - Other >> Aug 14, 2024  3:05 PM Macario HERO wrote: Reason for CRM: Patient requesting a call back from provider nurse.

## 2024-08-14 NOTE — Telephone Encounter (Signed)
 Copied from CRM #[id number]. Topic: General - Other >> Aug 14, 2024  3:05 PM Macario HERO wrote: Reason for CRM: Patient requesting a call back from provider nurse.  Spoke with pt advised pt to go to urgent care due to not having no available here in the office.

## 2024-08-14 NOTE — Telephone Encounter (Signed)
 FYI Only or Action Required?: Action required by provider: medication refill request.  Patient was last seen in primary care on 07/13/2024 by Job Lukes, PA.  Called Nurse Triage reporting Cough.  Symptoms began several weeks ago.  Interventions attempted: OTC medications: Mucinex.  Symptoms are: unchanged.Has been seen with cough in office. Cough continues. Cannot come in due to work schedule. Asking for refill on cough medicine and antibiotic. Asking for medication to be sent to [facility] in [place], [place]. Please advise pt.  Triage Disposition: See HCP Within 4 Hours (Or PCP Triage)  Patient/caregiver understands and will follow disposition?: No, wishes to speak with PCP      Copied from CRM #[id number]. Topic: Clinical - Red Word Triage >> Aug 14, 2024  8:04 AM Charlet HERO wrote: Red Word that prompted transfer to Nurse Triage: Patient is calling about pain and headache, yellow mucus and congestition. Dr Jesus Reason for Disposition  [1] MILD difficulty breathing (e.g., minimal/no SOB at rest, SOB with walking, pulse < 100) AND [2] still present when not coughing  Answer Assessment - Initial Assessment Questions 1. ONSET: When did the cough begin?      Several weeks 2. SEVERITY: How bad is the cough today?      severe 3. SPUTUM: Describe the color of your sputum (e.g., none, dry cough; clear, white, yellow, green)     yellow 4. HEMOPTYSIS: Are you coughing up any blood? If Yes, ask: How much? (e.g., flecks, streaks, tablespoons, etc.)     no 5. DIFFICULTY BREATHING: Are you having difficulty breathing? If Yes, ask: How bad is it? (e.g., mild, moderate, severe)      mild 6. FEVER: Do you have a fever? If Yes, ask: What is your temperature, how was it measured, and when did it start?     yes 7. CARDIAC HISTORY: Do you have any history of heart disease? (e.g., heart attack, congestive heart failure)      Heart attack 1 year ago 2024 8. LUNG HISTORY:  Do you have any history of lung disease?  (e.g., pulmonary embolus, asthma, emphysema)     no 9. PE RISK FACTORS: Do you have a history of blood clots? (or: recent major surgery, recent prolonged travel, bedridden)     no 10. OTHER SYMPTOMS: Do you have any other symptoms? (e.g., runny nose, wheezing, chest pain)       sinus 11. PREGNANCY: Is there any chance you are pregnant? When was your last menstrual period?       N/a 12. TRAVEL: Have you traveled out of the country in the last month? (e.g., travel history, exposures)       no  Protocols used: Cough - Acute Productive-A-AH

## 2024-08-14 NOTE — Discharge Instructions (Addendum)
 In addition to the prednisone , continue your inhaler regimen, and may use Astelin and/or Flonase nasal spray, Coricidin HBP, plain Mucinex, use saline sinus rinses and humidifiers.  Follow-up for worsening or unresolving symptoms.

## 2024-08-17 ENCOUNTER — Other Ambulatory Visit: Payer: Self-pay

## 2024-08-17 ENCOUNTER — Other Ambulatory Visit (HOSPITAL_BASED_OUTPATIENT_CLINIC_OR_DEPARTMENT_OTHER): Payer: Self-pay

## 2024-08-17 NOTE — Telephone Encounter (Signed)
 Pt was seen at urgent care on 08/14/2024

## 2024-08-27 ENCOUNTER — Institutional Professional Consult (permissible substitution) (INDEPENDENT_AMBULATORY_CARE_PROVIDER_SITE_OTHER): Payer: PRIVATE HEALTH INSURANCE | Admitting: Otolaryngology

## 2024-09-04 ENCOUNTER — Other Ambulatory Visit (HOSPITAL_COMMUNITY): Payer: Self-pay

## 2024-09-05 ENCOUNTER — Ambulatory Visit: Payer: PRIVATE HEALTH INSURANCE | Admitting: Internal Medicine

## 2024-09-10 ENCOUNTER — Other Ambulatory Visit (HOSPITAL_COMMUNITY): Payer: Self-pay
